# Patient Record
Sex: Male | Born: 1935 | Race: White | Hispanic: No | Smoking: Never smoker
Health system: Southern US, Community
[De-identification: ages and names within clinical notes are randomized; demographics above are authoritative.]

## PROBLEM LIST (undated history)

## (undated) DIAGNOSIS — H9190 Unspecified hearing loss, unspecified ear: Secondary | ICD-10-CM

## (undated) DIAGNOSIS — I10 Essential (primary) hypertension: Secondary | ICD-10-CM

## (undated) HISTORY — PX: CLOSED REDUCTION FEMORAL SHAFT FRACTURE: SUR217

---

## 2014-07-04 DIAGNOSIS — M545 Low back pain: Secondary | ICD-10-CM | POA: Diagnosis not present

## 2014-07-04 DIAGNOSIS — M47896 Other spondylosis, lumbar region: Secondary | ICD-10-CM | POA: Diagnosis not present

## 2014-07-04 DIAGNOSIS — M5416 Radiculopathy, lumbar region: Secondary | ICD-10-CM | POA: Diagnosis not present

## 2014-07-11 DIAGNOSIS — M5116 Intervertebral disc disorders with radiculopathy, lumbar region: Secondary | ICD-10-CM | POA: Diagnosis not present

## 2014-07-11 DIAGNOSIS — M4806 Spinal stenosis, lumbar region: Secondary | ICD-10-CM | POA: Diagnosis not present

## 2014-07-11 DIAGNOSIS — M5125 Other intervertebral disc displacement, thoracolumbar region: Secondary | ICD-10-CM | POA: Diagnosis not present

## 2014-07-27 DIAGNOSIS — M4806 Spinal stenosis, lumbar region: Secondary | ICD-10-CM | POA: Diagnosis not present

## 2014-07-27 DIAGNOSIS — M545 Low back pain: Secondary | ICD-10-CM | POA: Diagnosis not present

## 2014-07-27 DIAGNOSIS — M47896 Other spondylosis, lumbar region: Secondary | ICD-10-CM | POA: Diagnosis not present

## 2014-07-27 DIAGNOSIS — M5416 Radiculopathy, lumbar region: Secondary | ICD-10-CM | POA: Diagnosis not present

## 2014-07-31 DIAGNOSIS — M546 Pain in thoracic spine: Secondary | ICD-10-CM | POA: Diagnosis not present

## 2014-07-31 DIAGNOSIS — M5116 Intervertebral disc disorders with radiculopathy, lumbar region: Secondary | ICD-10-CM | POA: Diagnosis not present

## 2014-07-31 DIAGNOSIS — M47816 Spondylosis without myelopathy or radiculopathy, lumbar region: Secondary | ICD-10-CM | POA: Diagnosis not present

## 2014-07-31 DIAGNOSIS — S338XXA Sprain of other parts of lumbar spine and pelvis, initial encounter: Secondary | ICD-10-CM | POA: Diagnosis not present

## 2014-07-31 DIAGNOSIS — M5441 Lumbago with sciatica, right side: Secondary | ICD-10-CM | POA: Diagnosis not present

## 2014-07-31 DIAGNOSIS — M5136 Other intervertebral disc degeneration, lumbar region: Secondary | ICD-10-CM | POA: Diagnosis not present

## 2014-07-31 DIAGNOSIS — M9903 Segmental and somatic dysfunction of lumbar region: Secondary | ICD-10-CM | POA: Diagnosis not present

## 2014-08-01 DIAGNOSIS — M47816 Spondylosis without myelopathy or radiculopathy, lumbar region: Secondary | ICD-10-CM | POA: Diagnosis not present

## 2014-08-01 DIAGNOSIS — S338XXA Sprain of other parts of lumbar spine and pelvis, initial encounter: Secondary | ICD-10-CM | POA: Diagnosis not present

## 2014-08-01 DIAGNOSIS — M5116 Intervertebral disc disorders with radiculopathy, lumbar region: Secondary | ICD-10-CM | POA: Diagnosis not present

## 2014-08-01 DIAGNOSIS — M546 Pain in thoracic spine: Secondary | ICD-10-CM | POA: Diagnosis not present

## 2014-08-01 DIAGNOSIS — M9903 Segmental and somatic dysfunction of lumbar region: Secondary | ICD-10-CM | POA: Diagnosis not present

## 2014-08-01 DIAGNOSIS — M5136 Other intervertebral disc degeneration, lumbar region: Secondary | ICD-10-CM | POA: Diagnosis not present

## 2014-08-01 DIAGNOSIS — M5441 Lumbago with sciatica, right side: Secondary | ICD-10-CM | POA: Diagnosis not present

## 2014-08-03 DIAGNOSIS — S338XXA Sprain of other parts of lumbar spine and pelvis, initial encounter: Secondary | ICD-10-CM | POA: Diagnosis not present

## 2014-08-03 DIAGNOSIS — M9903 Segmental and somatic dysfunction of lumbar region: Secondary | ICD-10-CM | POA: Diagnosis not present

## 2014-08-03 DIAGNOSIS — M5116 Intervertebral disc disorders with radiculopathy, lumbar region: Secondary | ICD-10-CM | POA: Diagnosis not present

## 2014-08-03 DIAGNOSIS — M5136 Other intervertebral disc degeneration, lumbar region: Secondary | ICD-10-CM | POA: Diagnosis not present

## 2014-08-03 DIAGNOSIS — M47816 Spondylosis without myelopathy or radiculopathy, lumbar region: Secondary | ICD-10-CM | POA: Diagnosis not present

## 2014-08-03 DIAGNOSIS — M5441 Lumbago with sciatica, right side: Secondary | ICD-10-CM | POA: Diagnosis not present

## 2014-08-03 DIAGNOSIS — M546 Pain in thoracic spine: Secondary | ICD-10-CM | POA: Diagnosis not present

## 2014-08-07 DIAGNOSIS — M47816 Spondylosis without myelopathy or radiculopathy, lumbar region: Secondary | ICD-10-CM | POA: Diagnosis not present

## 2014-08-07 DIAGNOSIS — M9903 Segmental and somatic dysfunction of lumbar region: Secondary | ICD-10-CM | POA: Diagnosis not present

## 2014-08-07 DIAGNOSIS — M5116 Intervertebral disc disorders with radiculopathy, lumbar region: Secondary | ICD-10-CM | POA: Diagnosis not present

## 2014-08-07 DIAGNOSIS — M5441 Lumbago with sciatica, right side: Secondary | ICD-10-CM | POA: Diagnosis not present

## 2014-10-26 DIAGNOSIS — M9903 Segmental and somatic dysfunction of lumbar region: Secondary | ICD-10-CM | POA: Diagnosis not present

## 2014-10-26 DIAGNOSIS — M47816 Spondylosis without myelopathy or radiculopathy, lumbar region: Secondary | ICD-10-CM | POA: Diagnosis not present

## 2014-10-26 DIAGNOSIS — M5116 Intervertebral disc disorders with radiculopathy, lumbar region: Secondary | ICD-10-CM | POA: Diagnosis not present

## 2014-10-26 DIAGNOSIS — M5136 Other intervertebral disc degeneration, lumbar region: Secondary | ICD-10-CM | POA: Diagnosis not present

## 2014-10-26 DIAGNOSIS — M5441 Lumbago with sciatica, right side: Secondary | ICD-10-CM | POA: Diagnosis not present

## 2014-10-30 DIAGNOSIS — M5416 Radiculopathy, lumbar region: Secondary | ICD-10-CM | POA: Diagnosis not present

## 2014-10-30 DIAGNOSIS — M5431 Sciatica, right side: Secondary | ICD-10-CM | POA: Diagnosis not present

## 2014-10-30 DIAGNOSIS — M4800 Spinal stenosis, site unspecified: Secondary | ICD-10-CM | POA: Diagnosis not present

## 2014-10-31 DIAGNOSIS — M5116 Intervertebral disc disorders with radiculopathy, lumbar region: Secondary | ICD-10-CM | POA: Diagnosis not present

## 2014-10-31 DIAGNOSIS — M47816 Spondylosis without myelopathy or radiculopathy, lumbar region: Secondary | ICD-10-CM | POA: Diagnosis not present

## 2014-10-31 DIAGNOSIS — M9903 Segmental and somatic dysfunction of lumbar region: Secondary | ICD-10-CM | POA: Diagnosis not present

## 2014-10-31 DIAGNOSIS — S338XXA Sprain of other parts of lumbar spine and pelvis, initial encounter: Secondary | ICD-10-CM | POA: Diagnosis not present

## 2014-10-31 DIAGNOSIS — M5441 Lumbago with sciatica, right side: Secondary | ICD-10-CM | POA: Diagnosis not present

## 2014-10-31 DIAGNOSIS — M5136 Other intervertebral disc degeneration, lumbar region: Secondary | ICD-10-CM | POA: Diagnosis not present

## 2014-11-07 DIAGNOSIS — M5441 Lumbago with sciatica, right side: Secondary | ICD-10-CM | POA: Diagnosis not present

## 2014-11-07 DIAGNOSIS — M9903 Segmental and somatic dysfunction of lumbar region: Secondary | ICD-10-CM | POA: Diagnosis not present

## 2014-11-07 DIAGNOSIS — S338XXA Sprain of other parts of lumbar spine and pelvis, initial encounter: Secondary | ICD-10-CM | POA: Diagnosis not present

## 2014-11-07 DIAGNOSIS — M5136 Other intervertebral disc degeneration, lumbar region: Secondary | ICD-10-CM | POA: Diagnosis not present

## 2014-11-07 DIAGNOSIS — M47816 Spondylosis without myelopathy or radiculopathy, lumbar region: Secondary | ICD-10-CM | POA: Diagnosis not present

## 2014-11-07 DIAGNOSIS — M5116 Intervertebral disc disorders with radiculopathy, lumbar region: Secondary | ICD-10-CM | POA: Diagnosis not present

## 2014-11-09 DIAGNOSIS — M5441 Lumbago with sciatica, right side: Secondary | ICD-10-CM | POA: Diagnosis not present

## 2014-11-09 DIAGNOSIS — M5136 Other intervertebral disc degeneration, lumbar region: Secondary | ICD-10-CM | POA: Diagnosis not present

## 2014-11-09 DIAGNOSIS — M47816 Spondylosis without myelopathy or radiculopathy, lumbar region: Secondary | ICD-10-CM | POA: Diagnosis not present

## 2014-11-09 DIAGNOSIS — M9903 Segmental and somatic dysfunction of lumbar region: Secondary | ICD-10-CM | POA: Diagnosis not present

## 2014-11-09 DIAGNOSIS — S338XXA Sprain of other parts of lumbar spine and pelvis, initial encounter: Secondary | ICD-10-CM | POA: Diagnosis not present

## 2014-11-09 DIAGNOSIS — M5116 Intervertebral disc disorders with radiculopathy, lumbar region: Secondary | ICD-10-CM | POA: Diagnosis not present

## 2014-11-14 DIAGNOSIS — M5116 Intervertebral disc disorders with radiculopathy, lumbar region: Secondary | ICD-10-CM | POA: Diagnosis not present

## 2014-11-14 DIAGNOSIS — M47816 Spondylosis without myelopathy or radiculopathy, lumbar region: Secondary | ICD-10-CM | POA: Diagnosis not present

## 2014-11-14 DIAGNOSIS — M5136 Other intervertebral disc degeneration, lumbar region: Secondary | ICD-10-CM | POA: Diagnosis not present

## 2014-11-14 DIAGNOSIS — M5441 Lumbago with sciatica, right side: Secondary | ICD-10-CM | POA: Diagnosis not present

## 2014-11-14 DIAGNOSIS — M9903 Segmental and somatic dysfunction of lumbar region: Secondary | ICD-10-CM | POA: Diagnosis not present

## 2014-11-14 DIAGNOSIS — S338XXA Sprain of other parts of lumbar spine and pelvis, initial encounter: Secondary | ICD-10-CM | POA: Diagnosis not present

## 2014-11-16 DIAGNOSIS — M9903 Segmental and somatic dysfunction of lumbar region: Secondary | ICD-10-CM | POA: Diagnosis not present

## 2014-11-16 DIAGNOSIS — S338XXA Sprain of other parts of lumbar spine and pelvis, initial encounter: Secondary | ICD-10-CM | POA: Diagnosis not present

## 2014-11-16 DIAGNOSIS — M5116 Intervertebral disc disorders with radiculopathy, lumbar region: Secondary | ICD-10-CM | POA: Diagnosis not present

## 2014-11-16 DIAGNOSIS — M546 Pain in thoracic spine: Secondary | ICD-10-CM | POA: Diagnosis not present

## 2014-11-16 DIAGNOSIS — M5441 Lumbago with sciatica, right side: Secondary | ICD-10-CM | POA: Diagnosis not present

## 2014-11-16 DIAGNOSIS — M47816 Spondylosis without myelopathy or radiculopathy, lumbar region: Secondary | ICD-10-CM | POA: Diagnosis not present

## 2014-11-16 DIAGNOSIS — M5136 Other intervertebral disc degeneration, lumbar region: Secondary | ICD-10-CM | POA: Diagnosis not present

## 2014-11-21 DIAGNOSIS — M9903 Segmental and somatic dysfunction of lumbar region: Secondary | ICD-10-CM | POA: Diagnosis not present

## 2014-11-21 DIAGNOSIS — M5116 Intervertebral disc disorders with radiculopathy, lumbar region: Secondary | ICD-10-CM | POA: Diagnosis not present

## 2014-11-21 DIAGNOSIS — M5136 Other intervertebral disc degeneration, lumbar region: Secondary | ICD-10-CM | POA: Diagnosis not present

## 2014-11-21 DIAGNOSIS — M5441 Lumbago with sciatica, right side: Secondary | ICD-10-CM | POA: Diagnosis not present

## 2014-11-21 DIAGNOSIS — S338XXA Sprain of other parts of lumbar spine and pelvis, initial encounter: Secondary | ICD-10-CM | POA: Diagnosis not present

## 2014-11-21 DIAGNOSIS — M47816 Spondylosis without myelopathy or radiculopathy, lumbar region: Secondary | ICD-10-CM | POA: Diagnosis not present

## 2014-11-21 DIAGNOSIS — M546 Pain in thoracic spine: Secondary | ICD-10-CM | POA: Diagnosis not present

## 2014-11-23 DIAGNOSIS — S338XXA Sprain of other parts of lumbar spine and pelvis, initial encounter: Secondary | ICD-10-CM | POA: Diagnosis not present

## 2014-11-23 DIAGNOSIS — M5441 Lumbago with sciatica, right side: Secondary | ICD-10-CM | POA: Diagnosis not present

## 2014-11-23 DIAGNOSIS — M47816 Spondylosis without myelopathy or radiculopathy, lumbar region: Secondary | ICD-10-CM | POA: Diagnosis not present

## 2014-11-23 DIAGNOSIS — M546 Pain in thoracic spine: Secondary | ICD-10-CM | POA: Diagnosis not present

## 2014-11-23 DIAGNOSIS — M5136 Other intervertebral disc degeneration, lumbar region: Secondary | ICD-10-CM | POA: Diagnosis not present

## 2014-11-23 DIAGNOSIS — M9903 Segmental and somatic dysfunction of lumbar region: Secondary | ICD-10-CM | POA: Diagnosis not present

## 2014-11-23 DIAGNOSIS — M5116 Intervertebral disc disorders with radiculopathy, lumbar region: Secondary | ICD-10-CM | POA: Diagnosis not present

## 2014-12-05 DIAGNOSIS — M5136 Other intervertebral disc degeneration, lumbar region: Secondary | ICD-10-CM | POA: Diagnosis not present

## 2014-12-05 DIAGNOSIS — M9903 Segmental and somatic dysfunction of lumbar region: Secondary | ICD-10-CM | POA: Diagnosis not present

## 2014-12-05 DIAGNOSIS — M5441 Lumbago with sciatica, right side: Secondary | ICD-10-CM | POA: Diagnosis not present

## 2014-12-05 DIAGNOSIS — M47816 Spondylosis without myelopathy or radiculopathy, lumbar region: Secondary | ICD-10-CM | POA: Diagnosis not present

## 2014-12-05 DIAGNOSIS — M546 Pain in thoracic spine: Secondary | ICD-10-CM | POA: Diagnosis not present

## 2014-12-05 DIAGNOSIS — S338XXA Sprain of other parts of lumbar spine and pelvis, initial encounter: Secondary | ICD-10-CM | POA: Diagnosis not present

## 2014-12-05 DIAGNOSIS — M5116 Intervertebral disc disorders with radiculopathy, lumbar region: Secondary | ICD-10-CM | POA: Diagnosis not present

## 2014-12-07 DIAGNOSIS — S338XXA Sprain of other parts of lumbar spine and pelvis, initial encounter: Secondary | ICD-10-CM | POA: Diagnosis not present

## 2014-12-07 DIAGNOSIS — M5136 Other intervertebral disc degeneration, lumbar region: Secondary | ICD-10-CM | POA: Diagnosis not present

## 2014-12-07 DIAGNOSIS — M5441 Lumbago with sciatica, right side: Secondary | ICD-10-CM | POA: Diagnosis not present

## 2014-12-07 DIAGNOSIS — M9903 Segmental and somatic dysfunction of lumbar region: Secondary | ICD-10-CM | POA: Diagnosis not present

## 2014-12-07 DIAGNOSIS — M47816 Spondylosis without myelopathy or radiculopathy, lumbar region: Secondary | ICD-10-CM | POA: Diagnosis not present

## 2014-12-07 DIAGNOSIS — M5116 Intervertebral disc disorders with radiculopathy, lumbar region: Secondary | ICD-10-CM | POA: Diagnosis not present

## 2014-12-07 DIAGNOSIS — M546 Pain in thoracic spine: Secondary | ICD-10-CM | POA: Diagnosis not present

## 2014-12-12 DIAGNOSIS — M5136 Other intervertebral disc degeneration, lumbar region: Secondary | ICD-10-CM | POA: Diagnosis not present

## 2014-12-12 DIAGNOSIS — S338XXA Sprain of other parts of lumbar spine and pelvis, initial encounter: Secondary | ICD-10-CM | POA: Diagnosis not present

## 2014-12-12 DIAGNOSIS — M5441 Lumbago with sciatica, right side: Secondary | ICD-10-CM | POA: Diagnosis not present

## 2014-12-12 DIAGNOSIS — M5116 Intervertebral disc disorders with radiculopathy, lumbar region: Secondary | ICD-10-CM | POA: Diagnosis not present

## 2014-12-12 DIAGNOSIS — M9903 Segmental and somatic dysfunction of lumbar region: Secondary | ICD-10-CM | POA: Diagnosis not present

## 2014-12-12 DIAGNOSIS — M546 Pain in thoracic spine: Secondary | ICD-10-CM | POA: Diagnosis not present

## 2014-12-12 DIAGNOSIS — M47816 Spondylosis without myelopathy or radiculopathy, lumbar region: Secondary | ICD-10-CM | POA: Diagnosis not present

## 2015-06-21 DIAGNOSIS — R0602 Shortness of breath: Secondary | ICD-10-CM | POA: Diagnosis not present

## 2015-06-21 DIAGNOSIS — I509 Heart failure, unspecified: Secondary | ICD-10-CM | POA: Diagnosis not present

## 2015-11-17 DIAGNOSIS — M545 Low back pain: Secondary | ICD-10-CM | POA: Diagnosis not present

## 2015-11-17 DIAGNOSIS — M4806 Spinal stenosis, lumbar region: Secondary | ICD-10-CM | POA: Diagnosis not present

## 2015-11-17 DIAGNOSIS — M5441 Lumbago with sciatica, right side: Secondary | ICD-10-CM | POA: Diagnosis not present

## 2015-11-17 DIAGNOSIS — R030 Elevated blood-pressure reading, without diagnosis of hypertension: Secondary | ICD-10-CM | POA: Diagnosis not present

## 2016-01-10 DIAGNOSIS — M545 Low back pain: Secondary | ICD-10-CM | POA: Diagnosis not present

## 2016-01-10 DIAGNOSIS — Z6830 Body mass index (BMI) 30.0-30.9, adult: Secondary | ICD-10-CM | POA: Diagnosis not present

## 2016-01-10 DIAGNOSIS — M5431 Sciatica, right side: Secondary | ICD-10-CM | POA: Diagnosis not present

## 2016-01-10 DIAGNOSIS — I10 Essential (primary) hypertension: Secondary | ICD-10-CM | POA: Diagnosis not present

## 2016-02-29 ENCOUNTER — Other Ambulatory Visit: Payer: Self-pay | Admitting: Internal Medicine

## 2016-02-29 DIAGNOSIS — G8929 Other chronic pain: Secondary | ICD-10-CM

## 2016-02-29 DIAGNOSIS — M545 Low back pain: Principal | ICD-10-CM

## 2016-03-12 ENCOUNTER — Ambulatory Visit
Admission: RE | Admit: 2016-03-12 | Discharge: 2016-03-12 | Disposition: A | Discharge status: Home / Self Care | Payer: Medicare Other | Source: Ambulatory Visit | Attending: Internal Medicine | Admitting: Internal Medicine

## 2016-03-12 DIAGNOSIS — G8929 Other chronic pain: Secondary | ICD-10-CM

## 2016-03-12 DIAGNOSIS — M545 Low back pain, unspecified: Secondary | ICD-10-CM

## 2016-03-12 DIAGNOSIS — M47817 Spondylosis without myelopathy or radiculopathy, lumbosacral region: Secondary | ICD-10-CM | POA: Diagnosis not present

## 2016-03-12 MED ORDER — IOPAMIDOL (ISOVUE-M 200) INJECTION 41%
1.0000 mL | Freq: Once | INTRAMUSCULAR | Status: AC
  Administered 2016-03-12: 1 mL via EPIDURAL

## 2016-03-12 MED ORDER — METHYLPREDNISOLONE ACETATE 40 MG/ML INJ SUSP (RADIOLOG
120.0000 mg | Freq: Once | INTRAMUSCULAR | Status: AC
  Administered 2016-03-12: 120 mg via EPIDURAL

## 2016-03-12 NOTE — Discharge Instructions (Signed)

## 2016-06-10 ENCOUNTER — Other Ambulatory Visit: Payer: Self-pay | Admitting: Internal Medicine

## 2016-06-10 DIAGNOSIS — G8929 Other chronic pain: Secondary | ICD-10-CM

## 2016-06-10 DIAGNOSIS — M545 Low back pain: Principal | ICD-10-CM

## 2016-06-20 ENCOUNTER — Ambulatory Visit
Admission: RE | Admit: 2016-06-20 | Discharge: 2016-06-20 | Disposition: A | Discharge status: Home / Self Care | Payer: Medicare Other | Source: Ambulatory Visit | Attending: Internal Medicine | Admitting: Internal Medicine

## 2016-06-20 DIAGNOSIS — M545 Low back pain: Principal | ICD-10-CM

## 2016-06-20 DIAGNOSIS — G8929 Other chronic pain: Secondary | ICD-10-CM

## 2016-06-20 MED ORDER — IOPAMIDOL (ISOVUE-M 200) INJECTION 41%
1.0000 mL | Freq: Once | INTRAMUSCULAR | Status: AC
  Administered 2016-06-20: 1 mL via EPIDURAL

## 2016-06-20 MED ORDER — METHYLPREDNISOLONE ACETATE 40 MG/ML INJ SUSP (RADIOLOG
120.0000 mg | Freq: Once | INTRAMUSCULAR | Status: AC
  Administered 2016-06-20: 120 mg via EPIDURAL

## 2016-06-20 NOTE — Discharge Instructions (Signed)

## 2016-07-31 DIAGNOSIS — M5431 Sciatica, right side: Secondary | ICD-10-CM | POA: Diagnosis not present

## 2016-07-31 DIAGNOSIS — M545 Low back pain: Secondary | ICD-10-CM | POA: Diagnosis not present

## 2016-07-31 DIAGNOSIS — I10 Essential (primary) hypertension: Secondary | ICD-10-CM | POA: Diagnosis not present

## 2016-07-31 DIAGNOSIS — Z6830 Body mass index (BMI) 30.0-30.9, adult: Secondary | ICD-10-CM | POA: Diagnosis not present

## 2016-09-10 ENCOUNTER — Other Ambulatory Visit: Payer: Self-pay | Admitting: Internal Medicine

## 2016-09-10 DIAGNOSIS — M545 Low back pain: Secondary | ICD-10-CM

## 2016-09-15 ENCOUNTER — Ambulatory Visit
Admission: RE | Admit: 2016-09-15 | Discharge: 2016-09-15 | Disposition: A | Discharge status: Home / Self Care | Payer: Medicare Other | Source: Ambulatory Visit | Attending: Internal Medicine | Admitting: Internal Medicine

## 2016-09-15 DIAGNOSIS — M545 Low back pain: Secondary | ICD-10-CM

## 2016-09-15 DIAGNOSIS — M47817 Spondylosis without myelopathy or radiculopathy, lumbosacral region: Secondary | ICD-10-CM | POA: Diagnosis not present

## 2016-09-15 MED ORDER — IOPAMIDOL (ISOVUE-M 200) INJECTION 41%
1.0000 mL | Freq: Once | INTRAMUSCULAR | Status: AC
  Administered 2016-09-15: 1 mL via EPIDURAL

## 2016-09-15 MED ORDER — METHYLPREDNISOLONE ACETATE 40 MG/ML INJ SUSP (RADIOLOG
120.0000 mg | Freq: Once | INTRAMUSCULAR | Status: AC
  Administered 2016-09-15: 120 mg via EPIDURAL

## 2016-09-15 NOTE — Discharge Instructions (Signed)

## 2016-11-14 DIAGNOSIS — M25562 Pain in left knee: Secondary | ICD-10-CM | POA: Diagnosis not present

## 2016-11-14 DIAGNOSIS — B358 Other dermatophytoses: Secondary | ICD-10-CM | POA: Diagnosis not present

## 2016-11-14 DIAGNOSIS — Z6829 Body mass index (BMI) 29.0-29.9, adult: Secondary | ICD-10-CM | POA: Diagnosis not present

## 2016-11-14 DIAGNOSIS — I10 Essential (primary) hypertension: Secondary | ICD-10-CM | POA: Diagnosis not present

## 2016-12-05 DIAGNOSIS — M545 Low back pain: Secondary | ICD-10-CM | POA: Diagnosis not present

## 2016-12-05 DIAGNOSIS — L03113 Cellulitis of right upper limb: Secondary | ICD-10-CM | POA: Diagnosis not present

## 2016-12-05 DIAGNOSIS — F411 Generalized anxiety disorder: Secondary | ICD-10-CM | POA: Diagnosis present

## 2016-12-05 DIAGNOSIS — Z79899 Other long term (current) drug therapy: Secondary | ICD-10-CM | POA: Diagnosis not present

## 2016-12-05 DIAGNOSIS — L02511 Cutaneous abscess of right hand: Secondary | ICD-10-CM | POA: Diagnosis not present

## 2016-12-05 DIAGNOSIS — Z823 Family history of stroke: Secondary | ICD-10-CM | POA: Diagnosis not present

## 2016-12-05 DIAGNOSIS — G8929 Other chronic pain: Secondary | ICD-10-CM | POA: Diagnosis not present

## 2016-12-05 DIAGNOSIS — S61201A Unspecified open wound of left index finger without damage to nail, initial encounter: Secondary | ICD-10-CM | POA: Diagnosis not present

## 2016-12-05 DIAGNOSIS — I10 Essential (primary) hypertension: Secondary | ICD-10-CM | POA: Diagnosis not present

## 2016-12-05 DIAGNOSIS — Z8042 Family history of malignant neoplasm of prostate: Secondary | ICD-10-CM | POA: Diagnosis not present

## 2016-12-16 DIAGNOSIS — G4739 Other sleep apnea: Secondary | ICD-10-CM | POA: Diagnosis not present

## 2016-12-16 DIAGNOSIS — L02511 Cutaneous abscess of right hand: Secondary | ICD-10-CM | POA: Diagnosis not present

## 2017-01-14 DIAGNOSIS — G4730 Sleep apnea, unspecified: Secondary | ICD-10-CM | POA: Diagnosis not present

## 2017-01-16 DIAGNOSIS — G4730 Sleep apnea, unspecified: Secondary | ICD-10-CM | POA: Diagnosis not present

## 2017-02-12 DIAGNOSIS — G4733 Obstructive sleep apnea (adult) (pediatric): Secondary | ICD-10-CM | POA: Diagnosis not present

## 2017-05-07 DIAGNOSIS — I10 Essential (primary) hypertension: Secondary | ICD-10-CM | POA: Diagnosis not present

## 2017-05-07 DIAGNOSIS — G4739 Other sleep apnea: Secondary | ICD-10-CM | POA: Diagnosis not present

## 2017-09-16 DIAGNOSIS — I10 Essential (primary) hypertension: Secondary | ICD-10-CM | POA: Diagnosis not present

## 2017-09-16 DIAGNOSIS — G4739 Other sleep apnea: Secondary | ICD-10-CM | POA: Diagnosis not present

## 2017-09-16 DIAGNOSIS — M25552 Pain in left hip: Secondary | ICD-10-CM | POA: Diagnosis not present

## 2017-12-24 DIAGNOSIS — Z0000 Encounter for general adult medical examination without abnormal findings: Secondary | ICD-10-CM | POA: Diagnosis not present

## 2017-12-24 DIAGNOSIS — Z125 Encounter for screening for malignant neoplasm of prostate: Secondary | ICD-10-CM | POA: Diagnosis not present

## 2017-12-24 DIAGNOSIS — M25552 Pain in left hip: Secondary | ICD-10-CM | POA: Diagnosis not present

## 2017-12-24 DIAGNOSIS — I10 Essential (primary) hypertension: Secondary | ICD-10-CM | POA: Diagnosis not present

## 2017-12-24 DIAGNOSIS — G4739 Other sleep apnea: Secondary | ICD-10-CM | POA: Diagnosis not present

## 2017-12-24 DIAGNOSIS — L043 Acute lymphadenitis of lower limb: Secondary | ICD-10-CM | POA: Diagnosis not present

## 2018-06-12 DIAGNOSIS — J019 Acute sinusitis, unspecified: Secondary | ICD-10-CM | POA: Diagnosis not present

## 2018-06-12 DIAGNOSIS — Z0131 Encounter for examination of blood pressure with abnormal findings: Secondary | ICD-10-CM | POA: Diagnosis not present

## 2018-06-12 DIAGNOSIS — J209 Acute bronchitis, unspecified: Secondary | ICD-10-CM | POA: Diagnosis not present

## 2018-06-27 DIAGNOSIS — K5792 Diverticulitis of intestine, part unspecified, without perforation or abscess without bleeding: Secondary | ICD-10-CM | POA: Diagnosis not present

## 2018-06-27 DIAGNOSIS — M160 Bilateral primary osteoarthritis of hip: Secondary | ICD-10-CM | POA: Diagnosis present

## 2018-06-27 DIAGNOSIS — Z79899 Other long term (current) drug therapy: Secondary | ICD-10-CM | POA: Diagnosis not present

## 2018-06-27 DIAGNOSIS — G8929 Other chronic pain: Secondary | ICD-10-CM | POA: Diagnosis not present

## 2018-06-27 DIAGNOSIS — K449 Diaphragmatic hernia without obstruction or gangrene: Secondary | ICD-10-CM | POA: Diagnosis not present

## 2018-06-27 DIAGNOSIS — R911 Solitary pulmonary nodule: Secondary | ICD-10-CM | POA: Diagnosis present

## 2018-06-27 DIAGNOSIS — M25551 Pain in right hip: Secondary | ICD-10-CM | POA: Diagnosis not present

## 2018-06-27 DIAGNOSIS — I10 Essential (primary) hypertension: Secondary | ICD-10-CM | POA: Diagnosis not present

## 2018-06-27 DIAGNOSIS — K5732 Diverticulitis of large intestine without perforation or abscess without bleeding: Secondary | ICD-10-CM | POA: Diagnosis not present

## 2018-06-27 DIAGNOSIS — K5720 Diverticulitis of large intestine with perforation and abscess without bleeding: Secondary | ICD-10-CM | POA: Diagnosis not present

## 2018-06-27 DIAGNOSIS — K8020 Calculus of gallbladder without cholecystitis without obstruction: Secondary | ICD-10-CM | POA: Diagnosis not present

## 2018-07-15 DIAGNOSIS — K5741 Diverticulitis of both small and large intestine with perforation and abscess with bleeding: Secondary | ICD-10-CM | POA: Diagnosis not present

## 2018-11-11 DIAGNOSIS — M545 Low back pain: Secondary | ICD-10-CM | POA: Diagnosis not present

## 2019-01-31 ENCOUNTER — Other Ambulatory Visit: Payer: Self-pay

## 2019-03-09 DIAGNOSIS — E782 Mixed hyperlipidemia: Secondary | ICD-10-CM | POA: Diagnosis not present

## 2019-03-09 DIAGNOSIS — I10 Essential (primary) hypertension: Secondary | ICD-10-CM | POA: Diagnosis not present

## 2019-03-09 DIAGNOSIS — M545 Low back pain: Secondary | ICD-10-CM | POA: Diagnosis not present

## 2019-03-22 DIAGNOSIS — M545 Low back pain: Secondary | ICD-10-CM | POA: Diagnosis not present

## 2019-03-22 DIAGNOSIS — E782 Mixed hyperlipidemia: Secondary | ICD-10-CM | POA: Diagnosis not present

## 2019-03-22 DIAGNOSIS — M1711 Unilateral primary osteoarthritis, right knee: Secondary | ICD-10-CM | POA: Diagnosis not present

## 2019-03-22 DIAGNOSIS — I10 Essential (primary) hypertension: Secondary | ICD-10-CM | POA: Diagnosis not present

## 2019-03-25 ENCOUNTER — Other Ambulatory Visit: Payer: Self-pay | Admitting: Internal Medicine

## 2019-03-25 DIAGNOSIS — M545 Low back pain, unspecified: Secondary | ICD-10-CM

## 2019-03-25 DIAGNOSIS — I10 Essential (primary) hypertension: Secondary | ICD-10-CM | POA: Diagnosis not present

## 2019-03-25 DIAGNOSIS — M1711 Unilateral primary osteoarthritis, right knee: Secondary | ICD-10-CM | POA: Diagnosis not present

## 2019-03-25 DIAGNOSIS — E782 Mixed hyperlipidemia: Secondary | ICD-10-CM | POA: Diagnosis not present

## 2019-03-25 DIAGNOSIS — Z131 Encounter for screening for diabetes mellitus: Secondary | ICD-10-CM | POA: Diagnosis not present

## 2019-03-25 DIAGNOSIS — G8929 Other chronic pain: Secondary | ICD-10-CM

## 2019-03-25 DIAGNOSIS — Z125 Encounter for screening for malignant neoplasm of prostate: Secondary | ICD-10-CM | POA: Diagnosis not present

## 2019-03-31 ENCOUNTER — Ambulatory Visit
Admission: RE | Admit: 2019-03-31 | Discharge: 2019-03-31 | Disposition: A | Discharge status: Home / Self Care | Payer: Medicare Other | Source: Ambulatory Visit | Attending: Internal Medicine | Admitting: Internal Medicine

## 2019-03-31 DIAGNOSIS — G8929 Other chronic pain: Secondary | ICD-10-CM

## 2019-03-31 DIAGNOSIS — M545 Low back pain, unspecified: Secondary | ICD-10-CM

## 2019-03-31 MED ORDER — IOPAMIDOL (ISOVUE-M 200) INJECTION 41%
1.0000 mL | Freq: Once | INTRAMUSCULAR | Status: AC
  Administered 2019-03-31: 15:00:00 1 mL via EPIDURAL

## 2019-03-31 MED ORDER — METHYLPREDNISOLONE ACETATE 40 MG/ML INJ SUSP (RADIOLOG
120.0000 mg | Freq: Once | INTRAMUSCULAR | Status: AC
  Administered 2019-03-31: 15:00:00 120 mg via EPIDURAL

## 2019-03-31 NOTE — Discharge Instructions (Signed)

## 2019-04-11 DIAGNOSIS — M25561 Pain in right knee: Secondary | ICD-10-CM | POA: Diagnosis not present

## 2019-04-11 DIAGNOSIS — M25562 Pain in left knee: Secondary | ICD-10-CM | POA: Diagnosis not present

## 2019-05-12 DIAGNOSIS — M545 Low back pain: Secondary | ICD-10-CM | POA: Diagnosis not present

## 2019-05-12 DIAGNOSIS — M1711 Unilateral primary osteoarthritis, right knee: Secondary | ICD-10-CM | POA: Diagnosis not present

## 2019-05-12 DIAGNOSIS — N5201 Erectile dysfunction due to arterial insufficiency: Secondary | ICD-10-CM | POA: Diagnosis not present

## 2019-05-12 DIAGNOSIS — I10 Essential (primary) hypertension: Secondary | ICD-10-CM | POA: Diagnosis not present

## 2019-05-12 DIAGNOSIS — M25561 Pain in right knee: Secondary | ICD-10-CM | POA: Diagnosis not present

## 2019-05-12 DIAGNOSIS — E782 Mixed hyperlipidemia: Secondary | ICD-10-CM | POA: Diagnosis not present

## 2019-05-19 ENCOUNTER — Ambulatory Visit: Payer: Self-pay | Admitting: Physician Assistant

## 2019-05-19 DIAGNOSIS — I10 Essential (primary) hypertension: Secondary | ICD-10-CM | POA: Diagnosis not present

## 2019-05-19 DIAGNOSIS — Z1389 Encounter for screening for other disorder: Secondary | ICD-10-CM | POA: Diagnosis not present

## 2019-05-19 DIAGNOSIS — Z0000 Encounter for general adult medical examination without abnormal findings: Secondary | ICD-10-CM | POA: Diagnosis not present

## 2019-05-19 NOTE — H&P (Signed)
TOTAL KNEE ADMISSION H&P  Patient is being admitted for right total knee arthroplasty.  Subjective:  Chief Complaint:right knee pain.  HPI: Devin [name], 83 y.o. male, has a history of pain and functional disability in the right knee due to arthritis and has failed non-surgical conservative treatments for greater than 12 weeks to includeNSAID's and/or analgesics, corticosteriod injections, use of assistive devices and activity modification.  Onset of symptoms was gradual, starting >10 years ago with gradually worsening course since that time. The patient noted no past surgery on the right knee(s).  Patient currently rates pain in the right knee(s) at 10 out of 10 with activity. Patient has night pain, worsening of pain with activity and weight bearing, pain that interferes with activities of daily living, pain with passive range of motion, crepitus and joint swelling.  Patient has evidence of subchondral cysts, subchondral sclerosis, periarticular osteophytes, joint subluxation and joint space narrowing by imaging studies.  There is no active infection.  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 amLODipine (NORVASC) 5 MG tablet Take 5 mg by mouth daily.     . chlorthalidone (HYGROTON) 25 MG tablet Take 25 mg by mouth daily.     Marland Kitchen HYDROcodone-acetaminophen (NORCO/VICODIN) 5-325 MG tablet Take 1 tablet by mouth every 6 (six) hours as needed.     . metoprolol succinate (TOPROL-XL) 100 MG 24 hr tablet Take 100 mg by mouth daily.      No current facility-administered medications for this visit.    No Known Allergies  Social History   Tobacco Use  . Smoking status: Not on file  Substance Use Topics  . Alcohol use: Not on file    No family history on file.   Review of Systems   Musculoskeletal: Positive for joint pain.  All other systems reviewed and are negative.   Objective:  Physical Exam  Constitutional: He is oriented to person, place, and time. He appears well-developed and well-nourished. No distress.  HENT:  Head: Normocephalic and atraumatic.  Eyes: Pupils are equal, round, and reactive to light. Conjunctivae and EOM are normal.  Neck: Normal range of motion. Neck supple.  Cardiovascular: Normal rate, regular rhythm, normal heart sounds and intact distal pulses.  Respiratory: Effort normal and breath sounds normal. No respiratory distress. He has no wheezes.  GI: Soft. Bowel sounds are normal. He exhibits no distension. There is no abdominal tenderness.  Musculoskeletal:     Right knee: He exhibits decreased range of motion, swelling, deformity and bony tenderness. He exhibits no erythema. Tenderness found.  Lymphadenopathy:    He has no cervical adenopathy.  Neurological: He is alert and oriented to person, place, and time.  Skin: Skin is warm and dry. No rash noted. No erythema.  Psychiatric: He has a normal mood and affect. His behavior is normal.    Vital signs in last 24 hours: @VSRANGES @  Labs:   There is no height or weight on file to calculate BMI.   Imaging Review Plain radiographs demonstrate severe degenerative joint disease of the right knee(s). The overall alignment issignificant valgus. The bone quality appears to be good for age and reported activity level.      Assessment/Plan:  End stage arthritis, right knee   The patient history, physical examination, clinical judgment of the provider and  imaging studies are consistent with end stage degenerative joint disease of the right knee(s) and total knee arthroplasty is deemed medically necessary. The treatment options including medical management, injection therapy arthroscopy and arthroplasty were discussed at length. The risks and benefits of total knee arthroplasty were  presented and reviewed. The risks due to aseptic loosening, infection, stiffness, patella tracking problems, thromboembolic complications and other imponderables were discussed. The patient acknowledged the explanation, agreed to proceed with the plan and consent was signed. Patient is being admitted for inpatient treatment for surgery, pain control, PT, OT, prophylactic antibiotics, VTE prophylaxis, progressive ambulation and ADL's and discharge planning. The patient is planning to be discharged home with home health services    Anticipated LOS equal to or greater than 2 midnights due to - Age 19 and older with one or more of the following:  - Obesity  - Expected need for hospital services (PT, OT, Nursing) required for safe  discharge  - Anticipated need for postoperative skilled nursing care or inpatient rehab  - Active co-morbidities: None OR   - Unanticipated findings during/Post Surgery: None  - Patient is a high risk of re-admission due to: None

## 2019-05-19 NOTE — H&P (View-Only) (Signed)
TOTAL KNEE ADMISSION H&P  Patient is being admitted for right total knee arthroplasty.  Subjective:  Chief Complaint:right knee pain.  HPI: Devin [name], 83 y.o. male, has a history of pain and functional disability in the right knee due to arthritis and has failed non-surgical conservative treatments for greater than 12 weeks to includeNSAID's and/or analgesics, corticosteriod injections, use of assistive devices and activity modification.  Onset of symptoms was gradual, starting >10 years ago with gradually worsening course since that time. The patient noted no past surgery on the right knee(s).  Patient currently rates pain in the right knee(s) at 10 out of 10 with activity. Patient has night pain, worsening of pain with activity and weight bearing, pain that interferes with activities of daily living, pain with passive range of motion, crepitus and joint swelling.  Patient has evidence of subchondral cysts, subchondral sclerosis, periarticular osteophytes, joint subluxation and joint space narrowing by imaging studies.  There is no active infection.  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 amLODipine (NORVASC) 5 MG tablet Take 5 mg by mouth daily.     . chlorthalidone (HYGROTON) 25 MG tablet Take 25 mg by mouth daily.     Marland Kitchen HYDROcodone-acetaminophen (NORCO/VICODIN) 5-325 MG tablet Take 1 tablet by mouth every 6 (six) hours as needed.     . metoprolol succinate (TOPROL-XL) 100 MG 24 hr tablet Take 100 mg by mouth daily.      No current facility-administered medications for this visit.    No Known Allergies  Social History   Tobacco Use  . Smoking status: Not on file  Substance Use Topics  . Alcohol use: Not on file    No family history on file.   Review of Systems   Musculoskeletal: Positive for joint pain.  All other systems reviewed and are negative.   Objective:  Physical Exam  Constitutional: He is oriented to person, place, and time. He appears well-developed and well-nourished. No distress.  HENT:  Head: Normocephalic and atraumatic.  Eyes: Pupils are equal, round, and reactive to light. Conjunctivae and EOM are normal.  Neck: Normal range of motion. Neck supple.  Cardiovascular: Normal rate, regular rhythm, normal heart sounds and intact distal pulses.  Respiratory: Effort normal and breath sounds normal. No respiratory distress. He has no wheezes.  GI: Soft. Bowel sounds are normal. He exhibits no distension. There is no abdominal tenderness.  Musculoskeletal:     Right knee: He exhibits decreased range of motion, swelling, deformity and bony tenderness. He exhibits no erythema. Tenderness found.  Lymphadenopathy:    He has no cervical adenopathy.  Neurological: He is alert and oriented to person, place, and time.  Skin: Skin is warm and dry. No rash noted. No erythema.  Psychiatric: He has a normal mood and affect. His behavior is normal.    Vital signs in last 24 hours: @VSRANGES @  Labs:   There is no height or weight on file to calculate BMI.   Imaging Review Plain radiographs demonstrate severe degenerative joint disease of the right knee(s). The overall alignment issignificant valgus. The bone quality appears to be good for age and reported activity level.      Assessment/Plan:  End stage arthritis, right knee   The patient history, physical examination, clinical judgment of the provider and  imaging studies are consistent with end stage degenerative joint disease of the right knee(s) and total knee arthroplasty is deemed medically necessary. The treatment options including medical management, injection therapy arthroscopy and arthroplasty were discussed at length. The risks and benefits of total knee arthroplasty were  presented and reviewed. The risks due to aseptic loosening, infection, stiffness, patella tracking problems, thromboembolic complications and other imponderables were discussed. The patient acknowledged the explanation, agreed to proceed with the plan and consent was signed. Patient is being admitted for inpatient treatment for surgery, pain control, PT, OT, prophylactic antibiotics, VTE prophylaxis, progressive ambulation and ADL's and discharge planning. The patient is planning to be discharged home with home health services    Anticipated LOS equal to or greater than 2 midnights due to - Age 19 and older with one or more of the following:  - Obesity  - Expected need for hospital services (PT, OT, Nursing) required for safe  discharge  - Anticipated need for postoperative skilled nursing care or inpatient rehab  - Active co-morbidities: None OR   - Unanticipated findings during/Post Surgery: None  - Patient is a high risk of re-admission due to: None

## 2019-05-24 NOTE — Patient Instructions (Addendum)
DUE TO COVID-19 ONLY ONE VISITOR IS ALLOWED TO COME WITH YOU AND STAY IN THE WAITING ROOM ONLY DURING PRE OP AND PROCEDURE. THE ONE VISITOR MAY VISIT WITH YOU IN YOUR PRIVATE ROOM DURING VISITING HOURS ONLY!!   COVID SWAB TESTING MUST BE COMPLETED ON:  12/01/202 @ 2:35 PM at [facility] in [place], [place]   (Must self quarantine after testing. Follow instructions on handout.)              Your procedure is scheduled on: 06/03/2019   Report to [facility] Main  Entrance   Report to Short Stay at: 5:30 AM    Call this number if you have problems the morning of surgery [phone]  NO SOLID FOOD AFTER MIDNIGHT THE NIGHT PRIOR TO SURGERY. NOTHING BY MOUTH EXCEPT CLEAR LIQUIDS UNTIL 4:30 AM . PLEASE FINISH ENSURE DRINK PER SURGEON ORDER  WHICH NEEDS TO BE COMPLETED AT 4:30 AM .   CLEAR LIQUID DIET   Foods Allowed                                                                     Foods Excluded  Coffee and tea, regular and decaf                             liquids that you cannot  Plain Jell-O any favor except red or purple                                           see through such as: Fruit ices (not with fruit pulp)                                     milk, soups, orange juice  Iced Popsicles                                    All solid food Carbonated beverages, regular and diet                                    Cranberry, grape and apple juices Sports drinks like Gatorade Lightly seasoned clear broth or consume(fat free) Sugar, honey syrup  Sample Menu Breakfast                                Lunch                                     Supper Cranberry juice                    Beef broth  Chicken broth Jell-O                                     Grape juice                           Apple juice Coffee or tea                        Jell-O                                      Popsicle                                                Coffee or  tea                        Coffee or tea  _____________________________________________________________________    Brush your teeth the morning of surgery.   Do NOT smoke after Midnight   Take these medicines the morning of surgery with A SIP OF WATER: Amlodipine and Metropolol              You may not have any metal on your body including jewelry, and body piercings              Do not wear lotions, powders, perfumes/cologne, or deodorant              Men may shave face and neck.   Do not bring valuables to the hospital. [facility] IS NOT             RESPONSIBLE   FOR VALUABLES.   Contacts, dentures or bridgework may not be worn into surgery.   Bring small overnight bag day of surgery.    Special Instructions: Bring a copy of your healthcare power of attorney and living will documents         the day of surgery if you haven't scanned them in before.                  - Preparing for Surgery Before surgery, you can play an important role.  Because skin is not sterile, your skin needs to be as free of germs as possible.  You can reduce the number of germs on your skin by washing with CHG (chlorahexidine gluconate) soap before surgery.  CHG is an antiseptic cleaner which kills germs and bonds with the skin to continue killing germs even after washing. Please DO NOT use if you have an allergy to CHG or antibacterial soaps.  If your skin becomes reddened/irritated stop using the CHG and inform your nurse when you arrive at Short Stay. Do not shave (including legs and underarms) for at least 48 hours prior to the first CHG shower.  You may shave your face/neck. Please follow these instructions carefully:  1.  Shower with CHG Soap the night before surgery and the  morning of Surgery.  2.  If you choose to wash your hair, wash your hair first as usual with your  normal  shampoo.  3.  After you shampoo, rinse your  hair and body thoroughly to remove the  shampoo.                            4.  Use CHG as you would any other liquid soap.  You can apply chg directly  to the skin and wash                       Gently with a scrungie or clean washcloth.  5.  Apply the CHG Soap to your body ONLY FROM THE NECK DOWN.   Do not use on face/ open                           Wound or open sores. Avoid contact with eyes, ears mouth and genitals (private parts).                       Wash face,  Genitals (private parts) with your normal soap.             6.  Wash thoroughly, paying special attention to the area where your surgery  will be performed.  7.  Thoroughly rinse your body with warm water from the neck down.  8.  DO NOT shower/wash with your normal soap after using and rinsing off  the CHG Soap.                9.  Pat yourself dry with a clean towel.            10.  Wear clean pajamas.            11.  Place clean sheets on your bed the night of your first shower and do not  sleep with pets. Day of Surgery : Do not apply any lotions/deodorants the morning of surgery.  Please wear clean clothes to the hospital/surgery center.  FAILURE TO FOLLOW THESE INSTRUCTIONS MAY RESULT IN THE CANCELLATION OF YOUR SURGERY PATIENT SIGNATURE_________________________________  NURSE SIGNATURE__________________________________  ________________________________________________________________________   Adam Phenix  An incentive spirometer is a tool that can help keep your lungs clear and active. This tool measures how well you are filling your lungs with each breath. Taking long deep breaths may help reverse or decrease the chance of developing breathing (pulmonary) problems (especially infection) following:  A long period of time when you are unable to move or be active. BEFORE THE PROCEDURE   If the spirometer includes an indicator to show your best effort, your nurse or respiratory therapist will set it to a desired goal.  If possible, sit up straight or lean slightly  forward. Try not to slouch.  Hold the incentive spirometer in an upright position. INSTRUCTIONS FOR USE  1. Sit on the edge of your bed if possible, or sit up as far as you can in bed or on a chair. 2. Hold the incentive spirometer in an upright position. 3. Breathe out normally. 4. Place the mouthpiece in your mouth and seal your lips tightly around it. 5. Breathe in slowly and as deeply as possible, raising the piston or the ball toward the top of the column. 6. Hold your breath for 3-5 seconds or for as long as possible. Allow the piston or ball to fall to the bottom of the column. 7. Remove the mouthpiece from your mouth and breathe out normally. 8. Rest for a few seconds and repeat  Steps 1 through 7 at least 10 times every 1-2 hours when you are awake. Take your time and take a few normal breaths between deep breaths. 9. The spirometer may include an indicator to show your best effort. Use the indicator as a goal to work toward during each repetition. 10. After each set of 10 deep breaths, practice coughing to be sure your lungs are clear. If you have an incision (the cut made at the time of surgery), support your incision when coughing by placing a pillow or rolled up towels firmly against it. Once you are able to get out of bed, walk around indoors and cough well. You may stop using the incentive spirometer when instructed by your caregiver.  RISKS AND COMPLICATIONS  Take your time so you do not get dizzy or light-headed.  If you are in pain, you may need to take or ask for pain medication before doing incentive spirometry. It is harder to take a deep breath if you are having pain. AFTER USE  Rest and breathe slowly and easily.  It can be helpful to keep track of a log of your progress. Your caregiver can provide you with a simple table to help with this. If you are using the spirometer at home, follow these instructions: SEEK MEDICAL CARE IF:   You are having difficultly using the  spirometer.  You have trouble using the spirometer as often as instructed.  Your pain medication is not giving enough relief while using the spirometer.  You develop fever of 100.5 F (38.1 C) or higher. SEEK IMMEDIATE MEDICAL CARE IF:   You cough up bloody sputum that had not been present before.  You develop fever of 102 F (38.9 C) or greater.  You develop worsening pain at or near the incision site. MAKE SURE YOU:   Understand these instructions.  Will watch your condition.  Will get help right away if you are not doing well or get worse. Document Released: 10/27/2006 Document Revised: 09/08/2011 Document Reviewed: 12/28/2006 [facility] Patient Information 2014 [place], [place].  PLEASE BRING CPAP MASK AND  TUBING ONLY. DEVICE WILL BE PROVIDED! _ WHAT IS A BLOOD TRANSFUSION? Blood Transfusion Information  A transfusion is the replacement of blood or some of its parts. Blood is made up of multiple cells which provide different functions.  Red blood cells carry oxygen and are used for blood loss replacement.  White blood cells fight against infection.  Platelets control bleeding.  Plasma helps clot blood.  Other blood products are available for specialized needs, such as hemophilia or other clotting disorders. BEFORE THE TRANSFUSION  Who gives blood for transfusions?   Healthy volunteers who are fully evaluated to make sure their blood is safe. This is blood bank blood. Transfusion therapy is the safest it has ever been in the practice of medicine. Before blood is taken from a donor, a complete history is taken to make sure that person has no history of diseases nor engages in risky social behavior (examples are intravenous drug use or sexual activity with multiple partners). The donor's travel history is screened to minimize risk of transmitting infections, such as malaria. The donated blood is tested for signs of infectious diseases, such as HIV and hepatitis. The blood  is then tested to be sure it is compatible with you in order to minimize the chance of a transfusion reaction. If you or a relative donates blood, this is often done in anticipation of surgery and is not appropriate for emergency  situations. It takes many days to process the donated blood. RISKS AND COMPLICATIONS Although transfusion therapy is very safe and saves many lives, the main dangers of transfusion include:   Getting an infectious disease.  Developing a transfusion reaction. This is an allergic reaction to something in the blood you were given. Every precaution is taken to prevent this. The decision to have a blood transfusion has been considered carefully by your caregiver before blood is given. Blood is not given unless the benefits outweigh the risks. AFTER THE TRANSFUSION  Right after receiving a blood transfusion, you will usually feel much better and more energetic. This is especially true if your red blood cells have gotten low (anemic). The transfusion raises the level of the red blood cells which carry oxygen, and this usually causes an energy increase.  The nurse administering the transfusion will monitor you carefully for complications. HOME CARE INSTRUCTIONS  No special instructions are needed after a transfusion. You may find your energy is better. Speak with your caregiver about any limitations on activity for underlying diseases you may have. SEEK MEDICAL CARE IF:   Your condition is not improving after your transfusion.  You develop redness or irritation at the intravenous (IV) site. SEEK IMMEDIATE MEDICAL CARE IF:  Any of the following symptoms occur over the next 12 hours:  Shaking chills.  You have a temperature by mouth above 102 F (38.9 C), not controlled by medicine.  Chest, back, or muscle pain.  People around you feel you are not acting correctly or are confused.  Shortness of breath or difficulty breathing.  Dizziness and fainting.  You get a  rash or develop hives.  You have a decrease in urine output.  Your urine turns a dark color or changes to pink, red, or brown. Any of the following symptoms occur over the next 10 days:  You have a temperature by mouth above 102 F (38.9 C), not controlled by medicine.  Shortness of breath.  Weakness after normal activity.  The white part of the eye turns yellow (jaundice).  You have a decrease in the amount of urine or are urinating less often.  Your urine turns a dark color or changes to pink, red, or brown. Document Released: 06/13/2000 Document Revised: 09/08/2011 Document Reviewed: 01/31/2008 [facility] Patient Information 2014 [place], [place].  ___________________________________________________________________________________________________________________

## 2019-05-25 ENCOUNTER — Other Ambulatory Visit: Payer: Self-pay

## 2019-05-25 ENCOUNTER — Encounter (HOSPITAL_COMMUNITY): Payer: Self-pay

## 2019-05-25 ENCOUNTER — Encounter (HOSPITAL_COMMUNITY)
Admission: RE | Admit: 2019-05-25 | Discharge: 2019-05-25 | Disposition: A | Discharge status: Home / Self Care | Payer: Medicare Other | Source: Ambulatory Visit | Attending: Orthopedic Surgery | Admitting: Orthopedic Surgery

## 2019-05-25 DIAGNOSIS — I10 Essential (primary) hypertension: Secondary | ICD-10-CM | POA: Diagnosis not present

## 2019-05-25 DIAGNOSIS — M1711 Unilateral primary osteoarthritis, right knee: Secondary | ICD-10-CM | POA: Diagnosis not present

## 2019-05-25 DIAGNOSIS — Z01818 Encounter for other preprocedural examination: Secondary | ICD-10-CM | POA: Diagnosis not present

## 2019-05-25 HISTORY — DX: Essential (primary) hypertension: I10

## 2019-05-25 LAB — CBC WITH DIFFERENTIAL/PLATELET
Abs Immature Granulocytes: 0.03 10*3/uL (ref 0.00–0.07)
Basophils Absolute: 0 10*3/uL (ref 0.0–0.1)
Basophils Relative: 0 %
Eosinophils Absolute: 0.1 10*3/uL (ref 0.0–0.5)
Eosinophils Relative: 2 %
HCT: 44.3 % (ref 39.0–52.0)
Hemoglobin: 14.8 g/dL (ref 13.0–17.0)
Immature Granulocytes: 1 %
Lymphocytes Relative: 26 %
Lymphs Abs: 1.5 10*3/uL (ref 0.7–4.0)
MCH: 33.1 pg (ref 26.0–34.0)
MCHC: 33.4 g/dL (ref 30.0–36.0)
MCV: 99.1 fL (ref 80.0–100.0)
Monocytes Absolute: 0.4 10*3/uL (ref 0.1–1.0)
Monocytes Relative: 8 %
Neutro Abs: 3.6 10*3/uL (ref 1.7–7.7)
Neutrophils Relative %: 63 %
Platelets: 168 10*3/uL (ref 150–400)
RBC: 4.47 MIL/uL (ref 4.22–5.81)
RDW: 13.7 % (ref 11.5–15.5)
WBC: 5.7 10*3/uL (ref 4.0–10.5)
nRBC: 0 % (ref 0.0–0.2)

## 2019-05-25 LAB — COMPREHENSIVE METABOLIC PANEL
ALT: 28 U/L (ref 0–44)
AST: 26 U/L (ref 15–41)
Albumin: 4.1 g/dL (ref 3.5–5.0)
Alkaline Phosphatase: 89 U/L (ref 38–126)
Anion gap: 10 (ref 5–15)
BUN: 16 mg/dL (ref 8–23)
CO2: 27 mmol/L (ref 22–32)
Calcium: 9.5 mg/dL (ref 8.9–10.3)
Chloride: 101 mmol/L (ref 98–111)
Creatinine, Ser: 1.31 mg/dL — ABNORMAL HIGH (ref 0.61–1.24)
GFR calc Af Amer: 59 mL/min — ABNORMAL LOW (ref >60)
GFR calc non Af Amer: 51 mL/min — ABNORMAL LOW (ref >60)
Glucose, Bld: 110 mg/dL — ABNORMAL HIGH (ref 70–99)
Potassium: 3.9 mmol/L (ref 3.5–5.1)
Sodium: 138 mmol/L (ref 135–145)
Total Bilirubin: 1 mg/dL (ref 0.3–1.2)
Total Protein: 6.8 g/dL (ref 6.5–8.1)

## 2019-05-25 LAB — APTT: aPTT: 31 seconds (ref 24–36)

## 2019-05-25 LAB — PROTIME-INR
INR: 1 (ref 0.8–1.2)
Prothrombin Time: 12.9 seconds (ref 11.4–15.2)

## 2019-05-25 LAB — SURGICAL PCR SCREEN
MRSA, PCR: NEGATIVE
Staphylococcus aureus: NEGATIVE

## 2019-05-25 LAB — ABO/RH: ABO/RH(D): O NEG

## 2019-05-30 ENCOUNTER — Telehealth: Payer: Self-pay

## 2019-05-30 ENCOUNTER — Other Ambulatory Visit: Payer: Self-pay

## 2019-05-30 ENCOUNTER — Encounter: Payer: Self-pay | Admitting: Cardiology

## 2019-05-30 ENCOUNTER — Ambulatory Visit (INDEPENDENT_AMBULATORY_CARE_PROVIDER_SITE_OTHER): Payer: Medicare Other | Admitting: Cardiology

## 2019-05-30 VITALS — BP 148/90 | HR 66 | Temp 97.6°F | Ht 66.0 in | Wt 177.0 lb

## 2019-05-30 DIAGNOSIS — Z01810 Encounter for preprocedural cardiovascular examination: Secondary | ICD-10-CM | POA: Diagnosis not present

## 2019-05-30 DIAGNOSIS — R9431 Abnormal electrocardiogram [ECG] [EKG]: Secondary | ICD-10-CM

## 2019-05-30 DIAGNOSIS — I10 Essential (primary) hypertension: Secondary | ICD-10-CM

## 2019-05-30 NOTE — Addendum Note (Signed)
Addended by: Cristopher Estimable on: 05/30/2019 02:33 PM   Modules accepted: Orders

## 2019-05-30 NOTE — Progress Notes (Signed)
Devin Mulberry, MD Reason for referral-preoperative evaluation and abnormal electrocardiogram.  HPI: 83 year old male for evaluation of abnormal electrocardiogram and preoperative evaluation at request of Earlie Server, MD. Patient has right knee arthralgias and is scheduled for knee replacement on Friday.  He had a preoperative ECG that is abnormal and cardiology asked to evaluate.  He has limited mobility because of his knee.  He cannot ambulate significant distances because of that.  However what he is able to do he denies dyspnea, chest pain, pedal edema or syncope.  Current Outpatient Medications  Medication Sig Dispense Refill  . amLODipine (NORVASC) 5 MG tablet Take 5 mg by mouth daily.    . chlorthalidone (HYGROTON) 25 MG tablet Take 25 mg by mouth daily.    Marland Kitchen HYDROcodone-acetaminophen (NORCO/VICODIN) 5-325 MG tablet Take 1 tablet by mouth every 6 (six) hours as needed.    . metoprolol succinate (TOPROL-XL) 100 MG 24 hr tablet Take 100 mg by mouth daily.     No current facility-administered medications for this visit.     No Known Allergies   Past Medical History:  Diagnosis Date  . Hypertension   . Hypertension     Past Surgical History:  Procedure Laterality Date  . CLOSED REDUCTION FEMORAL SHAFT FRACTURE Left     Social History   Socioeconomic History  . Marital status: Widowed    Spouse name: Not on file  . Number of children: 2  . Years of education: Not on file  . Highest education level: Not on file  Occupational History  . Not on file  Social Needs  . Financial resource strain: Not on file  . Food insecurity    Worry: Not on file    Inability: Not on file  . Transportation needs    Medical: Not on file    Non-medical: Not on file  Tobacco Use  . Smoking status: Never Smoker  . Smokeless tobacco: Never Used  Substance and Sexual Activity  . Alcohol use: Never    Frequency: Never  . Drug use: Never  . Sexual activity: Not on file   Lifestyle  . Physical activity    Days per week: Not on file    Minutes per session: Not on file  . Stress: Not on file  Relationships  . Social Herbalist on phone: Not on file    Gets together: Not on file    Attends religious service: Not on file    Active member of club or organization: Not on file    Attends meetings of clubs or organizations: Not on file    Relationship status: Not on file  . Intimate partner violence    Fear of current or ex partner: Not on file    Emotionally abused: Not on file    Physically abused: Not on file    Forced sexual activity: Not on file  Other Topics Concern  . Not on file  Social History Narrative  . Not on file    Family History  Problem Relation Age of Onset  . Stroke Mother     ROS: Right knee arthralgias but no fevers or chills, productive cough, hemoptysis, dysphasia, odynophagia, melena, hematochezia, dysuria, hematuria, rash, seizure activity, orthopnea, PND, pedal edema, claudication. Remaining systems are negative.  Physical Exam:   Blood pressure (!) 148/90, pulse 66, temperature 97.6 F (36.4 C), height 5\' 6"  (1.676 m), weight 177 lb (80.3 kg).  General:  Well developed/well nourished in NAD Skin  warm/dry Patient not depressed No peripheral clubbing Back-normal HEENT-normal/normal eyelids Neck supple/normal carotid upstroke bilaterally; no bruits; no JVD; no thyromegaly chest - CTA/ normal expansion CV - RRR/normal S1 and S2; no murmurs, rubs or gallops;  PMI nondisplaced Abdomen -NT/ND, no HSM, no mass, + bowel sounds, no bruit 2+ femoral pulses, no bruits Ext-no edema, chords, 2+ DP Neuro-grossly nonfocal  ECG -May 25, 2019 sinus bradycardia, anterior lateral T wave inversion.  Personally reviewed  A/P  1 abnormal electrocardiogram-patient's electrocardiogram shows sinus rhythm with anterior lateral  T wave inversions.  We will arrange Lexiscan nuclear study to further assess.  2 preoperative  evaluation prior to right knee replacement-patient has limited mobility because of knee issues and therefore symptoms with activities cannot be assessed.  His electrocardiogram is abnormal.  He also has hypertension.  We will arrange a Lexiscan nuclear study for risk stratification preoperatively.  If no ischemia he may proceed.  3 hypertension-patient's blood pressure is borderline.  He will continue on his present medications and we will follow.  Olga Millers, MD

## 2019-05-30 NOTE — Patient Instructions (Signed)
Medication Instructions:  NO CHANGE *If you need a refill on your cardiac medications before your next appointment, please call your pharmacy*  Lab Work: If you have labs (blood work) drawn today and your tests are completely normal, you will receive your results only by: Marland Kitchen MyChart Message (if you have MyChart) OR . A paper copy in the mail If you have any lab test that is abnormal or we need to change your treatment, we will call you to review the results.  Testing/Procedures: Your physician has requested that you have a lexiscan myoview. For further information please visit [email or web address]. Please follow instruction sheet, as given.AT Devin [name]: At [facility], you and your health needs are our priority.  As part of our continuing mission to provide you with exceptional heart care, we have created designated Provider Care Teams.  These Care Teams include your primary Cardiologist (physician) and Advanced Practice Providers (APPs -  Physician Assistants and Nurse Practitioners) who all work together to provide you with the care you need, when you need it.  Your physician recommends that you schedule a follow-up appointment in: AS NEEDED PENDING TEST RESULTS

## 2019-05-30 NOTE — Telephone Encounter (Signed)
   [facility] Pre-operative Risk Assessment    Request for surgical clearance:  1. What type of surgery is being performed? Right Total Knee Replacement  2. When is this surgery scheduled? 06/01/19  3. What type of clearance is required (medical clearance vs. Pharmacy clearance to hold med vs. Both)? Medical  4. Are there any medications that need to be held prior to surgery and how long? None  5. Practice name and name of physician performing surgery? [facility]  6. What is your office phone number [phone]   7.   What is your office fax number [phone]  8.   Anesthesia type    Not listed   Kathyrn Lass 05/30/2019, 4:13 PM  _________________________________________________________________   (provider comments below)

## 2019-05-30 NOTE — Telephone Encounter (Signed)
I s/w Sherri, surgery scheduler with [facility]. I went over notes from our pre op team pt was seen today by Dr. Stanford Breed today. Pt is scheduled for a Myoview 05/31/19. Notes from the pre op team , pt's surgery 12/2 may need to be rescheduled. I will fax these notes to [place] at [facility]. Sherri thanked me for the call.

## 2019-05-30 NOTE — Telephone Encounter (Signed)
   Primary Cardiologist: No primary care provider on file.  Chart reviewed as part of pre-operative protocol coverage. Patient was contacted 05/30/2019 in reference to pre-operative risk assessment for pending surgery as outlined below.  Devin [name] was seen on 05/30/2019 by Dr. Stanford Breed. He was noted to have an abnormal EKG and has limited mobility because of knee issues and therefore exertional symptoms cannot be assessed. He also has hypertension. We will arrange for [name] for risk stratification preoperatively. If no ischemia, he may proceed.   Due to the need for stress test, his surgery will likely need to be rescheduled.    Pre-op covering staff: - Please contact requesting surgeon's office via phone to inform them of need for stress test prior to surgery. Surgery will likely need to be rescheduled.  I will efax this to the requesting office.   Daune Perch, NP 05/30/2019, 4:28 PM

## 2019-05-31 ENCOUNTER — Other Ambulatory Visit (HOSPITAL_COMMUNITY): Payer: Medicare Other

## 2019-05-31 ENCOUNTER — Encounter (HOSPITAL_BASED_OUTPATIENT_CLINIC_OR_DEPARTMENT_OTHER)
Admission: RE | Admit: 2019-05-31 | Discharge: 2019-05-31 | Disposition: A | Discharge status: Home / Self Care | Payer: Medicare Other | Source: Ambulatory Visit | Attending: Cardiology | Admitting: Cardiology

## 2019-05-31 ENCOUNTER — Encounter (HOSPITAL_COMMUNITY)
Admission: RE | Admit: 2019-05-31 | Discharge: 2019-05-31 | Disposition: A | Discharge status: Home / Self Care | Payer: Medicare Other | Source: Ambulatory Visit | Attending: Internal Medicine | Admitting: Internal Medicine

## 2019-05-31 ENCOUNTER — Encounter (HOSPITAL_COMMUNITY): Payer: Self-pay

## 2019-05-31 ENCOUNTER — Other Ambulatory Visit (HOSPITAL_COMMUNITY)
Admission: RE | Admit: 2019-05-31 | Discharge: 2019-05-31 | Disposition: A | Discharge status: Home / Self Care | Payer: Medicare Other | Source: Ambulatory Visit | Attending: Orthopedic Surgery | Admitting: Orthopedic Surgery

## 2019-05-31 DIAGNOSIS — Z01812 Encounter for preprocedural laboratory examination: Secondary | ICD-10-CM | POA: Diagnosis not present

## 2019-05-31 DIAGNOSIS — R9431 Abnormal electrocardiogram [ECG] [EKG]: Secondary | ICD-10-CM | POA: Insufficient documentation

## 2019-05-31 DIAGNOSIS — Z20828 Contact with and (suspected) exposure to other viral communicable diseases: Secondary | ICD-10-CM | POA: Insufficient documentation

## 2019-05-31 LAB — NM MYOCAR MULTI W/SPECT W/WALL MOTION / EF
LV dias vol: 83 mL (ref 62–150)
LV sys vol: 37 mL
Peak HR: 93 {beats}/min
RATE: 0.25
Rest HR: 58 {beats}/min
SDS: 0
SRS: 0
SSS: 0
TID: 1.21

## 2019-05-31 LAB — SARS CORONAVIRUS 2 (TAT 6-24 HRS): SARS Coronavirus 2: NEGATIVE

## 2019-05-31 MED ORDER — SODIUM CHLORIDE FLUSH 0.9 % IV SOLN
INTRAVENOUS | Status: AC
  Administered 2019-05-31: 10 mL via INTRAVENOUS
  Filled 2019-05-31: qty 10

## 2019-05-31 MED ORDER — TECHNETIUM TC 99M TETROFOSMIN IV KIT
30.0000 | PACK | Freq: Once | INTRAVENOUS | Status: AC | PRN
  Administered 2019-05-31: 33 via INTRAVENOUS

## 2019-05-31 MED ORDER — REGADENOSON 0.4 MG/5ML IV SOLN
INTRAVENOUS | Status: AC
  Administered 2019-05-31: 0.4 mg via INTRAVENOUS
  Filled 2019-05-31: qty 5

## 2019-05-31 MED ORDER — TECHNETIUM TC 99M TETROFOSMIN IV KIT
10.0000 | PACK | Freq: Once | INTRAVENOUS | Status: AC | PRN
  Administered 2019-05-31: 10.5 via INTRAVENOUS

## 2019-05-31 NOTE — Telephone Encounter (Signed)
   Primary Cardiologist: Kirk Ruths, MD  Chart reviewed as part of pre-operative protocol coverage. Given past medical history and time since last visit, based on ACC/AHA guidelines, Devin [name] would be at acceptable risk for the planned procedure without further cardiovascular testing.   The patient underwent nuclear stress test today that showed no perfusion defects and is deemed low risk.   I will route this recommendation to the requesting party via Epic fax function and remove from pre-op pool.  Please call with questions.  Daune Perch, NP 05/31/2019, 2:58 PM

## 2019-06-02 ENCOUNTER — Ambulatory Visit: Payer: Medicare Other | Admitting: Cardiology

## 2019-06-02 MED ORDER — TRANEXAMIC ACID 1000 MG/10ML IV SOLN
2000.0000 mg | INTRAVENOUS | Status: DC
  Filled 2019-06-02: qty 20

## 2019-06-02 MED ORDER — BUPIVACAINE LIPOSOME 1.3 % IJ SUSP
20.0000 mL | Freq: Once | INTRAMUSCULAR | Status: DC
  Filled 2019-06-02: qty 20

## 2019-06-02 NOTE — Anesthesia Preprocedure Evaluation (Addendum)
Anesthesia Evaluation  Patient identified by MRN, date of birth, ID band Patient awake    Reviewed: Allergy & Precautions, NPO status , Patient's Chart, lab work & pertinent test results, reviewed documented beta blocker date and time   History of Anesthesia Complications Negative for: history of anesthetic complications  Airway Mallampati: II  TM Distance: >3 FB Neck ROM: Full    Dental  (+) Edentulous Upper, Edentulous Lower   Pulmonary neg pulmonary ROS,    Pulmonary exam normal        Cardiovascular hypertension, Pt. on home beta blockers and Pt. on medications Normal cardiovascular exam     Neuro/Psych negative neurological ROS  negative psych ROS   GI/Hepatic negative GI ROS, Neg liver ROS,   Endo/Other  negative endocrine ROS  Renal/GU negative Renal ROS  negative genitourinary   Musculoskeletal  (+) Arthritis , Osteoarthritis,    Abdominal   Peds  Hematology negative hematology ROS (+)   Anesthesia Other Findings Day of surgery medications reviewed with patient.  Reproductive/Obstetrics negative OB ROS                            Anesthesia Physical Anesthesia Plan  ASA: II  Anesthesia Plan: Spinal   Post-op Pain Management:  Regional for Post-op pain   Induction:   PONV Risk Score and Plan: 2 and Treatment may vary due to age or medical condition, Ondansetron, Propofol infusion and Dexamethasone  Airway Management Planned: Natural Airway and Simple Face Mask  Additional Equipment: None  Intra-op Plan:   Post-operative Plan:   Informed Consent: I have reviewed the patients History and Physical, chart, labs and discussed the procedure including the risks, benefits and alternatives for the proposed anesthesia with the patient or authorized representative who has indicated his/her understanding and acceptance.     Dental advisory given  Plan Discussed with:  CRNA  Anesthesia Plan Comments:        Anesthesia Quick Evaluation

## 2019-06-03 ENCOUNTER — Other Ambulatory Visit: Payer: Self-pay

## 2019-06-03 ENCOUNTER — Ambulatory Visit (HOSPITAL_COMMUNITY): Payer: Medicare Other | Admitting: Registered Nurse

## 2019-06-03 ENCOUNTER — Observation Stay (HOSPITAL_COMMUNITY)
Admission: RE | Admit: 2019-06-03 | Discharge: 2019-06-04 | Disposition: A | Discharge status: Home Health | Payer: Medicare Other | Attending: Orthopedic Surgery | Admitting: Orthopedic Surgery

## 2019-06-03 ENCOUNTER — Encounter (HOSPITAL_COMMUNITY): Payer: Self-pay

## 2019-06-03 ENCOUNTER — Ambulatory Visit (HOSPITAL_COMMUNITY): Payer: Medicare Other

## 2019-06-03 ENCOUNTER — Ambulatory Visit (HOSPITAL_COMMUNITY): Payer: Medicare Other | Admitting: Physician Assistant

## 2019-06-03 ENCOUNTER — Encounter (HOSPITAL_COMMUNITY)
Admission: RE | Disposition: A | Discharge status: Home Health | Payer: Self-pay | Source: Home / Self Care | Attending: Orthopedic Surgery

## 2019-06-03 DIAGNOSIS — Z6828 Body mass index (BMI) 28.0-28.9, adult: Secondary | ICD-10-CM | POA: Diagnosis not present

## 2019-06-03 DIAGNOSIS — E669 Obesity, unspecified: Secondary | ICD-10-CM | POA: Diagnosis not present

## 2019-06-03 DIAGNOSIS — M80051A Age-related osteoporosis with current pathological fracture, right femur, initial encounter for fracture: Secondary | ICD-10-CM | POA: Insufficient documentation

## 2019-06-03 DIAGNOSIS — M1711 Unilateral primary osteoarthritis, right knee: Secondary | ICD-10-CM | POA: Diagnosis not present

## 2019-06-03 DIAGNOSIS — G4733 Obstructive sleep apnea (adult) (pediatric): Secondary | ICD-10-CM | POA: Diagnosis not present

## 2019-06-03 DIAGNOSIS — Z79899 Other long term (current) drug therapy: Secondary | ICD-10-CM | POA: Diagnosis not present

## 2019-06-03 DIAGNOSIS — Z8249 Family history of ischemic heart disease and other diseases of the circulatory system: Secondary | ICD-10-CM | POA: Insufficient documentation

## 2019-06-03 DIAGNOSIS — Z471 Aftercare following joint replacement surgery: Secondary | ICD-10-CM | POA: Diagnosis not present

## 2019-06-03 DIAGNOSIS — M21061 Valgus deformity, not elsewhere classified, right knee: Secondary | ICD-10-CM | POA: Diagnosis not present

## 2019-06-03 DIAGNOSIS — Z96651 Presence of right artificial knee joint: Secondary | ICD-10-CM | POA: Diagnosis not present

## 2019-06-03 DIAGNOSIS — I10 Essential (primary) hypertension: Secondary | ICD-10-CM | POA: Diagnosis not present

## 2019-06-03 DIAGNOSIS — Z419 Encounter for procedure for purposes other than remedying health state, unspecified: Secondary | ICD-10-CM

## 2019-06-03 DIAGNOSIS — M24561 Contracture, right knee: Secondary | ICD-10-CM | POA: Insufficient documentation

## 2019-06-03 DIAGNOSIS — S72431A Displaced fracture of medial condyle of right femur, initial encounter for closed fracture: Secondary | ICD-10-CM | POA: Diagnosis not present

## 2019-06-03 DIAGNOSIS — G8918 Other acute postprocedural pain: Secondary | ICD-10-CM | POA: Diagnosis not present

## 2019-06-03 DIAGNOSIS — M2241 Chondromalacia patellae, right knee: Secondary | ICD-10-CM | POA: Diagnosis not present

## 2019-06-03 HISTORY — PX: TOTAL KNEE ARTHROPLASTY: SHX125

## 2019-06-03 LAB — TYPE AND SCREEN
ABO/RH(D): O NEG
Antibody Screen: NEGATIVE

## 2019-06-03 SURGERY — ARTHROPLASTY, KNEE, TOTAL
Anesthesia: Spinal | Site: Knee | Laterality: Right

## 2019-06-03 MED ORDER — OXYCODONE HCL 5 MG PO TABS: ORAL_TABLET | ORAL | 0 refills | Status: DC

## 2019-06-03 MED ORDER — LIDOCAINE 2% (20 MG/ML) 5 ML SYRINGE
INTRAMUSCULAR | Status: AC
  Filled 2019-06-03: qty 5

## 2019-06-03 MED ORDER — WATER FOR IRRIGATION, STERILE IR SOLN
Status: DC | PRN
  Administered 2019-06-03 (×2): 1000 mL

## 2019-06-03 MED ORDER — DEXAMETHASONE SODIUM PHOSPHATE 10 MG/ML IJ SOLN
INTRAMUSCULAR | Status: AC
  Filled 2019-06-03: qty 1

## 2019-06-03 MED ORDER — EPHEDRINE 5 MG/ML INJ
INTRAVENOUS | Status: AC
  Filled 2019-06-03: qty 10

## 2019-06-03 MED ORDER — DIPHENHYDRAMINE HCL 12.5 MG/5ML PO ELIX: 12.5000 mg | ORAL_SOLUTION | ORAL | Status: DC | PRN

## 2019-06-03 MED ORDER — HYDROMORPHONE HCL 1 MG/ML IJ SOLN: 0.5000 mg | INTRAMUSCULAR | Status: DC | PRN

## 2019-06-03 MED ORDER — PHENOL 1.4 % MT LIQD: 1.0000 | OROMUCOSAL | Status: DC | PRN

## 2019-06-03 MED ORDER — PROPOFOL 500 MG/50ML IV EMUL
INTRAVENOUS | Status: DC | PRN
  Administered 2019-06-03: 50 ug/kg/min via INTRAVENOUS

## 2019-06-03 MED ORDER — ACETAMINOPHEN 500 MG PO TABS
1000.0000 mg | ORAL_TABLET | Freq: Four times a day (QID) | ORAL | Status: AC
  Administered 2019-06-03 – 2019-06-04 (×3): 1000 mg via ORAL
  Filled 2019-06-03 (×3): qty 2

## 2019-06-03 MED ORDER — ACETAMINOPHEN 500 MG PO TABS
1000.0000 mg | ORAL_TABLET | Freq: Once | ORAL | Status: AC
  Administered 2019-06-03: 1000 mg via ORAL
  Filled 2019-06-03: qty 2

## 2019-06-03 MED ORDER — METOPROLOL SUCCINATE ER 50 MG PO TB24
100.0000 mg | ORAL_TABLET | Freq: Every day | ORAL | Status: DC
  Administered 2019-06-04: 100 mg via ORAL
  Filled 2019-06-03: qty 2

## 2019-06-03 MED ORDER — BUPIVACAINE LIPOSOME 1.3 % IJ SUSP
10.0000 mL | Freq: Once | INTRAMUSCULAR | Status: AC
  Administered 2019-06-03: 10 mL
  Filled 2019-06-03: qty 10

## 2019-06-03 MED ORDER — ONDANSETRON HCL 4 MG/2ML IJ SOLN
INTRAMUSCULAR | Status: DC | PRN
  Administered 2019-06-03: 4 mg via INTRAVENOUS

## 2019-06-03 MED ORDER — DEXAMETHASONE SODIUM PHOSPHATE 10 MG/ML IJ SOLN
INTRAMUSCULAR | Status: DC | PRN
  Administered 2019-06-03: 10 mg via INTRAVENOUS

## 2019-06-03 MED ORDER — GABAPENTIN 300 MG PO CAPS
300.0000 mg | ORAL_CAPSULE | Freq: Three times a day (TID) | ORAL | Status: DC
  Administered 2019-06-03 – 2019-06-04 (×3): 300 mg via ORAL
  Filled 2019-06-03 (×3): qty 1

## 2019-06-03 MED ORDER — ASPIRIN 81 MG PO CHEW
81.0000 mg | CHEWABLE_TABLET | Freq: Two times a day (BID) | ORAL | Status: DC
  Administered 2019-06-03 – 2019-06-04 (×2): 81 mg via ORAL
  Filled 2019-06-03 (×2): qty 1

## 2019-06-03 MED ORDER — ACETAMINOPHEN 325 MG PO TABS: 325.0000 mg | ORAL_TABLET | Freq: Four times a day (QID) | ORAL | Status: DC | PRN

## 2019-06-03 MED ORDER — OXYCODONE HCL 5 MG/5ML PO SOLN: 5.0000 mg | Freq: Once | ORAL | Status: DC | PRN

## 2019-06-03 MED ORDER — ACETAMINOPHEN 325 MG PO TABS: 650.0000 mg | ORAL_TABLET | ORAL | 2 refills | Status: AC | PRN

## 2019-06-03 MED ORDER — PROPOFOL 500 MG/50ML IV EMUL
INTRAVENOUS | Status: AC
  Filled 2019-06-03: qty 50

## 2019-06-03 MED ORDER — ONDANSETRON HCL 4 MG/2ML IJ SOLN
INTRAMUSCULAR | Status: AC
  Filled 2019-06-03: qty 2

## 2019-06-03 MED ORDER — SODIUM CHLORIDE 0.9% FLUSH
INTRAVENOUS | Status: DC | PRN
  Administered 2019-06-03: 30 mL

## 2019-06-03 MED ORDER — BUPIVACAINE-EPINEPHRINE (PF) 0.25% -1:200000 IJ SOLN
INTRAMUSCULAR | Status: DC | PRN
  Administered 2019-06-03: 30 mL

## 2019-06-03 MED ORDER — SODIUM CHLORIDE 0.9 % IR SOLN
Status: DC | PRN
  Administered 2019-06-03: 1000 mL

## 2019-06-03 MED ORDER — TRANEXAMIC ACID-NACL 1000-0.7 MG/100ML-% IV SOLN
1000.0000 mg | INTRAVENOUS | Status: AC
  Administered 2019-06-03: 1000 mg via INTRAVENOUS
  Filled 2019-06-03: qty 100

## 2019-06-03 MED ORDER — SODIUM CHLORIDE (PF) 0.9 % IJ SOLN
INTRAMUSCULAR | Status: AC
  Filled 2019-06-03: qty 50

## 2019-06-03 MED ORDER — PROMETHAZINE HCL 25 MG/ML IJ SOLN: 6.2500 mg | INTRAMUSCULAR | Status: DC | PRN

## 2019-06-03 MED ORDER — FENTANYL CITRATE (PF) 100 MCG/2ML IJ SOLN
INTRAMUSCULAR | Status: AC
  Filled 2019-06-03: qty 2

## 2019-06-03 MED ORDER — PROPOFOL 10 MG/ML IV BOLUS
INTRAVENOUS | Status: DC | PRN
  Administered 2019-06-03: 20 mg via INTRAVENOUS

## 2019-06-03 MED ORDER — FENTANYL CITRATE (PF) 100 MCG/2ML IJ SOLN: 25.0000 ug | INTRAMUSCULAR | Status: DC | PRN

## 2019-06-03 MED ORDER — MENTHOL 3 MG MT LOZG: 1.0000 | LOZENGE | OROMUCOSAL | Status: DC | PRN

## 2019-06-03 MED ORDER — LACTATED RINGERS IV SOLN
INTRAVENOUS | Status: DC | PRN
  Administered 2019-06-03: 09:00:00 via INTRAVENOUS

## 2019-06-03 MED ORDER — AMLODIPINE BESYLATE 5 MG PO TABS
5.0000 mg | ORAL_TABLET | Freq: Every day | ORAL | Status: DC
  Administered 2019-06-03 – 2019-06-04 (×2): 5 mg via ORAL
  Filled 2019-06-03 (×2): qty 1

## 2019-06-03 MED ORDER — GABAPENTIN 300 MG PO CAPS: ORAL_CAPSULE | ORAL | 0 refills | Status: DC

## 2019-06-03 MED ORDER — METOCLOPRAMIDE HCL 5 MG/ML IJ SOLN: 5.0000 mg | Freq: Three times a day (TID) | INTRAMUSCULAR | Status: DC | PRN

## 2019-06-03 MED ORDER — CEFAZOLIN SODIUM-DEXTROSE 1-4 GM/50ML-% IV SOLN
1.0000 g | Freq: Four times a day (QID) | INTRAVENOUS | Status: AC
  Administered 2019-06-03 (×2): 1 g via INTRAVENOUS
  Filled 2019-06-03 (×2): qty 50

## 2019-06-03 MED ORDER — BUPIVACAINE-EPINEPHRINE (PF) 0.5% -1:200000 IJ SOLN
INTRAMUSCULAR | Status: DC | PRN
  Administered 2019-06-03: 15 mL via PERINEURAL

## 2019-06-03 MED ORDER — SODIUM CHLORIDE 0.9 % IV SOLN: INTRAVENOUS | Status: DC

## 2019-06-03 MED ORDER — LIDOCAINE 2% (20 MG/ML) 5 ML SYRINGE
INTRAMUSCULAR | Status: DC | PRN
  Administered 2019-06-03: 40 mg via INTRAVENOUS

## 2019-06-03 MED ORDER — ASPIRIN EC 81 MG PO TBEC: 81.0000 mg | DELAYED_RELEASE_TABLET | Freq: Two times a day (BID) | ORAL | 0 refills | Status: AC

## 2019-06-03 MED ORDER — SODIUM CHLORIDE 0.9 % IV SOLN
INTRAVENOUS | Status: DC
  Administered 2019-06-03: 06:00:00 via INTRAVENOUS

## 2019-06-03 MED ORDER — OXYCODONE HCL 5 MG PO TABS
5.0000 mg | ORAL_TABLET | ORAL | Status: DC | PRN
  Administered 2019-06-03 (×2): 5 mg via ORAL
  Administered 2019-06-03: 10 mg via ORAL
  Administered 2019-06-04: 5 mg via ORAL
  Filled 2019-06-03: qty 2
  Filled 2019-06-03 (×3): qty 1

## 2019-06-03 MED ORDER — CEFAZOLIN SODIUM-DEXTROSE 2-4 GM/100ML-% IV SOLN
2.0000 g | INTRAVENOUS | Status: AC
  Administered 2019-06-03: 2 g via INTRAVENOUS
  Filled 2019-06-03: qty 100

## 2019-06-03 MED ORDER — OXYCODONE HCL 5 MG PO TABS: 5.0000 mg | ORAL_TABLET | Freq: Once | ORAL | Status: DC | PRN

## 2019-06-03 MED ORDER — ONDANSETRON HCL 4 MG PO TABS: 4.0000 mg | ORAL_TABLET | Freq: Four times a day (QID) | ORAL | Status: DC | PRN

## 2019-06-03 MED ORDER — POLYETHYLENE GLYCOL 3350 17 G PO PACK: 17.0000 g | PACK | Freq: Every day | ORAL | Status: DC | PRN

## 2019-06-03 MED ORDER — BUPIVACAINE IN DEXTROSE 0.75-8.25 % IT SOLN
INTRATHECAL | Status: DC | PRN
  Administered 2019-06-03: 2 mL via INTRATHECAL

## 2019-06-03 MED ORDER — FENTANYL CITRATE (PF) 100 MCG/2ML IJ SOLN
INTRAMUSCULAR | Status: DC | PRN
  Administered 2019-06-03 (×2): 50 ug via INTRAVENOUS

## 2019-06-03 MED ORDER — METOCLOPRAMIDE HCL 5 MG PO TABS: 5.0000 mg | ORAL_TABLET | Freq: Three times a day (TID) | ORAL | Status: DC | PRN

## 2019-06-03 MED ORDER — PROPOFOL 10 MG/ML IV BOLUS
INTRAVENOUS | Status: AC
  Filled 2019-06-03: qty 20

## 2019-06-03 MED ORDER — POVIDONE-IODINE 10 % EX SWAB
2.0000 "application " | Freq: Once | CUTANEOUS | Status: AC
  Administered 2019-06-03: 2 via TOPICAL

## 2019-06-03 MED ORDER — BISACODYL 10 MG RE SUPP: 10.0000 mg | Freq: Every day | RECTAL | Status: DC | PRN

## 2019-06-03 MED ORDER — ONDANSETRON HCL 4 MG/2ML IJ SOLN: 4.0000 mg | Freq: Four times a day (QID) | INTRAMUSCULAR | Status: DC | PRN

## 2019-06-03 MED ORDER — EPHEDRINE SULFATE-NACL 50-0.9 MG/10ML-% IV SOSY
PREFILLED_SYRINGE | INTRAVENOUS | Status: DC | PRN
  Administered 2019-06-03 (×3): 10 mg via INTRAVENOUS

## 2019-06-03 MED ORDER — DOCUSATE SODIUM 100 MG PO CAPS
100.0000 mg | ORAL_CAPSULE | Freq: Two times a day (BID) | ORAL | Status: DC
  Administered 2019-06-03 – 2019-06-04 (×2): 100 mg via ORAL
  Filled 2019-06-03 (×2): qty 1

## 2019-06-03 MED ORDER — BUPIVACAINE-EPINEPHRINE 0.25% -1:200000 IJ SOLN
INTRAMUSCULAR | Status: AC
  Filled 2019-06-03: qty 1

## 2019-06-03 MED ORDER — FLEET ENEMA 7-19 GM/118ML RE ENEM: 1.0000 | ENEMA | Freq: Once | RECTAL | Status: DC | PRN

## 2019-06-03 MED ORDER — TRANEXAMIC ACID 1000 MG/10ML IV SOLN
INTRAVENOUS | Status: DC | PRN
  Administered 2019-06-03: 2000 mg via TOPICAL

## 2019-06-03 MED ORDER — CHLORHEXIDINE GLUCONATE 4 % EX LIQD: 60.0000 mL | Freq: Once | CUTANEOUS | Status: DC

## 2019-06-03 MED ORDER — LABETALOL HCL 100 MG PO TABS
300.0000 mg | ORAL_TABLET | Freq: Two times a day (BID) | ORAL | Status: DC
  Administered 2019-06-03 – 2019-06-04 (×3): 300 mg via ORAL
  Filled 2019-06-03 (×3): qty 3

## 2019-06-03 MED ORDER — TRANEXAMIC ACID-NACL 1000-0.7 MG/100ML-% IV SOLN
1000.0000 mg | Freq: Once | INTRAVENOUS | Status: AC
  Administered 2019-06-03: 1000 mg via INTRAVENOUS
  Filled 2019-06-03: qty 100

## 2019-06-03 SURGICAL SUPPLY — 63 items
ATTUNE MED DOME PAT 38 KNEE (Knees) ×2 IMPLANT
ATTUNE PS FEM RT SZ 6 CEM KNEE (Femur) ×2 IMPLANT
ATTUNE PSRP INSR SZ6 5 KNEE (Insert) ×2 IMPLANT
BAG DECANTER FOR FLEXI CONT (MISCELLANEOUS) ×2 IMPLANT
BAG ZIPLOCK 12X15 (MISCELLANEOUS) ×2 IMPLANT
BASE TIBIAL ROT PLAT SZ 5 KNEE (Knees) ×1 IMPLANT
BENZOIN TINCTURE PRP APPL 2/3 (GAUZE/BANDAGES/DRESSINGS) ×2 IMPLANT
BIT DRILL 4.8X300 (BIT) ×2 IMPLANT
BLADE SAGITTAL 25.0X1.19X90 (BLADE) ×2 IMPLANT
BLADE SAW SGTL 13X75X1.27 (BLADE) ×2 IMPLANT
BLADE SURG 15 STRL LF DISP TIS (BLADE) ×1 IMPLANT
BLADE SURG 15 STRL SS (BLADE) ×1
BLADE SURG SZ10 CARB STEEL (BLADE) ×2 IMPLANT
BNDG ELASTIC 6X15 VLCR STRL LF (GAUZE/BANDAGES/DRESSINGS) ×2 IMPLANT
BOWL SMART MIX CTS (DISPOSABLE) ×2 IMPLANT
CEMENT HV SMART SET (Cement) ×4 IMPLANT
CLSR STERI-STRIP ANTIMIC 1/2X4 (GAUZE/BANDAGES/DRESSINGS) ×2 IMPLANT
COVER SURGICAL LIGHT HANDLE (MISCELLANEOUS) ×2 IMPLANT
COVER WAND RF STERILE (DRAPES) ×2 IMPLANT
CUFF TOURN SGL QUICK 34 (TOURNIQUET CUFF) ×1
CUFF TRNQT CYL 34X4.125X (TOURNIQUET CUFF) ×1 IMPLANT
DECANTER SPIKE VIAL GLASS SM (MISCELLANEOUS) ×4 IMPLANT
DRAPE INCISE IOBAN 66X45 STRL (DRAPES) IMPLANT
DRAPE U-SHAPE 47X51 STRL (DRAPES) ×2 IMPLANT
DRESSING AQUACEL AG SP 3.5X10 (GAUZE/BANDAGES/DRESSINGS) ×1 IMPLANT
DRSG AQUACEL AG SP 3.5X10 (GAUZE/BANDAGES/DRESSINGS) ×2
DURAPREP 26ML APPLICATOR (WOUND CARE) ×4 IMPLANT
ELECT REM PT RETURN 15FT ADLT (MISCELLANEOUS) ×2 IMPLANT
GLOVE BIOGEL PI IND STRL 8 (GLOVE) ×2 IMPLANT
GLOVE BIOGEL PI INDICATOR 8 (GLOVE) ×2
GLOVE SURG ORTHO 8.0 STRL STRW (GLOVE) ×2 IMPLANT
GLOVE SURG SS PI 7.5 STRL IVOR (GLOVE) ×2 IMPLANT
GOWN STRL REUS W/TWL XL LVL3 (GOWN DISPOSABLE) ×4 IMPLANT
HANDPIECE INTERPULSE COAX TIP (DISPOSABLE) ×1
HOLDER FOLEY CATH W/STRAP (MISCELLANEOUS) IMPLANT
HOOD PEEL AWAY FLYTE STAYCOOL (MISCELLANEOUS) ×2 IMPLANT
IMMOBILIZER KNEE 20 (SOFTGOODS) ×2
IMMOBILIZER KNEE 20 THIGH 36 (SOFTGOODS) ×1 IMPLANT
KIT TURNOVER KIT A (KITS) IMPLANT
MANIFOLD NEPTUNE II (INSTRUMENTS) ×2 IMPLANT
NEEDLE HYPO 22GX1.5 SAFETY (NEEDLE) ×4 IMPLANT
NS IRRIG 1000ML POUR BTL (IV SOLUTION) ×2 IMPLANT
PACK ICE MAXI GEL EZY WRAP (MISCELLANEOUS) ×2 IMPLANT
PACK TOTAL KNEE CUSTOM (KITS) ×2 IMPLANT
PENCIL SMOKE EVACUATOR (MISCELLANEOUS) IMPLANT
PIN DRILL FIX HALF THREAD (BIT) ×2 IMPLANT
PIN GUIDE DRILL TIP 2.8X300 (DRILL) ×4 IMPLANT
PIN STEINMAN FIXATION KNEE (PIN) ×2 IMPLANT
PROTECTOR NERVE ULNAR (MISCELLANEOUS) ×2 IMPLANT
SCREW CANN THRD 6.5X70 (Screw) ×2 IMPLANT
SCREW CANNULATED 6.5X65 KNEE (Screw) ×2 IMPLANT
SET HNDPC FAN SPRY TIP SCT (DISPOSABLE) ×1 IMPLANT
SUT ETHIBOND NAB CT1 #1 30IN (SUTURE) ×4 IMPLANT
SUT MNCRL AB 3-0 PS2 18 (SUTURE) ×2 IMPLANT
SUT VIC AB 0 CT1 36 (SUTURE) ×2 IMPLANT
SUT VIC AB 2-0 CT1 27 (SUTURE) ×2
SUT VIC AB 2-0 CT1 TAPERPNT 27 (SUTURE) ×2 IMPLANT
SYR CONTROL 10ML LL (SYRINGE) ×6 IMPLANT
TIBIAL BASE ROT PLAT SZ 5 KNEE (Knees) ×2 IMPLANT
TRAY FOLEY MTR SLVR 16FR STAT (SET/KITS/TRAYS/PACK) ×2 IMPLANT
WATER STERILE IRR 1000ML POUR (IV SOLUTION) ×4 IMPLANT
WRAP KNEE MAXI GEL POST OP (GAUZE/BANDAGES/DRESSINGS) ×2 IMPLANT
YANKAUER SUCT BULB TIP 10FT TU (MISCELLANEOUS) ×2 IMPLANT

## 2019-06-03 NOTE — Care Plan (Signed)
Ortho Bundle Case Management Note  Patient Details  Name: Devin [name] MRN: [id number] Date of Birth: 08/19/1935   spoke with patient and daughter in law prior to surgery. He plans to discharge to home with their assistance. HHPT has been set up with [facility] in [place] [phone] fax.  Please send [place] orders and discharge information to them. OPPT has been arranged as well. He has all needed equipment at home. Patient and MD in agreement with plan. Choice offered.                     DME Arranged:  [facility] DME Agency:  [employer]  HH Arranged:  PT [place] Agency:  [place]  Additional Comments: Please contact me with any questions of if this plan should need to change.  Ladell Heads,  [facility]  [phone] 06/03/2019, 3:11 PM

## 2019-06-03 NOTE — Anesthesia Postprocedure Evaluation (Signed)
Anesthesia Post Note  Patient: Devin [name]  Procedure(s) Performed: TOTAL KNEE ARTHROPLASTY, ORIF MEDIAL CONDYLE, LATERAL RELEASE (Right Knee)     Patient location during evaluation: PACU Anesthesia Type: Spinal Level of consciousness: awake and alert and oriented Pain management: pain level controlled Vital Signs Assessment: post-procedure vital signs reviewed and stable Respiratory status: spontaneous breathing, nonlabored ventilation and respiratory function stable Cardiovascular status: blood pressure returned to baseline Postop Assessment: no apparent nausea or vomiting Anesthetic complications: no    Last Vitals:  Vitals:   06/03/19 1200 06/03/19 1232  BP: (!) 152/77 (!) 164/81  Pulse: 61 64  Resp: 12   Temp: 36.4 C 36.6 C  SpO2: 96% 97%    Last Pain:  Vitals:   06/03/19 1232  TempSrc: Oral  PainSc:                  Brennan Bailey

## 2019-06-03 NOTE — Interval H&P Note (Signed)
History and Physical Interval Note:  06/03/2019 7:30 AM  Devin [name]  has presented today for surgery, with the diagnosis of OSTEOARTHRITIS RIGHT KNEE.  The various methods of treatment have been discussed with the patient and family. After consideration of risks, benefits and other options for treatment, the patient has consented to  Procedure(s): TOTAL KNEE ARTHROPLASTY (Right) as a surgical intervention.  The patient's history has been reviewed, patient examined, no change in status, stable for surgery.  I have reviewed the patient's chart and labs.  Questions were answered to the patient's satisfaction.     Yvette Rack

## 2019-06-03 NOTE — Evaluation (Signed)
Physical Therapy Evaluation Patient Details Name: Devin [name] MRN: [id number] DOB: 1936-04-16 Today's Date: 06/03/2019   History of Present Illness  Patient is 83 y.o. male s/p Rt TKA on 06/03/19 with PMH significant for HTN.  Clinical Impression  Devin [name] is a 83 y.o. male POD 0 s/p Rt TKA. Patient reports independence with mobility at baseline. Patient is now limited by functional impairments (see PT problem list below) and requires min assist  for transfers and gait with RW. Patient was able to ambulate ~50 feet with RW and min assist with cues for step pattern and safe walker management. Patient instructed in exercise to facilitate ROM and circulation. Patient will benefit from continued skilled PT interventions to address impairments and progress towards PLOF. Acute PT will follow to progress mobility and stair training in preparation for safe discharge home.    Follow Up Recommendations Follow surgeon's recommendation for DC plan and follow-up therapies    Equipment Recommendations  None recommended by PT    Recommendations for Other Services       Precautions / Restrictions Precautions Precautions: Fall Restrictions Weight Bearing Restrictions: No      Mobility  Bed Mobility Overal bed mobility: Needs Assistance Bed Mobility: Supine to Sit     Supine to sit: HOB elevated;Min assist     General bed mobility comments: cues for use of bed rails, assist to raise trunk and bring LE's to EOB  Transfers Overall transfer level: Needs assistance Equipment used: Rolling walker (2 wheeled) Transfers: Sit to/from Stand Sit to Stand: Min assist         General transfer comment: cues for hand placement and technique with RW, assist to initiate rise from EOB and steady with stand  Ambulation/Gait Ambulation/Gait assistance: Min assist Gait Distance (Feet): 60 Feet Assistive device: Rolling walker (2 wheeled) Gait Pattern/deviations: Step-through  pattern;Decreased stride length;Decreased stance time - right;Decreased step length - left;Narrow base of support;Drifts right/left Gait velocity: decreased   General Gait Details: pt reqrueid ceus for hand placement and to sequencing step pattern with RW. assist required to steady walker throughout. Verbal cues requried for patient to maintain safe proximity to walker as he had tendency to stand to left side of walker rather than in the center.  Stairs            Wheelchair Mobility    Modified Rankin (Stroke Patients Only)       Balance Overall balance assessment: Needs assistance Sitting-balance support: Feet supported Sitting balance-Leahy Scale: Good     Standing balance support: During functional activity;Bilateral upper extremity supported Standing balance-Leahy Scale: Poor              Pertinent Vitals/Pain Pain Assessment: Faces Faces Pain Scale: Hurts even more Pain Location: Rt knee Pain Descriptors / Indicators: Grimacing;Guarding Pain Intervention(s): Limited activity within patient's tolerance;Monitored during session;Repositioned;Ice applied    Home Living Family/patient expects to be discharged to:: Private residence Living Arrangements: Alone Available Help at Discharge: Family;Available 24 hours/day Type of Home: House Home Access: Stairs to enter Entrance Stairs-Rails: None Entrance Stairs-Number of Steps: 1 Home Layout: Able to live on main level with bedroom/bathroom;Laundry or work area in basement;One level Home Equipment: [place] - 2 wheels;Cane - single point;Bedside commode      Prior Function Level of Independence: Independent         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     Hand Dominance   Dominant Hand: Left    Extremity/Trunk Assessment  Upper Extremity Assessment Upper Extremity Assessment: Overall WFL for tasks assessed    Lower Extremity Assessment Lower Extremity Assessment: Generalized weakness;RLE  deficits/detail RLE Deficits / Details: pt with no extensor lag with SLR, mild knee flexion in stance RLE Sensation: WNL RLE Coordination: WNL    Cervical / Trunk Assessment Cervical / Trunk Assessment: Kyphotic  Communication   Communication: HOH  Cognition Arousal/Alertness: Awake/alert Behavior During Therapy: WFL for tasks assessed/performed Overall Cognitive Status: Within Functional Limits for tasks assessed                 General Comments      Exercises Total Joint Exercises Ankle Circles/Pumps: AROM;15 reps;Seated;Both Quad Sets: AROM;10 reps;Seated;Right Heel Slides: AROM;10 reps;Seated;Right   Assessment/Plan    PT Assessment Patient needs continued PT services  PT Problem List Decreased strength;Decreased balance;Decreased mobility;Decreased range of motion;Decreased activity tolerance;Decreased knowledge of use of DME       PT Treatment Interventions DME instruction;Functional mobility training;Balance training;Patient/family education;Therapeutic activities;Gait training;Stair training;Therapeutic exercise    PT Goals (Current goals can be found in the Care Plan section)  Acute Rehab PT Goals Patient Stated Goal: get home and back to work PT Goal Formulation: With patient Time For Goal Achievement: 06/10/19 Potential to Achieve Goals: Good    Frequency 7X/week    AM-PAC PT "6 Clicks" Mobility  Outcome Measure Help needed turning from your back to your side while in a flat bed without using bedrails?: A Little Help needed moving from lying on your back to sitting on the side of a flat bed without using bedrails?: A Little Help needed moving to and from a bed to a chair (including a wheelchair)?: A Little Help needed standing up from a chair using your arms (e.g., wheelchair or bedside chair)?: A Little Help needed to walk in hospital room?: A Little Help needed climbing 3-5 steps with a railing? : A Lot 6 Click Score: 17    End of Session  Equipment Utilized During Treatment: Gait belt Activity Tolerance: Patient tolerated treatment well Patient left: with call bell/phone within reach;in chair;with family/visitor present;with chair alarm set Nurse Communication: Mobility status PT Visit Diagnosis: Muscle weakness (generalized) (M62.81);Difficulty in walking, not elsewhere classified (R26.2)    Time: 0762-2633 PT Time Calculation (min) (ACUTE ONLY): 30 min   Charges:   PT Evaluation $PT Eval Low Complexity: 1 Low PT Treatments $Therapeutic Exercise: 8-22 mins        Valentino Saxon, PT, DPT Physical Therapist with [facility]  06/03/2019 7:03 PM

## 2019-06-03 NOTE — Discharge Instructions (Signed)

## 2019-06-03 NOTE — Anesthesia Procedure Notes (Signed)
Date/Time: 06/03/2019 7:50 AM Performed by: Talbot Grumbling, CRNA Oxygen Delivery Method: Simple face mask

## 2019-06-03 NOTE — Interval H&P Note (Signed)
History and Physical Interval Note:  06/03/2019 7:30 AM  Devin [name]  has presented today for surgery, with the diagnosis of OSTEOARTHRITIS RIGHT KNEE.  The various methods of treatment have been discussed with the patient and family. After consideration of risks, benefits and other options for treatment, the patient has consented to  Procedure(s): TOTAL KNEE ARTHROPLASTY (Right) as a surgical intervention.  The patient's history has been reviewed, patient examined, no change in status, stable for surgery.  I have reviewed the patient's chart and labs.  Questions were answered to the patient's satisfaction.     W D Brayant Dorr Jr   

## 2019-06-03 NOTE — Anesthesia Procedure Notes (Signed)
Spinal  Patient location during procedure: OR Start time: 06/03/2019 7:43 AM End time: 06/03/2019 7:46 AM Staffing Anesthesiologist: Lillia Abed, MD Performed: anesthesiologist  Preanesthetic Checklist Completed: patient identified, surgical consent, pre-op evaluation, timeout performed, IV checked, risks and benefits discussed and monitors and equipment checked Spinal Block Patient position: sitting Prep: DuraPrep Patient monitoring: heart rate, cardiac monitor, continuous pulse ox and blood pressure Approach: left paramedian Location: L2-3 Injection technique: single-shot Needle Needle type: Pencan  Needle gauge: 24 G Needle length: 9 cm Needle insertion depth: 7 cm

## 2019-06-03 NOTE — Brief Op Note (Signed)
06/03/2019  11:04 AM  PATIENT:  Devin [name]  83 y.o. male  PRE-OPERATIVE DIAGNOSIS:  OSTEOARTHRITIS RIGHT KNEE  POST-OPERATIVE DIAGNOSIS:  OSTEOARTHRITIS RIGHT KNEE  PROCEDURE:  Procedure(s): TOTAL KNEE ARTHROPLASTY, ORIF MEDIAL CONDYLE, LATERAL RELEASE (Right)  SURGEON:  Surgeon(s) and Role:    Earlie Server, MD - Primary  PHYSICIAN ASSISTANT: Chriss Czar, PA-C  ASSISTANTS: OR staff x1   ANESTHESIA:   local, regional, spinal and IV sedation  EBL:  150 mL   BLOOD ADMINISTERED:none  DRAINS: none   LOCAL MEDICATIONS USED:  MARCAINE     SPECIMEN:  No Specimen  DISPOSITION OF SPECIMEN:  N/A  COUNTS:  YES  TOURNIQUET:   Total Tourniquet Time Documented: Thigh (Right) - 95 minutes Total: Thigh (Right) - 95 minutes   DICTATION: .Other Dictation: Dictation Number unknown  PLAN OF CARE: Admit for overnight observation  PATIENT DISPOSITION:  PACU - hemodynamically stable.   Delay start of Pharmacological VTE agent (>24hrs) due to surgical blood loss or risk of bleeding: yes

## 2019-06-03 NOTE — Op Note (Signed)
NAME: Devin [name], Devin [name] MEDICAL RECORD NL:[id number] ACCOUNT [id number] DATE OF BIRTH:1935-12-05 FACILITY: [facility] LOCATION: [facility] PHYSICIAN:W. Salima Rumer JR., MD  OPERATIVE REPORT  DATE OF PROCEDURE:  06/03/2019  PREOPERATIVE DIAGNOSIS:  Severe osteoarthritis, right knee with severe valgus deformity and flexion contracture.  POSTOPERATIVE DIAGNOSIS:  Severe osteoarthritis, right knee with severe valgus deformity and flexion contracture.  OPERATION: 1.  Right total knee (Attune size 6 femur cemented, size 5 mm size 6 bearing, size 5 cemented tibial insert, 38 mm all poly patella. 2.  Open reduction and internal fixation of the medial condyle fracture. 3.  Open lateral release.  These were all done for the right knee.  TOURNIQUET TIME:  90 minutes.  ANESTHESIA:  Spinal anesthetic with adductor block and local.  DESCRIPTION OF PROCEDURE:  Straight skin incision was made, medial parapatellar approach to the knee, made with minimal stripping the medial side of the knee.  The patient had a severe valgus deformity approximately 30 degrees with flexion contracture of  25-30 degrees as well.  We did a 5-degree 9 mm cut on the distal femur followed by cutting about 2 mm below the most diseased severely worn lateral compartment with the extension gap being measured at 5 mm.  Femur was sized to be a size 6 followed by  placement of all-in-1 cutting block with cutting the anterior, posterior chamfer cuts in the appropriate degree of external rotation.  The flexion gap equaled the extension gap at 5 mm.  Severe tightness over the posterolateral compartment was noted.   Release of the PCL and posterior capsule was carried out as well.  In stripping the medial side of the knee we used a laminar spreader at one point for additional stripping.  With the laminar spreader intact due to the osteoporosis of the bone we did  note iatrogenic crack developed in the medial condyle with a laminar spreader and  we noted this.  We fixed this fracture with two 6.5 mm cannulated titanium screws.  We confirmed the screw position with the C-arm fluoroscopy with the guide pin in as well  as final position of the screws.  Once the screws were placed from the medial and lateral side and confirmed to be in good position in both planes, we proceeded to cement the knee and place the tibia followed by femur and patella with the trial bearing.   Trial bearing was removed.  Excess cement was removed from the posterior aspect of the knee.  Tourniquet was released after fixation of the fracture and was not put up for the remainder of the case.  We confirmed positioning of the components to be  adequate with the final C-arm fluoroscopy AP lateral.  We did have to perform a lateral release due to the severe valgus deformity lateral release of the patella to allow better patellar tracking.  Closure was affected with #1 Ethibond, 2-0 Vicryl and Monocryl in the skin.  A lightly compressive sterile  dressing applied.  Taken to recovery room in stable condition.  TN/NUANCE  D:06/03/2019 T:06/03/2019 JOB:[id number]

## 2019-06-03 NOTE — Anesthesia Procedure Notes (Signed)
Anesthesia Regional Block: Adductor canal block   Pre-Anesthetic Checklist: ,, timeout performed, Correct Patient, Correct Site, Correct Laterality, Correct Procedure, Correct Position, site marked, Risks and benefits discussed, pre-op evaluation,  At surgeon's request and post-op pain management  Laterality: Right  Prep: Maximum Sterile Barrier Precautions used, chloraprep       Needles:  Injection technique: Single-shot  Needle Type: Echogenic Stimulator Needle     Needle Length: 9cm  Needle Gauge: 22     Additional Needles:   Procedures:,,,, ultrasound used (permanent image in chart),,,,  Narrative:  Start time: 06/03/2019 7:07 AM End time: 06/03/2019 7:09 AM Injection made incrementally with aspirations every 5 mL.  Performed by: Personally  Anesthesiologist: Brennan Bailey, MD  Additional Notes: Risks, benefits, and alternative discussed. Patient gave consent for procedure. Patient prepped and draped in sterile fashion. Sedation administered, patient remains easily responsive to voice. Relevant anatomy identified with ultrasound guidance. Local anesthetic given in 5cc increments with no signs or symptoms of intravascular injection. No pain or paraesthesias with injection. Patient monitored throughout procedure with signs of LAST or immediate complications. Tolerated well. Ultrasound image placed in chart.  Tawny Asal, MD

## 2019-06-03 NOTE — Transfer of Care (Signed)
Immediate Anesthesia Transfer of Care Note  Patient: Devin [name]  Procedure(s) Performed: TOTAL KNEE ARTHROPLASTY, ORIF MEDIAL CONDYLE, LATERAL RELEASE (Right Knee)  Patient Location: PACU  Anesthesia Type:MAC and Spinal  Level of Consciousness: awake, alert , oriented and patient cooperative  Airway & Oxygen Therapy: Patient Spontanous Breathing and Patient connected to face mask oxygen  Post-op Assessment: Report given to RN, Post -op Vital signs reviewed and stable and Patient moving all extremities  Post vital signs: Reviewed and stable  Last Vitals:  Vitals Value Taken Time  BP 128/68 06/03/19 1100  Temp    Pulse 64 06/03/19 1102  Resp 16 06/03/19 1102  SpO2 97 % 06/03/19 1102  Vitals shown include unvalidated device data.  Last Pain:  Vitals:   06/03/19 0619  TempSrc:   PainSc: 0-No pain         Complications: No apparent anesthesia complications

## 2019-06-03 NOTE — Progress Notes (Signed)
Patient denies the use of home nocturnal CPAP and declines the use of it at this time. Order changed to prn per protocol.

## 2019-06-04 DIAGNOSIS — M1711 Unilateral primary osteoarthritis, right knee: Secondary | ICD-10-CM | POA: Diagnosis not present

## 2019-06-04 LAB — CBC
HCT: 38.8 % — ABNORMAL LOW (ref 39.0–52.0)
Hemoglobin: 13.1 g/dL (ref 13.0–17.0)
MCH: 32.7 pg (ref 26.0–34.0)
MCHC: 33.8 g/dL (ref 30.0–36.0)
MCV: 96.8 fL (ref 80.0–100.0)
Platelets: 175 10*3/uL (ref 150–400)
RBC: 4.01 MIL/uL — ABNORMAL LOW (ref 4.22–5.81)
RDW: 12.8 % (ref 11.5–15.5)
WBC: 10.8 10*3/uL — ABNORMAL HIGH (ref 4.0–10.5)
nRBC: 0 % (ref 0.0–0.2)

## 2019-06-04 LAB — BASIC METABOLIC PANEL
Anion gap: 10 (ref 5–15)
BUN: 16 mg/dL (ref 8–23)
CO2: 24 mmol/L (ref 22–32)
Calcium: 8.6 mg/dL — ABNORMAL LOW (ref 8.9–10.3)
Chloride: 99 mmol/L (ref 98–111)
Creatinine, Ser: 0.97 mg/dL (ref 0.61–1.24)
GFR calc Af Amer: >60 mL/min (ref >60)
GFR calc non Af Amer: >60 mL/min (ref >60)
Glucose, Bld: 155 mg/dL — ABNORMAL HIGH (ref 70–99)
Potassium: 3.9 mmol/L (ref 3.5–5.1)
Sodium: 133 mmol/L — ABNORMAL LOW (ref 135–145)

## 2019-06-04 NOTE — Progress Notes (Signed)
Physical Therapy Treatment Patient Details Name: Devin [name] MRN: [id number] DOB: 01-10-1936 Today's Date: 06/04/2019    History of Present Illness Patient is 83 y.o. male s/p Rt TKA on 06/03/19 with PMH significant for HTN.    PT Comments    POD # 1 am session Pt was OOB in recliner with family member at bed side.  Assisted with amb and practiced one step pt has to enter his home.  Pt lives home alone but plans to have help after D/C.  General transfer comment: 25% VC's on proper hand placement and 50% VC's on safety with turns.General Gait Details: had Family member "hands on" assist pt with gait while giving pt 50% VC's on proper upright posture and proper walker to self distance. Then returned to room to perform some TE's following HEP handout.  Instructed on proper tech, freq as well as use of ICE.   Addressed all mobility questions, discussed appropriate activity, educated on use of ICE.  Pt ready for D/C to home.     Follow Up Recommendations  Follow surgeon's recommendation for DC plan and follow-up therapies     Equipment Recommendations  None recommended by PT    Recommendations for Other Services       Precautions / Restrictions Precautions Precautions: Fall;Knee Precaution Comments: instructed no pillow under knee and educated on blue foam block Restrictions Weight Bearing Restrictions: No Other Position/Activity Restrictions: WBAT    Mobility  Bed Mobility               General bed mobility comments: OOB in recliner  Transfers Overall transfer level: Needs assistance Equipment used: Rolling walker (2 wheeled) Transfers: Sit to/from Stand Sit to Stand: Supervision;Min guard         General transfer comment: 25% VC's on proper hand placement and 50% VC's on safety with turns  Ambulation/Gait Ambulation/Gait assistance: Supervision;Min guard Gait Distance (Feet): 75 Feet Assistive device: Rolling walker (2 wheeled) Gait Pattern/deviations:  Step-through pattern;Decreased stride length;Decreased stance time - right;Decreased step length - left;Narrow base of support;Drifts right/left Gait velocity: decreased   General Gait Details: had Family member "hands on" assist pt with gait while giving pt 50% VC's on proper upright posture and proper walker to self distance.   Stairs Stairs: Yes Stairs assistance: Min guard;Min assist Stair Management: No rails;Step to pattern;Forwards;With walker Number of Stairs: 1 General stair comments: with family member "hands on" assisted and 50% VC's on proper walker placement and proper sequencing.   Wheelchair Mobility    Modified Rankin (Stroke Patients Only)       Balance                                            Cognition Arousal/Alertness: Awake/alert Behavior During Therapy: WFL for tasks assessed/performed Overall Cognitive Status: Within Functional Limits for tasks assessed                                 General Comments: HOH impulsive      Exercises   Total Knee Replacement TE's 10 reps B LE ankle pumps 10 reps towel squeezes 10 reps knee presses 10 reps heel slides  10 reps SAQ's 10 reps SLR's 10 reps ABD Followed by ICE     General Comments        Pertinent  Vitals/Pain Pain Assessment: 0-10 Pain Score: 3  Pain Location: Rt knee Pain Descriptors / Indicators: Grimacing;Guarding Pain Intervention(s): Monitored during session;Premedicated before session;Repositioned;Ice applied    Home Living                      Prior Function            PT Goals (current goals can now be found in the care plan section) Progress towards PT goals: Progressing toward goals    Frequency    7X/week      PT Plan Current plan remains appropriate    Co-evaluation              AM-PAC PT "6 Clicks" Mobility   Outcome Measure  Help needed turning from your back to your side while in a flat bed without using  bedrails?: A Little Help needed moving from lying on your back to sitting on the side of a flat bed without using bedrails?: A Little Help needed moving to and from a bed to a chair (including a wheelchair)?: A Little Help needed standing up from a chair using your arms (e.g., wheelchair or bedside chair)?: A Little Help needed to walk in hospital room?: A Little Help needed climbing 3-5 steps with a railing? : A Little 6 Click Score: 18    End of Session Equipment Utilized During Treatment: Gait belt Activity Tolerance: Patient tolerated treatment well Patient left: with call bell/phone within reach;in chair;with family/visitor present;with chair alarm set Nurse Communication: Mobility status(pt ready for D/C to home after one PT session as Family was present for education) PT Visit Diagnosis: Muscle weakness (generalized) (M62.81);Difficulty in walking, not elsewhere classified (R26.2)     Time: 0865-7846 PT Time Calculation (min) (ACUTE ONLY): 24 min  Charges:  $Gait Training: 8-22 mins $Therapeutic Exercise: 8-22 mins                     Rica Koyanagi  PTA Acute  Rehabilitation Services Pager      [phone] Office      [phone]

## 2019-06-04 NOTE — Progress Notes (Signed)
Pt d/c home with family in stable condition. No needs at time of d/c. rn did encourage family to remind pt of how to properly use walker. Pt denied pain at time of d/c. Pt did not need dme.

## 2019-06-04 NOTE — Discharge Summary (Signed)
SPORTS MEDICINE & JOINT REPLACEMENT   Devin Spurling, MD   Devin Nancy, PA-C [address] [place], [place], [place]  [address]                             [phone]  PATIENT ID: Devin [name]        MRN:  [id number]          DOB/AGE: 1935/10/27 / 83 y.o.    DISCHARGE SUMMARY  ADMISSION DATE:    06/03/2019 DISCHARGE DATE:   06/04/2019   ADMISSION DIAGNOSIS: OSTEOARTHRITIS RIGHT KNEE    DISCHARGE DIAGNOSIS:  OSTEOARTHRITIS RIGHT KNEE    ADDITIONAL DIAGNOSIS: Active Problems:   Primary localized osteoarthritis of right knee  Past Medical History:  Diagnosis Date  . Hypertension   . Hypertension     PROCEDURE: Procedure(s): TOTAL KNEE ARTHROPLASTY, ORIF MEDIAL CONDYLE, LATERAL RELEASE on 06/03/2019  CONSULTS:    HISTORY:  See H&P in chart  HOSPITAL COURSE:  Devin [name] is a 83 y.o. admitted on 06/03/2019 and found to have a diagnosis of OSTEOARTHRITIS RIGHT KNEE.  After appropriate laboratory studies were obtained  they were taken to the operating room on 06/03/2019 and underwent Procedure(s): TOTAL KNEE ARTHROPLASTY, ORIF MEDIAL CONDYLE, LATERAL RELEASE.   They were given perioperative antibiotics:  Anti-infectives (From admission, onward)   Start     Dose/Rate Route Frequency Ordered Stop   06/03/19 1400  ceFAZolin (ANCEF) IVPB 1 g/50 mL premix     1 g 100 mL/hr over 30 Minutes Intravenous Every 6 hours 06/03/19 1242 06/03/19 2041   06/03/19 0600  ceFAZolin (ANCEF) IVPB 2g/100 mL premix     2 g 200 mL/hr over 30 Minutes Intravenous On call to O.R. 06/03/19 6734 06/03/19 0748    .  Patient given tranexamic acid IV or topical and exparel intra-operatively.  Tolerated the procedure well.    POD# 1: Vital signs were stable.  Patient denied Chest pain, shortness of breath, or calf pain.  Patient was started on Aspirin twice daily at 8am.  Consults to PT, OT, and care management were made.  The patient was weight bearing as tolerated.  CPM was placed on the  operative leg 0-90 degrees for 6-8 hours a day. When out of the CPM, patient was placed in the foam block to achieve full extension. Incentive spirometry was taught.  Dressing was changed.       POD #2, Continued  PT for ambulation and exercise program.  IV saline locked.  O2 discontinued.    The remainder of the hospital course was dedicated to ambulation and strengthening.   The patient was discharged on 1 Day Post-Op in  Good condition.  Blood products given:none  DIAGNOSTIC STUDIES: Recent vital signs:  Patient Vitals for the past 24 hrs:  BP Temp Temp src Pulse Resp SpO2  06/04/19 0520 126/73 97.6 F (36.4 C) - 69 17 97 %  06/04/19 0146 128/65 97.7 F (36.5 C) Oral 69 16 98 %  06/03/19 2109 133/78 97.8 F (36.6 C) Oral 72 16 96 %  06/03/19 1740 129/72 97.6 F (36.4 C) - 65 15 97 %  06/03/19 1529 (!) 167/82 (!) 97.3 F (36.3 C) Oral 72 14 98 %  06/03/19 1431 (!) 181/88 98 F (36.7 C) - 68 16 98 %  06/03/19 1331 (!) 151/74 97.6 F (36.4 C) - 66 17 98 %  06/03/19 1232 (!) 164/81 97.8 F (36.6 C)  Oral 64 - 97 %  06/03/19 1200 (!) 152/77 97.6 F (36.4 C) - 61 12 96 %  06/03/19 1145 (!) 162/74 - - 63 13 95 %  06/03/19 1130 (!) 150/73 - - 61 12 95 %  06/03/19 1115 137/72 - - (!) 58 12 100 %  06/03/19 1100 128/68 (!) 97 F (36.1 C) - 66 16 95 %       Recent laboratory studies: Recent Labs    06/04/19 0212  WBC 10.8*  HGB 13.1  HCT 38.8*  PLT 175   Recent Labs    06/04/19 0212  NA 133*  K 3.9  CL 99  CO2 24  BUN 16  CREATININE 0.97  GLUCOSE 155*  CALCIUM 8.6*   Lab Results  Component Value Date   INR 1.0 05/25/2019     Recent Radiographic Studies :  Dg Knee 1-2 Views Right  Result Date: 06/03/2019 CLINICAL DATA:  Intraoperative imaging for right knee replacement. EXAM: RIGHT KNEE - 1-2 VIEW COMPARISON:  None. FINDINGS: Four fluoroscopic intraoperative spot views of the right knee are provided. 2 screws fixing a remote distal femur fracture are noted.  Final images demonstrate a knee arthroplasty in place. No acute abnormality is identified. IMPRESSION: Intraoperative imaging for right knee replacement. Electronically Signed   By: Inge Rise M.D.   On: 06/03/2019 10:49   Nm Myocar Multi W/spect W/wall Motion / Ef  Result Date: 05/31/2019  There was no ST segment deviation noted during stress.  The study is normal. There are no perfusion defects consistent with prior infarct or current ischemia.  This is a low risk study.  The left ventricular ejection fraction is normal (55-65%).    Dg C-arm 1-60 Min-no Report  Result Date: 06/03/2019 Fluoroscopy was utilized by the requesting physician.  No radiographic interpretation.    DISCHARGE INSTRUCTIONS:   DISCHARGE MEDICATIONS:     FOLLOW UP VISIT:   Follow-up Information    Earlie Server, MD. Go on 06/17/2019.   Specialty: Orthopedic Surgery Why: Your appointment has been scheduled for 11:50 in our [facility] office  Contact information: [address] [place] [address] [place] Follow up.   Why: You will be seen by HHPT for 5 visits prior to starting outpatient physical therapy  Contact information: [address] [place] [address] [phone]        [facility], Therapy. Go on 06/16/2019.   Why: You are scheduled to start outpatient physical therapy at 10:00. Please arrive at 9:45 to complete your paperwork.  Contact information: [address] [place] [address] [phone]           DISPOSITION: HOME VS. SNF  CONDITION:  Good   Donia Ast 06/04/2019, 8:09 AM

## 2019-06-04 NOTE — Evaluation (Signed)
Occupational Therapy Evaluation and DC Patient Details Name: Devin [name] MRN: [id number] DOB: September 18, 1935 Today's Date: 06/04/2019    History of Present Illness Patient is 83 y.o. male s/p Rt TKA on 06/03/19 with PMH significant for HTN.   Clinical Impression   OT eval and education complete.  Pt will have 24.7 A.  Pt will need VC for safety for all ADL activity    Follow Up Recommendations  Supervision/Assistance - 24 hour          Precautions / Restrictions Precautions Precautions: Fall;Knee Precaution Comments: instructed no pillow under knee and educated on blue foam block Restrictions Weight Bearing Restrictions: No Other Position/Activity Restrictions: WBAT      Mobility Bed Mobility               General bed mobility comments: OOB in recliner  Transfers Overall transfer level: Needs assistance Equipment used: Rolling walker (2 wheeled) Transfers: Sit to/from Stand Sit to Stand: Supervision;Min guard              Balance Overall balance assessment: Needs assistance Sitting-balance support: Feet supported Sitting balance-Leahy Scale: Good     Standing balance support: During functional activity;Bilateral upper extremity supported Standing balance-Leahy Scale: Poor                             ADL either performed or assessed with clinical judgement   ADL Overall ADL's : Needs assistance/impaired     Grooming: Wash/dry face;Standing;Minimal assistance                   Toilet Transfer: Minimal assistance;RW;Cueing for safety;Cueing for sequencing;Ambulation   Toileting- Clothing Manipulation and Hygiene: Minimal assistance;Sit to/from stand;Cueing for safety;Cueing for sequencing   Tub/ Shower Transfer: Walk-in shower;Minimal assistance   Functional mobility during ADLs: Minimal assistance General ADL Comments: pt is impulsive and needs VC to slow does and use walker safely                  Pertinent  Vitals/Pain Pain Score: 3  Pain Location: Rt knee Pain Descriptors / Indicators: Grimacing;Guarding     Hand Dominance Left   Extremity/Trunk Assessment     Lower Extremity Assessment RLE Deficits / Details: pt with no extensor lag with SLR, mild knee flexion in stance RLE Sensation: WNL RLE Coordination: WNL   Cervical / Trunk Assessment Cervical / Trunk Assessment: Kyphotic   Communication Communication Communication: HOH   Cognition Arousal/Alertness: Awake/alert Behavior During Therapy: WFL for tasks assessed/performed Overall Cognitive Status: Within Functional Limits for tasks assessed                                 General Comments: HOH impulsive   General Comments               Home Living Family/patient expects to be discharged to:: Private residence Living Arrangements: Alone Available Help at Discharge: Family;Available 24 hours/day Type of Home: House Home Access: Stairs to enter [employer] of Steps: 1 Entrance Stairs-Rails: None Home Layout: Able to live on main level with bedroom/bathroom;Laundry or work area in basement;One level Alternate Level Stairs-Number of Steps: pt has a chair lift for stairs to basement (he got it for his dogs to ride up/down the stairs because they typically stay downstairs and have trouble getting up the stairs)   Bathroom Shower/Tub: Arts development officer  Toilet: Standard Bathroom Accessibility: Yes   Home Equipment: Walker - 2 wheels;Cane - single point;Bedside commode          Prior Functioning/Environment Level of Independence: Independent        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        OT Problem List: Decreased strength;Impaired balance (sitting and/or standing)         OT Goals(Current goals can be found in the care plan section) Acute Rehab OT Goals Patient Stated Goal: get home and back to work OT Goal Formulation: With patient  OT Frequency:      AM-PAC OT "6  Clicks" Daily Activity     Outcome Measure Help from another person eating meals?: None Help from another person taking care of personal grooming?: A Little Help from another person toileting, which includes using toliet, bedpan, or urinal?: A Little Help from another person bathing (including washing, rinsing, drying)?: A Little Help from another person to put on and taking off regular upper body clothing?: None Help from another person to put on and taking off regular lower body clothing?: A Little 6 Click Score: 20   End of Session Equipment Utilized During Treatment: Rolling walker Nurse Communication: Mobility status  Activity Tolerance: Patient tolerated treatment well Patient left: in chair  OT Visit Diagnosis: Unsteadiness on feet (R26.81);Muscle weakness (generalized) (M62.81);Other abnormalities of gait and mobility (R26.89)                Time: 0120-0135 OT Time Calculation (min): 15 min Charges:  OT General Charges $OT Visit: 1 Visit OT Evaluation $OT Eval Low Complexity: 1 Low  Kari Baars, OT Acute Rehabilitation Services [phone] Office- [phone], Edwena Felty D 06/04/2019, 4:28 PM

## 2019-06-04 NOTE — Care Management Obs Status (Signed)
[facility] NOTIFICATION   Patient Details  Name: Devin [name] MRN: [id number] Date of Birth: 09-17-1935   Medicare Observation Status Notification Given:  Yes    Joaquin Courts, RN 06/04/2019, 9:59 AM

## 2019-06-04 NOTE — Plan of Care (Signed)
Pt stable at this time and did well with physical therapy. Family at bedside. Rn instructed pt and family that pt needs to urinate prior to d/c home. Pt reports managed pain and appears to be comfortable in the chair.

## 2019-06-04 NOTE — Progress Notes (Signed)
SPORTS MEDICINE AND JOINT REPLACEMENT  Georgena Spurling, MD    Laurier Nancy, PA-C [address] [place], [place], [place]  [address]                             [phone]   PROGRESS NOTE  Subjective:  negative for Chest Pain  negative for Shortness of Breath  negative for Nausea/Vomiting   negative for Calf Pain  negative for Bowel Movement   Tolerating Diet: yes         Patient reports pain as 2 on 0-10 scale.    Objective: Vital signs in last 24 hours:    Patient Vitals for the past 24 hrs:  BP Temp Temp src Pulse Resp SpO2  06/04/19 0520 126/73 97.6 F (36.4 C) - 69 17 97 %  06/04/19 0146 128/65 97.7 F (36.5 C) Oral 69 16 98 %  06/03/19 2109 133/78 97.8 F (36.6 C) Oral 72 16 96 %  06/03/19 1740 129/72 97.6 F (36.4 C) - 65 15 97 %  06/03/19 1529 (!) 167/82 (!) 97.3 F (36.3 C) Oral 72 14 98 %  06/03/19 1431 (!) 181/88 98 F (36.7 C) - 68 16 98 %  06/03/19 1331 (!) 151/74 97.6 F (36.4 C) - 66 17 98 %  06/03/19 1232 (!) 164/81 97.8 F (36.6 C) Oral 64 - 97 %  06/03/19 1200 (!) 152/77 97.6 F (36.4 C) - 61 12 96 %  06/03/19 1145 (!) 162/74 - - 63 13 95 %  06/03/19 1130 (!) 150/73 - - 61 12 95 %  06/03/19 1115 137/72 - - (!) 58 12 100 %  06/03/19 1100 128/68 (!) 97 F (36.1 C) - 66 16 95 %    @flow {1959:LAST@   Intake/Output from previous day:   12/04 0701 - 12/05 0700 In: 4012.5 [P.O.:1200; I.V.:2412.5] Out: 2800 [Urine:2650]   Intake/Output this shift:   No intake/output data recorded.   Intake/Output      12/04 0701 - 12/05 0700 12/05 0701 - 12/06 0700   P.O. 1200    I.V. (mL/kg) 2412.5 (30)    IV Piggyback 400    Total Intake(mL/kg) 4012.5 (50)    Urine (mL/kg/hr) 2650 (1.4)    Emesis/NG output 0    Stool 0    Blood 150    Total Output 2800    Net +1212.5         Stool Occurrence 0 x    Emesis Occurrence 0 x       LABORATORY DATA: Recent Labs    06/04/19 0212  WBC 10.8*  HGB 13.1  HCT 38.8*  PLT 175   Recent Labs     06/04/19 0212  NA 133*  K 3.9  CL 99  CO2 24  BUN 16  CREATININE 0.97  GLUCOSE 155*  CALCIUM 8.6*   Lab Results  Component Value Date   INR 1.0 05/25/2019    Examination:  General appearance: alert, cooperative and no distress Extremities: extremities normal, atraumatic, no cyanosis or edema  Wound Exam: clean, dry, intact   Drainage:  None: wound tissue dry  Motor Exam: Quadriceps and Hamstrings Intact  Sensory Exam: Superficial Peroneal, Deep Peroneal and Tibial normal   Assessment:    1 Day Post-Op  Procedure(s) (LRB): TOTAL KNEE ARTHROPLASTY, ORIF MEDIAL CONDYLE, LATERAL RELEASE (Right)  ADDITIONAL DIAGNOSIS:  Active Problems:   Primary localized osteoarthritis of right knee     Plan: Physical  Therapy as ordered Weight Bearing as Tolerated (WBAT)  DVT Prophylaxis:  Aspirin  DISCHARGE PLAN: Home  Patient doing great. Plan for D/C home today       Patient's anticipated LOS is less than 2 midnights, meeting these requirements: - Lives within 1 hour of care - Has a competent adult at home to recover with post-op recover - NO history of  - Chronic pain requiring opiods  - Diabetes  - Coronary Artery Disease  - Heart failure  - Heart attack  - Stroke  - DVT/VTE  - Cardiac arrhythmia  - Respiratory Failure/COPD  - Renal failure  - Anemia  - Advanced Liver disease        Donia Ast 06/04/2019, 8:08 AM

## 2019-06-04 NOTE — TOC Progression Note (Signed)
Transition of Care [facility]) - Progression Note    Patient Details  Name: Devin [name] MRN: [id number] Date of Birth: [birth year]  [facility] of [facility]) CM/SW Contact  Joaquin Courts, RN Phone Number: 06/04/2019, 12:03 PM  Clinical Narrative:   CM spoke with patient at bedside. Patient set up with [facility] for [place]. Patient reports has rolling walker and 3-in-1 at home for use.     Expected Discharge Plan: [place] Barriers to Discharge: No Barriers Identified  Expected Discharge Plan and Services Expected Discharge Plan: [place]   Discharge Planning Services: CM Consult Post Acute Care Choice: [place] arrangements for the past 2 months: Single Family Home Expected Discharge Date: 06/04/19               DME Arranged: N/A DME Agency: NA       HH Arranged: PT [place] Agency: [place]         Social Determinants of Health (SDOH) Interventions    Readmission Risk Interventions No flowsheet data found.

## 2019-06-06 ENCOUNTER — Encounter (HOSPITAL_COMMUNITY): Payer: Self-pay | Admitting: Orthopedic Surgery

## 2019-06-06 DIAGNOSIS — G4733 Obstructive sleep apnea (adult) (pediatric): Secondary | ICD-10-CM | POA: Diagnosis not present

## 2019-06-06 DIAGNOSIS — Z471 Aftercare following joint replacement surgery: Secondary | ICD-10-CM | POA: Diagnosis not present

## 2019-06-06 DIAGNOSIS — Z96651 Presence of right artificial knee joint: Secondary | ICD-10-CM | POA: Diagnosis not present

## 2019-06-06 DIAGNOSIS — I10 Essential (primary) hypertension: Secondary | ICD-10-CM | POA: Diagnosis not present

## 2019-06-06 DIAGNOSIS — M1712 Unilateral primary osteoarthritis, left knee: Secondary | ICD-10-CM | POA: Diagnosis not present

## 2019-06-06 DIAGNOSIS — E785 Hyperlipidemia, unspecified: Secondary | ICD-10-CM | POA: Diagnosis not present

## 2019-06-08 DIAGNOSIS — E785 Hyperlipidemia, unspecified: Secondary | ICD-10-CM | POA: Diagnosis not present

## 2019-06-08 DIAGNOSIS — Z96651 Presence of right artificial knee joint: Secondary | ICD-10-CM | POA: Diagnosis not present

## 2019-06-08 DIAGNOSIS — Z471 Aftercare following joint replacement surgery: Secondary | ICD-10-CM | POA: Diagnosis not present

## 2019-06-08 DIAGNOSIS — M1712 Unilateral primary osteoarthritis, left knee: Secondary | ICD-10-CM | POA: Diagnosis not present

## 2019-06-08 DIAGNOSIS — I10 Essential (primary) hypertension: Secondary | ICD-10-CM | POA: Diagnosis not present

## 2019-06-08 DIAGNOSIS — G4733 Obstructive sleep apnea (adult) (pediatric): Secondary | ICD-10-CM | POA: Diagnosis not present

## 2019-06-10 DIAGNOSIS — I10 Essential (primary) hypertension: Secondary | ICD-10-CM | POA: Diagnosis not present

## 2019-06-10 DIAGNOSIS — Z96651 Presence of right artificial knee joint: Secondary | ICD-10-CM | POA: Diagnosis not present

## 2019-06-10 DIAGNOSIS — Z471 Aftercare following joint replacement surgery: Secondary | ICD-10-CM | POA: Diagnosis not present

## 2019-06-10 DIAGNOSIS — G4733 Obstructive sleep apnea (adult) (pediatric): Secondary | ICD-10-CM | POA: Diagnosis not present

## 2019-06-10 DIAGNOSIS — M1711 Unilateral primary osteoarthritis, right knee: Secondary | ICD-10-CM | POA: Diagnosis not present

## 2019-06-10 DIAGNOSIS — M1712 Unilateral primary osteoarthritis, left knee: Secondary | ICD-10-CM | POA: Diagnosis not present

## 2019-06-10 DIAGNOSIS — E785 Hyperlipidemia, unspecified: Secondary | ICD-10-CM | POA: Diagnosis not present

## 2019-06-11 DIAGNOSIS — M1711 Unilateral primary osteoarthritis, right knee: Secondary | ICD-10-CM | POA: Diagnosis not present

## 2019-06-12 DIAGNOSIS — M1711 Unilateral primary osteoarthritis, right knee: Secondary | ICD-10-CM | POA: Diagnosis not present

## 2019-06-16 DIAGNOSIS — M1712 Unilateral primary osteoarthritis, left knee: Secondary | ICD-10-CM | POA: Diagnosis not present

## 2019-06-16 DIAGNOSIS — E785 Hyperlipidemia, unspecified: Secondary | ICD-10-CM | POA: Diagnosis not present

## 2019-06-16 DIAGNOSIS — I10 Essential (primary) hypertension: Secondary | ICD-10-CM | POA: Diagnosis not present

## 2019-06-16 DIAGNOSIS — G4733 Obstructive sleep apnea (adult) (pediatric): Secondary | ICD-10-CM | POA: Diagnosis not present

## 2019-06-16 DIAGNOSIS — Z96651 Presence of right artificial knee joint: Secondary | ICD-10-CM | POA: Diagnosis not present

## 2019-06-16 DIAGNOSIS — Z471 Aftercare following joint replacement surgery: Secondary | ICD-10-CM | POA: Diagnosis not present

## 2019-06-17 ENCOUNTER — Ambulatory Visit (HOSPITAL_COMMUNITY)
Admission: RE | Admit: 2019-06-17 | Discharge: 2019-06-17 | Disposition: A | Discharge status: Home / Self Care | Payer: Medicare Other | Source: Ambulatory Visit | Attending: Orthopedic Surgery | Admitting: Orthopedic Surgery

## 2019-06-17 ENCOUNTER — Other Ambulatory Visit: Payer: Self-pay

## 2019-06-17 ENCOUNTER — Other Ambulatory Visit (HOSPITAL_COMMUNITY): Payer: Self-pay | Admitting: Orthopedic Surgery

## 2019-06-17 DIAGNOSIS — M25461 Effusion, right knee: Secondary | ICD-10-CM | POA: Diagnosis present

## 2019-06-17 DIAGNOSIS — M25561 Pain in right knee: Secondary | ICD-10-CM | POA: Insufficient documentation

## 2019-06-18 DIAGNOSIS — E785 Hyperlipidemia, unspecified: Secondary | ICD-10-CM | POA: Diagnosis not present

## 2019-06-18 DIAGNOSIS — I10 Essential (primary) hypertension: Secondary | ICD-10-CM | POA: Diagnosis not present

## 2019-06-18 DIAGNOSIS — Z471 Aftercare following joint replacement surgery: Secondary | ICD-10-CM | POA: Diagnosis not present

## 2019-06-18 DIAGNOSIS — M1712 Unilateral primary osteoarthritis, left knee: Secondary | ICD-10-CM | POA: Diagnosis not present

## 2019-06-18 DIAGNOSIS — G4733 Obstructive sleep apnea (adult) (pediatric): Secondary | ICD-10-CM | POA: Diagnosis not present

## 2019-06-18 DIAGNOSIS — Z96651 Presence of right artificial knee joint: Secondary | ICD-10-CM | POA: Diagnosis not present

## 2019-06-21 DIAGNOSIS — E785 Hyperlipidemia, unspecified: Secondary | ICD-10-CM | POA: Diagnosis not present

## 2019-06-21 DIAGNOSIS — I10 Essential (primary) hypertension: Secondary | ICD-10-CM | POA: Diagnosis not present

## 2019-06-21 DIAGNOSIS — M1712 Unilateral primary osteoarthritis, left knee: Secondary | ICD-10-CM | POA: Diagnosis not present

## 2019-06-21 DIAGNOSIS — Z96651 Presence of right artificial knee joint: Secondary | ICD-10-CM | POA: Diagnosis not present

## 2019-06-21 DIAGNOSIS — G4733 Obstructive sleep apnea (adult) (pediatric): Secondary | ICD-10-CM | POA: Diagnosis not present

## 2019-06-21 DIAGNOSIS — Z471 Aftercare following joint replacement surgery: Secondary | ICD-10-CM | POA: Diagnosis not present

## 2019-06-22 DIAGNOSIS — Z471 Aftercare following joint replacement surgery: Secondary | ICD-10-CM | POA: Diagnosis not present

## 2019-06-22 DIAGNOSIS — G4733 Obstructive sleep apnea (adult) (pediatric): Secondary | ICD-10-CM | POA: Diagnosis not present

## 2019-06-22 DIAGNOSIS — Z96651 Presence of right artificial knee joint: Secondary | ICD-10-CM | POA: Diagnosis not present

## 2019-06-22 DIAGNOSIS — M1712 Unilateral primary osteoarthritis, left knee: Secondary | ICD-10-CM | POA: Diagnosis not present

## 2019-06-22 DIAGNOSIS — E785 Hyperlipidemia, unspecified: Secondary | ICD-10-CM | POA: Diagnosis not present

## 2019-06-22 DIAGNOSIS — I10 Essential (primary) hypertension: Secondary | ICD-10-CM | POA: Diagnosis not present

## 2019-06-24 DIAGNOSIS — R4182 Altered mental status, unspecified: Secondary | ICD-10-CM | POA: Diagnosis not present

## 2019-07-07 DIAGNOSIS — L03115 Cellulitis of right lower limb: Secondary | ICD-10-CM | POA: Diagnosis not present

## 2019-07-11 DIAGNOSIS — I10 Essential (primary) hypertension: Secondary | ICD-10-CM | POA: Diagnosis not present

## 2019-07-11 DIAGNOSIS — U071 COVID-19: Secondary | ICD-10-CM | POA: Diagnosis not present

## 2019-07-28 ENCOUNTER — Encounter: Payer: Self-pay | Admitting: Plastic Surgery

## 2019-07-28 ENCOUNTER — Other Ambulatory Visit: Payer: Self-pay

## 2019-07-28 ENCOUNTER — Ambulatory Visit (INDEPENDENT_AMBULATORY_CARE_PROVIDER_SITE_OTHER): Payer: Medicare Other | Admitting: Plastic Surgery

## 2019-07-28 VITALS — BP 215/125 | HR 106 | Temp 97.1°F | Ht 66.0 in | Wt 169.8 lb

## 2019-07-28 DIAGNOSIS — S81001A Unspecified open wound, right knee, initial encounter: Secondary | ICD-10-CM | POA: Diagnosis not present

## 2019-07-28 NOTE — Progress Notes (Signed)
Referring Provider Toma Deiters, MD [address] [place],  [place] [address]   CC:  Chief Complaint  Patient presents with  . Advice Only    for (R) knee necrosis      Devin [name] is an 84 y.o. male.  HPI: Patient is here with a complication after total knee arthroplasty.  He was sent by his orthopedic surgeon Dr. Madelon Lips.  He had a total knee replacement about 2 months ago.  Shortly thereafter he developed erythema and swelling in the knee and subsequently had wound breakdown.  He developed a black eschar along the wound edge that has since sloughed off leaving exposed patellar tendon in the wound bed.  He believes he has had some testing done to evaluate for deeper space infection but is unsure exactly what that was.  He is very unhappy overall and has not been able to bear weight on the leg he says.  He does have pretty good range of motion of the knee however.  No Known Allergies  Outpatient Encounter Medications as of 07/28/2019  Medication Sig Note  . chlorthalidone (HYGROTON) 25 MG tablet Take 25 mg by mouth daily. 06/03/2019: Patient states he does not take  . [facility] D3 25 MCG (1000 UT) capsule Take 1,000 Units by mouth daily.   Marland Kitchen labetalol (NORMODYNE) 300 MG tablet Take 300 mg by mouth 2 (two) times daily.   Marland Kitchen losartan (COZAAR) 100 MG tablet Take 100 mg by mouth daily.   Marland Kitchen acetaminophen (TYLENOL) 325 MG tablet Take 2 tablets (650 mg total) by mouth every 4 (four) hours as needed. (Patient not taking: Reported on 07/28/2019)   . [DISCONTINUED] amLODipine (NORVASC) 5 MG tablet Take 5 mg by mouth daily.   . [DISCONTINUED] gabapentin (NEURONTIN) 300 MG capsule Take 1 capsule (300 mg total) by mouth 3 (three) times daily for 14 days, THEN 1 capsule (300 mg total) 2 (two) times daily for 3 days, THEN 1 capsule (300 mg total) daily for 4 days.   . [DISCONTINUED] metoprolol succinate (TOPROL-XL) 100 MG 24 hr tablet Take 100 mg by mouth daily.   . [DISCONTINUED] oxyCODONE (OXY  IR/ROXICODONE) 5 MG immediate release tablet Take one tab po q4-6hrs prn pain, may need 1-2 first couple weeks    No facility-administered encounter medications on file as of 07/28/2019.     Past Medical History:  Diagnosis Date  . Hypertension   . Hypertension     Past Surgical History:  Procedure Laterality Date  . CLOSED REDUCTION FEMORAL SHAFT FRACTURE Left   . TOTAL KNEE ARTHROPLASTY Right 06/03/2019   Procedure: TOTAL KNEE ARTHROPLASTY, ORIF MEDIAL CONDYLE, LATERAL RELEASE;  Surgeon: Frederico Hamman, MD;  Location: [facility];  Service: Orthopedics;  Laterality: Right;    Family History  Problem Relation Age of Onset  . Stroke Mother     Social History   Social History Narrative  . Not on file  Denies tobacco use  Review of Systems General: Denies fevers, chills, weight loss CV: Denies chest pain, shortness of breath, palpitations  Physical Exam Vitals with BMI 07/28/2019 06/04/2019 06/04/2019  Height 5\' 6"  - -  Weight 169 lbs 13 oz - -  BMI 27.42 - -  Systolic 215 112  Diastolic 125 65 82  Pulse 106 64 72    General:  No acute distress,  Alert and oriented, Non-Toxic, Normal speech and affect Right knee: He has a central wound dehiscence with exposed patellar tendon.  The wound  overall is about 7 cm x 5 cm.  There is some fibrinous exudate at the base of the wound.  I do not see a lot of drainage.  There is minimal surrounding erythema at this point.  He is able to straighten it fully and flex the knee but not quite to 90 degrees.  He has quite a bit of pain with loading the knee.  There is no scars on his calf.  Assessment/Plan Patient presents with a wound dehiscence after total knee arthroplasty.  I explained to complex situation.  I want to speak to his orthopedic surgeon to see what work-up has been done to rule out a prosthetic related infection.  If there is no concern for deeper is infection then I think it medial pedicled gastroc with a skin graft would be the  best surgical solution.  I explained to the patient we call him back after I speak with his orthopedic surgeon.  Cindra Presume 07/28/2019, 5:57 PM

## 2019-07-30 ENCOUNTER — Emergency Department (HOSPITAL_COMMUNITY): Payer: Medicare Other

## 2019-07-30 ENCOUNTER — Inpatient Hospital Stay (HOSPITAL_COMMUNITY)
Admission: EM | Admit: 2019-07-30 | Discharge: 2019-08-11 | DRG: 464 | Disposition: A | Discharge status: Home Health | Payer: Medicare Other | Attending: Orthopedic Surgery | Admitting: Orthopedic Surgery

## 2019-07-30 ENCOUNTER — Encounter (HOSPITAL_COMMUNITY): Payer: Self-pay | Admitting: Emergency Medicine

## 2019-07-30 ENCOUNTER — Other Ambulatory Visit: Payer: Self-pay

## 2019-07-30 ENCOUNTER — Inpatient Hospital Stay (HOSPITAL_COMMUNITY): Payer: Medicare Other

## 2019-07-30 ENCOUNTER — Other Ambulatory Visit (HOSPITAL_COMMUNITY): Payer: Self-pay | Admitting: Physician Assistant

## 2019-07-30 DIAGNOSIS — Z79899 Other long term (current) drug therapy: Secondary | ICD-10-CM

## 2019-07-30 DIAGNOSIS — M25561 Pain in right knee: Secondary | ICD-10-CM

## 2019-07-30 DIAGNOSIS — I10 Essential (primary) hypertension: Secondary | ICD-10-CM | POA: Diagnosis present

## 2019-07-30 DIAGNOSIS — T8459XA Infection and inflammatory reaction due to other internal joint prosthesis, initial encounter: Secondary | ICD-10-CM | POA: Diagnosis not present

## 2019-07-30 DIAGNOSIS — T8453XS Infection and inflammatory reaction due to internal right knee prosthesis, sequela: Secondary | ICD-10-CM

## 2019-07-30 DIAGNOSIS — Z9889 Other specified postprocedural states: Secondary | ICD-10-CM | POA: Diagnosis not present

## 2019-07-30 DIAGNOSIS — T8453XD Infection and inflammatory reaction due to internal right knee prosthesis, subsequent encounter: Secondary | ICD-10-CM | POA: Diagnosis not present

## 2019-07-30 DIAGNOSIS — M00861 Arthritis due to other bacteria, right knee: Secondary | ICD-10-CM | POA: Diagnosis not present

## 2019-07-30 DIAGNOSIS — M659 Synovitis and tenosynovitis, unspecified: Secondary | ICD-10-CM | POA: Diagnosis not present

## 2019-07-30 DIAGNOSIS — E785 Hyperlipidemia, unspecified: Secondary | ICD-10-CM | POA: Diagnosis present

## 2019-07-30 DIAGNOSIS — T8130XA Disruption of wound, unspecified, initial encounter: Secondary | ICD-10-CM | POA: Diagnosis not present

## 2019-07-30 DIAGNOSIS — Y831 Surgical operation with implant of artificial internal device as the cause of abnormal reaction of the patient, or of later complication, without mention of misadventure at the time of the procedure: Secondary | ICD-10-CM | POA: Diagnosis present

## 2019-07-30 DIAGNOSIS — M1711 Unilateral primary osteoarthritis, right knee: Secondary | ICD-10-CM | POA: Diagnosis present

## 2019-07-30 DIAGNOSIS — I96 Gangrene, not elsewhere classified: Secondary | ICD-10-CM | POA: Diagnosis not present

## 2019-07-30 DIAGNOSIS — L089 Local infection of the skin and subcutaneous tissue, unspecified: Secondary | ICD-10-CM

## 2019-07-30 DIAGNOSIS — Z823 Family history of stroke: Secondary | ICD-10-CM | POA: Diagnosis not present

## 2019-07-30 DIAGNOSIS — Z8616 Personal history of COVID-19: Secondary | ICD-10-CM

## 2019-07-30 DIAGNOSIS — W19XXXA Unspecified fall, initial encounter: Secondary | ICD-10-CM | POA: Diagnosis present

## 2019-07-30 DIAGNOSIS — H9190 Unspecified hearing loss, unspecified ear: Secondary | ICD-10-CM | POA: Diagnosis present

## 2019-07-30 DIAGNOSIS — R519 Headache, unspecified: Secondary | ICD-10-CM | POA: Diagnosis not present

## 2019-07-30 DIAGNOSIS — B965 Pseudomonas (aeruginosa) (mallei) (pseudomallei) as the cause of diseases classified elsewhere: Secondary | ICD-10-CM | POA: Diagnosis not present

## 2019-07-30 DIAGNOSIS — Z20822 Contact with and (suspected) exposure to covid-19: Secondary | ICD-10-CM | POA: Diagnosis not present

## 2019-07-30 DIAGNOSIS — D649 Anemia, unspecified: Secondary | ICD-10-CM | POA: Diagnosis not present

## 2019-07-30 DIAGNOSIS — S81001A Unspecified open wound, right knee, initial encounter: Secondary | ICD-10-CM | POA: Diagnosis not present

## 2019-07-30 DIAGNOSIS — Z471 Aftercare following joint replacement surgery: Secondary | ICD-10-CM | POA: Diagnosis not present

## 2019-07-30 DIAGNOSIS — T8131XA Disruption of external operation (surgical) wound, not elsewhere classified, initial encounter: Secondary | ICD-10-CM | POA: Diagnosis not present

## 2019-07-30 DIAGNOSIS — R918 Other nonspecific abnormal finding of lung field: Secondary | ICD-10-CM | POA: Diagnosis not present

## 2019-07-30 DIAGNOSIS — G4733 Obstructive sleep apnea (adult) (pediatric): Secondary | ICD-10-CM | POA: Diagnosis not present

## 2019-07-30 DIAGNOSIS — Z96651 Presence of right artificial knee joint: Secondary | ICD-10-CM

## 2019-07-30 DIAGNOSIS — M542 Cervicalgia: Secondary | ICD-10-CM | POA: Diagnosis not present

## 2019-07-30 DIAGNOSIS — A498 Other bacterial infections of unspecified site: Secondary | ICD-10-CM

## 2019-07-30 DIAGNOSIS — K219 Gastro-esophageal reflux disease without esophagitis: Secondary | ICD-10-CM | POA: Diagnosis not present

## 2019-07-30 DIAGNOSIS — T8453XA Infection and inflammatory reaction due to internal right knee prosthesis, initial encounter: Secondary | ICD-10-CM | POA: Diagnosis not present

## 2019-07-30 DIAGNOSIS — B9689 Other specified bacterial agents as the cause of diseases classified elsewhere: Secondary | ICD-10-CM | POA: Diagnosis not present

## 2019-07-30 DIAGNOSIS — Z03818 Encounter for observation for suspected exposure to other biological agents ruled out: Secondary | ICD-10-CM | POA: Diagnosis not present

## 2019-07-30 DIAGNOSIS — S86901A Unspecified injury of unspecified muscle(s) and tendon(s) at lower leg level, right leg, initial encounter: Secondary | ICD-10-CM | POA: Diagnosis not present

## 2019-07-30 HISTORY — DX: Unspecified hearing loss, unspecified ear: H91.90

## 2019-07-30 LAB — CBC WITH DIFFERENTIAL/PLATELET
Abs Immature Granulocytes: 0.03 10*3/uL (ref 0.00–0.07)
Basophils Absolute: 0 10*3/uL (ref 0.0–0.1)
Basophils Relative: 0 %
Eosinophils Absolute: 0.1 10*3/uL (ref 0.0–0.5)
Eosinophils Relative: 1 %
HCT: 36.9 % — ABNORMAL LOW (ref 39.0–52.0)
Hemoglobin: 12 g/dL — ABNORMAL LOW (ref 13.0–17.0)
Immature Granulocytes: 0 %
Lymphocytes Relative: 15 %
Lymphs Abs: 1.1 10*3/uL (ref 0.7–4.0)
MCH: 30.4 pg (ref 26.0–34.0)
MCHC: 32.5 g/dL (ref 30.0–36.0)
MCV: 93.4 fL (ref 80.0–100.0)
Monocytes Absolute: 0.9 10*3/uL (ref 0.1–1.0)
Monocytes Relative: 12 %
Neutro Abs: 5.5 10*3/uL (ref 1.7–7.7)
Neutrophils Relative %: 72 %
Platelets: 242 10*3/uL (ref 150–400)
RBC: 3.95 MIL/uL — ABNORMAL LOW (ref 4.22–5.81)
RDW: 13.6 % (ref 11.5–15.5)
WBC: 7.6 10*3/uL (ref 4.0–10.5)
nRBC: 0 % (ref 0.0–0.2)

## 2019-07-30 LAB — BASIC METABOLIC PANEL
Anion gap: 10 (ref 5–15)
BUN: 17 mg/dL (ref 8–23)
CO2: 24 mmol/L (ref 22–32)
Calcium: 9.1 mg/dL (ref 8.9–10.3)
Chloride: 99 mmol/L (ref 98–111)
Creatinine, Ser: 1.24 mg/dL (ref 0.61–1.24)
GFR calc Af Amer: >60 mL/min (ref >60)
GFR calc non Af Amer: 54 mL/min — ABNORMAL LOW (ref >60)
Glucose, Bld: 129 mg/dL — ABNORMAL HIGH (ref 70–99)
Potassium: 3.4 mmol/L — ABNORMAL LOW (ref 3.5–5.1)
Sodium: 133 mmol/L — ABNORMAL LOW (ref 135–145)

## 2019-07-30 LAB — SYNOVIAL CELL COUNT + DIFF, W/ CRYSTALS
Crystals, Fluid: NONE SEEN
Eosinophils-Synovial: 0 % (ref 0–1)
Lymphocytes-Synovial Fld: 1 % (ref 0–20)
Monocyte-Macrophage-Synovial Fluid: 6 % — ABNORMAL LOW (ref 50–90)
Neutrophil, Synovial: 93 % — ABNORMAL HIGH (ref 0–25)
WBC, Synovial: 29250 /mm3 — ABNORMAL HIGH (ref 0–200)

## 2019-07-30 LAB — SEDIMENTATION RATE: Sed Rate: 45 mm/hr — ABNORMAL HIGH (ref 0–16)

## 2019-07-30 LAB — C-REACTIVE PROTEIN: CRP: 7 mg/dL — ABNORMAL HIGH (ref <1.0)

## 2019-07-30 LAB — ABO/RH: ABO/RH(D): O NEG

## 2019-07-30 LAB — TYPE AND SCREEN
ABO/RH(D): O NEG
Antibody Screen: NEGATIVE

## 2019-07-30 LAB — SARS CORONAVIRUS 2 (TAT 6-24 HRS): SARS Coronavirus 2: NEGATIVE

## 2019-07-30 MED ORDER — SODIUM CHLORIDE 0.9 % IV SOLN: INTRAVENOUS | Status: DC

## 2019-07-30 MED ORDER — LABETALOL HCL 300 MG PO TABS
300.0000 mg | ORAL_TABLET | Freq: Two times a day (BID) | ORAL | Status: DC
  Administered 2019-07-30 – 2019-08-11 (×22): 300 mg via ORAL
  Filled 2019-07-30 (×27): qty 1

## 2019-07-30 MED ORDER — CHLORHEXIDINE GLUCONATE 4 % EX LIQD
60.0000 mL | Freq: Once | CUTANEOUS | Status: AC
  Administered 2019-07-31: 4 via TOPICAL

## 2019-07-30 MED ORDER — LOSARTAN POTASSIUM 50 MG PO TABS
100.0000 mg | ORAL_TABLET | Freq: Every day | ORAL | Status: DC
  Administered 2019-08-01 – 2019-08-11 (×10): 100 mg via ORAL
  Filled 2019-07-30 (×10): qty 2

## 2019-07-30 MED ORDER — OXYCODONE HCL 5 MG PO TABS
5.0000 mg | ORAL_TABLET | ORAL | Status: DC | PRN
  Administered 2019-07-31 – 2019-08-02 (×7): 10 mg via ORAL
  Administered 2019-08-03: 5 mg via ORAL
  Administered 2019-08-03 – 2019-08-06 (×9): 10 mg via ORAL
  Administered 2019-08-06: 5 mg via ORAL
  Administered 2019-08-06 – 2019-08-11 (×15): 10 mg via ORAL
  Filled 2019-07-30 (×4): qty 2
  Filled 2019-07-30: qty 1
  Filled 2019-07-30: qty 2
  Filled 2019-07-30: qty 1
  Filled 2019-07-30 (×26): qty 2

## 2019-07-30 MED ORDER — ACETAMINOPHEN 325 MG PO TABS
650.0000 mg | ORAL_TABLET | ORAL | Status: DC | PRN
  Administered 2019-08-01 – 2019-08-04 (×3): 650 mg via ORAL
  Filled 2019-07-30 (×4): qty 2

## 2019-07-30 MED ORDER — TRANEXAMIC ACID-NACL 1000-0.7 MG/100ML-% IV SOLN
1000.0000 mg | INTRAVENOUS | Status: AC
  Administered 2019-07-31: 1000 mg via INTRAVENOUS
  Filled 2019-07-30: qty 100

## 2019-07-30 MED ORDER — LIDOCAINE HCL (PF) 1 % IJ SOLN
INTRAMUSCULAR | Status: AC
  Administered 2019-07-30: 5 mL via INTRADERMAL
  Filled 2019-07-30: qty 30

## 2019-07-30 MED ORDER — ENSURE PRE-SURGERY PO LIQD
296.0000 mL | Freq: Once | ORAL | Status: DC
  Filled 2019-07-30: qty 296

## 2019-07-30 MED ORDER — SORBITOL 70 % SOLN
30.0000 mL | Freq: Every day | Status: DC | PRN
  Administered 2019-08-06: 19:00:00 30 mL via ORAL
  Filled 2019-07-30 (×2): qty 30

## 2019-07-30 MED ORDER — POVIDONE-IODINE 10 % EX SWAB: 2.0000 "application " | Freq: Once | CUTANEOUS | Status: DC

## 2019-07-30 MED ORDER — DOCUSATE SODIUM 100 MG PO CAPS
100.0000 mg | ORAL_CAPSULE | Freq: Two times a day (BID) | ORAL | Status: DC
  Administered 2019-07-31 – 2019-08-11 (×21): 100 mg via ORAL
  Filled 2019-07-30 (×22): qty 1

## 2019-07-30 MED ORDER — HYDROMORPHONE HCL 1 MG/ML IJ SOLN
0.5000 mg | INTRAMUSCULAR | Status: DC | PRN
  Administered 2019-07-31 – 2019-08-05 (×3): 0.5 mg via INTRAVENOUS
  Filled 2019-07-30 (×3): qty 1

## 2019-07-30 MED ORDER — HYDROMORPHONE HCL 1 MG/ML IJ SOLN
1.0000 mg | Freq: Once | INTRAMUSCULAR | Status: AC
  Administered 2019-07-30: 1 mg via INTRAMUSCULAR
  Filled 2019-07-30: qty 1

## 2019-07-30 MED ORDER — FLEET ENEMA 7-19 GM/118ML RE ENEM: 1.0000 | ENEMA | Freq: Once | RECTAL | Status: DC | PRN

## 2019-07-30 MED ORDER — SENNOSIDES-DOCUSATE SODIUM 8.6-50 MG PO TABS
1.0000 | ORAL_TABLET | Freq: Every evening | ORAL | Status: DC | PRN
  Administered 2019-08-06: 1 via ORAL
  Filled 2019-07-30: qty 1

## 2019-07-30 MED ORDER — CHLORHEXIDINE GLUCONATE 4 % EX LIQD: 60.0000 mL | Freq: Once | CUTANEOUS | Status: DC

## 2019-07-30 MED ORDER — LIDOCAINE HCL 1 % IJ SOLN
30.0000 mL | Freq: Once | INTRAMUSCULAR | Status: AC
  Filled 2019-07-30: qty 30

## 2019-07-30 NOTE — ED Notes (Signed)
[phone] daughter call for updates

## 2019-07-30 NOTE — ED Triage Notes (Signed)
C/o R knee pain s/p total knee arthroplasty 12/4.  Pt seen by plastic surgery specialists on 1/28 for wound dehiscence.  Pt states he is unable to bear weight on R leg.

## 2019-07-30 NOTE — H&P (Signed)
Devin [name] is an 84 y.o. male.   Chief Complaint: Right knee pain, wound dehisence s/p TKA HPI: Patient is s/p RTK 06/03/2019 with Dr. French Ana, he developed some skin issues acutely following surgery within the first week which in tern led to some wound necrosis and dehisence since.  He was initially treated with some po doxycycline but not currently on abx.  Since surgery postop period also complicated by hospital admission closer to his home in [place] for positive covid and pneumonia.  Family member states he has also been seen for dehydration.  They were consulted by plastic surgery 2 days ago who mentioned possiblity of skin graft pending no deep infection.  Up to this point wound has not appeared infected despite the dehisence which has seemed to progress over the last month.  We did do a knee aspiration on 06/10/19 which showed total cell count of 12,600, negative culture, no crystals.  Denies fevers, chills, sweats, they do note some wound drainage which has been persistent but not increasing.  Family member called ortho on call this AM mentioning increased right knee pain, inability to bear weight, not eating, instructed to come to [facility] for admission under Dr. French Ana and likely plan for surgery.    Some concerns with possible fall but having left UE/neck neurologic symptoms evaluated by ER physician   Past Medical History:  Diagnosis Date  . Hypertension   . Hypertension   OSA HLD Positive covid 05/2019 with pneumonia   Past Surgical History:  Procedure Laterality Date  . CLOSED REDUCTION FEMORAL SHAFT FRACTURE Left   . TOTAL KNEE ARTHROPLASTY Right 06/03/2019   Procedure: TOTAL KNEE ARTHROPLASTY, ORIF MEDIAL CONDYLE, LATERAL RELEASE;  Surgeon: Earlie Server, MD;  Location: [facility];  Service: Orthopedics;  Laterality: Right;    Family History  Problem Relation Age of Onset  . Stroke Mother    Social History:  reports that he has never smoked. He has never used smokeless  tobacco. He reports that he does not drink alcohol or use drugs.  Allergies: No Known Allergies  (Not in a hospital admission)   Results for orders placed or performed during the hospital encounter of 07/30/19 (from the past 48 hour(s))  CBC with Differential/Platelet     Status: Abnormal   Collection Time: 07/30/19 11:40 AM  Result Value Ref Range   WBC 7.6 4.0 - 10.5 K/uL   RBC 3.95 (L) 4.22 - 5.81 MIL/uL   Hemoglobin 12.0 (L) 13.0 - 17.0 g/dL   HCT 36.9 (L) 39.0 - 52.0 %   MCV 93.4 80.0 - 100.0 fL   MCH 30.4 26.0 - 34.0 pg   MCHC 32.5 30.0 - 36.0 g/dL   RDW 13.6 11.5 - 15.5 %   Platelets 242 150 - 400 K/uL   nRBC 0.0 0.0 - 0.2 %   Neutrophils Relative % 72 %   Neutro Abs 5.5 1.7 - 7.7 K/uL   Lymphocytes Relative 15 %   Lymphs Abs 1.1 0.7 - 4.0 K/uL   Monocytes Relative 12 %   Monocytes Absolute 0.9 0.1 - 1.0 K/uL   Eosinophils Relative 1 %   Eosinophils Absolute 0.1 0.0 - 0.5 K/uL   Basophils Relative 0 %   Basophils Absolute 0.0 0.0 - 0.1 K/uL   Immature Granulocytes 0 %   Abs Immature Granulocytes 0.03 0.00 - 0.07 K/uL    Comment: Performed at [facility], [address] [place], [place] [address]  Basic metabolic panel  Status: Abnormal   Collection Time: 07/30/19 11:40 AM  Result Value Ref Range   Sodium 133 (L) 135 - 145 mmol/L   Potassium 3.4 (L) 3.5 - 5.1 mmol/L   Chloride 99 98 - 111 mmol/L   CO2 24 22 - 32 mmol/L   Glucose, Bld 129 (H) 70 - 99 mg/dL   BUN 17 8 - 23 mg/dL   Creatinine, Ser 2.09 0.61 - 1.24 mg/dL   Calcium 9.1 8.9 - 47.0 mg/dL   GFR calc non Af Amer 54 (L) >60 mL/min   GFR calc Af Amer >60 >60 mL/min   Anion gap 10 5 - 15    Comment: Performed at [facility], [address] [place], [place] [address]   DG Knee 1-2 Views Right  Result Date: 07/30/2019 CLINICAL DATA:  Knee replacement a few months ago. Incision has never healed properly. Pain. EXAM: RIGHT KNEE - 1-2 VIEW COMPARISON:  None. FINDINGS: The patient is status  post left knee replacement. Femoral and tibial components are in good position with no evidence of loosening or failure. Two screws extend through the distal femur. No joint effusion. A skin defect is seen anteriorly on the lateral view, likely the un healed incision. No definite bony erosion. IMPRESSION: 1. An apparent skin defect is seen anteriorly on the lateral view, consistent with the unhealed incision. No definitive erosion of the underlying patella. Surgical hardware is in good position. Electronically Signed   By: Gerome Sam III M.D   On: 07/30/2019 14:53   CT Head Wo Contrast  Result Date: 07/30/2019 CLINICAL DATA:  Fall today with head and neck pain.  Ataxia. EXAM: CT HEAD WITHOUT CONTRAST CT CERVICAL SPINE WITHOUT CONTRAST TECHNIQUE: Multidetector CT imaging of the head and cervical spine was performed following the standard protocol without intravenous contrast. Multiplanar CT image reconstructions of the cervical spine were also generated. COMPARISON:  Head CT 06/24/2019 FINDINGS: CT HEAD FINDINGS Brain: Ventricles and cisterns are within normal. There is mild stable prominence of the CSF spaces compatible with atrophic change. Moderate chronic ischemic microvascular disease is present. Small old lacunar infarct over the left lentiform nucleus. No evidence of mass, mass effect, shift of midline structures or acute hemorrhage. No evidence of acute infarction. Vascular: No hyperdense vessel or unexpected calcification. Skull: Normal. Negative for fracture or focal lesion. Sinuses/Orbits: Orbits are normal. Hypoplastic frontal sinuses. Opacification over the sphenoid sinus slightly worse. Mastoid air cells are clear. Other: None. CT CERVICAL SPINE FINDINGS Alignment: Very subtle anterior subluxation of C5 and C6 likely degenerative in nature. No traumatic subluxation. Skull base and vertebrae: There is moderate spondylosis throughout the cervical spine. Vertebral body heights are normal. There is  smooth sclerotic bordered lucency through the base of the dens separating the dens from the remainder of the C2 vertebral body compatible with an old fracture. There is uncovertebral joint spurring and moderate facet arthropathy. There is significant neural foraminal narrowing at multiple levels worse along the right side at the C3-4 and C4-5 levels. Soft tissues and spinal canal: Prevertebral soft tissues are normal. Spinal canal is unremarkable. Disc levels:  No significant disc space narrowing. Upper chest: No acute findings. Other: None. IMPRESSION: 1.  No acute brain injury. 2. Chronic ischemic microvascular disease and age related atrophic change. 3. No acute cervical spine injury. 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 4. Moderate spondylosis throughout the cervical spine with moderate bilateral neural foraminal narrowing at multiple levels as described. 5. Chronic inflammatory change of  the sphenoid sinus slightly worse. Electronically Signed   By: Elberta Fortis M.D.   On: 07/30/2019 13:40   CT Cervical Spine Wo Contrast  Result Date: 07/30/2019 CLINICAL DATA:  Fall today with head and neck pain.  Ataxia. EXAM: CT HEAD WITHOUT CONTRAST CT CERVICAL SPINE WITHOUT CONTRAST TECHNIQUE: Multidetector CT imaging of the head and cervical spine was performed following the standard protocol without intravenous contrast. Multiplanar CT image reconstructions of the cervical spine were also generated. COMPARISON:  Head CT 06/24/2019 FINDINGS: CT HEAD FINDINGS Brain: Ventricles and cisterns are within normal. There is mild stable prominence of the CSF spaces compatible with atrophic change. Moderate chronic ischemic microvascular disease is present. Small old lacunar infarct over the left lentiform nucleus. No evidence of mass, mass effect, shift of midline structures or acute hemorrhage. No evidence of acute infarction. Vascular: No hyperdense vessel or unexpected calcification. Skull: Normal. Negative for fracture or  focal lesion. Sinuses/Orbits: Orbits are normal. Hypoplastic frontal sinuses. Opacification over the sphenoid sinus slightly worse. Mastoid air cells are clear. Other: None. CT CERVICAL SPINE FINDINGS Alignment: Very subtle anterior subluxation of C5 and C6 likely degenerative in nature. No traumatic subluxation. Skull base and vertebrae: There is moderate spondylosis throughout the cervical spine. Vertebral body heights are normal. There is smooth sclerotic bordered lucency through the base of the dens separating the dens from the remainder of the C2 vertebral body compatible with an old fracture. There is uncovertebral joint spurring and moderate facet arthropathy. There is significant neural foraminal narrowing at multiple levels worse along the right side at the C3-4 and C4-5 levels. Soft tissues and spinal canal: Prevertebral soft tissues are normal. Spinal canal is unremarkable. Disc levels:  No significant disc space narrowing. Upper chest: No acute findings. Other: None. IMPRESSION: 1.  No acute brain injury. 2. Chronic ischemic microvascular disease and age related atrophic change. 3. No acute cervical spine injury. 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 4. Moderate spondylosis throughout the cervical spine with moderate bilateral neural foraminal narrowing at multiple levels as described. 5. 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 Electronically Signed   By: Elberta Fortis M.D.   On: 07/30/2019 13:40    Review of Systems  Constitutional: Positive for activity change, appetite change and fatigue. Negative for chills and fever.  Musculoskeletal: Positive for gait problem and joint swelling.  All other systems reviewed and are negative.   Blood pressure 137/84, pulse 81, temperature 98 F (36.7 C), temperature source Oral, resp. rate 20, SpO2 96 %. Physical Exam  Constitutional: He is oriented to person, place, and time. He appears well-developed and well-nourished. No  distress.  HENT:  Head: Normocephalic and atraumatic.  Eyes: Pupils are equal, round, and reactive to light. Conjunctivae and EOM are normal.  Cardiovascular: Normal rate and intact distal pulses.  Respiratory: Effort normal. No respiratory distress.  GI: Soft. He exhibits no distension. There is no abdominal tenderness.  Musculoskeletal:     Cervical back: Normal range of motion and neck supple.     Comments: Right knee good ROM, positive effusion which was aspirated of 31ml bloody murky fluid and sent to lab.    Neurological: He is alert and oriented to person, place, and time.  Skin: Skin is warm and dry.  Right knee wound dehisence with black necrotic tissue over patellar tendon which is clearly visible.  Minimal drainage or surrounding erythema.  Psychiatric: He has a normal mood and affect. His behavior is normal.  Assessment/Plan Right knee s/p TKA with wound dehisence concern for joint infection  Pending head and neck clearance recommend admission to ortho service for possible surgery tomorrow.  Plan wound be for I&D Right knee, possible polly exchange, possible removal of total knee components with placement of antibiotic spacer.  We will make him NPO after midnight.  Pain control.  Hold any IV abx unless indicated by gram stain taken in ED.    Margart Sickles, PA-C 07/30/2019, 3:33 PM

## 2019-07-30 NOTE — ED Provider Notes (Signed)
[facility] EMERGENCY DEPARTMENT Provider Note   CSN: [id number] Arrival date & time: 07/30/19  1046     History Chief Complaint  Patient presents with  . Knee Pain    Devin [name] is a 84 y.o. male.  Patient had knee replacement a couple months ago.  His incision never healed properly.  He continues to have pain with that knee and he also complains of some numbness in his left arm that has been going on for weeks since he fell.  The history is provided by the patient. No language interpreter was used.  Knee Pain Location:  Knee Injury: yes   Mechanism of injury comment:  Knee replacement Knee location:  R knee Pain details:    Quality:  Aching   Radiates to:  Does not radiate   Severity:  Moderate   Onset quality:  Gradual   Timing:  Constant Associated symptoms: no back pain and no fatigue        Past Medical History:  Diagnosis Date  . Hypertension   . Hypertension     Patient Active Problem List   Diagnosis Date Noted  . Primary localized osteoarthritis of right knee 06/03/2019    Past Surgical History:  Procedure Laterality Date  . CLOSED REDUCTION FEMORAL SHAFT FRACTURE Left   . TOTAL KNEE ARTHROPLASTY Right 06/03/2019   Procedure: TOTAL KNEE ARTHROPLASTY, ORIF MEDIAL CONDYLE, LATERAL RELEASE;  Surgeon: Frederico Hamman, MD;  Location: [facility];  Service: Orthopedics;  Laterality: Right;       Family History  Problem Relation Age of Onset  . Stroke Mother     Social History   Tobacco Use  . Smoking status: Never Smoker  . Smokeless tobacco: Never Used  Substance Use Topics  . Alcohol use: Never  . Drug use: Never    Home Medications Prior to Admission medications   Medication Sig Start Date End Date Taking? Authorizing Provider  chlorthalidone (HYGROTON) 25 MG tablet Take 25 mg by mouth daily. 03/22/19  Yes [provider]  [employer] D3 25 MCG (1000 UT) capsule Take 1,000 Units by mouth daily. 06/29/19  Yes  [provider]  labetalol (NORMODYNE) 300 MG tablet Take 300 mg by mouth 2 (two) times daily.   Yes [provider]  losartan (COZAAR) 100 MG tablet Take 100 mg by mouth daily. 07/11/19  Yes [provider]  acetaminophen (TYLENOL) 325 MG tablet Take 2 tablets (650 mg total) by mouth every 4 (four) hours as needed. Patient not taking: Reported on 07/28/2019 06/03/19 06/02/20  Margart Sickles, PA-C    Allergies    Patient has no known allergies.  Review of Systems   Review of Systems  Constitutional: Negative for appetite change and fatigue.  HENT: Negative for congestion, ear discharge and sinus pressure.   Eyes: Negative for discharge.  Respiratory: Negative for cough.   Cardiovascular: Negative for chest pain.  Gastrointestinal: Negative for abdominal pain and diarrhea.  Genitourinary: Negative for frequency and hematuria.  Musculoskeletal: Negative for back pain.       Knee pain and numbness left arm  Skin: Negative for rash.  Neurological: Negative for seizures and headaches.  Psychiatric/Behavioral: Negative for hallucinations.    Physical Exam Updated Vital Signs BP 137/84   Pulse 81   Temp 98 F (36.7 C) (Oral)   Resp 20   SpO2 96%   Physical Exam Vitals and nursing note reviewed.  Constitutional:      Appearance: He is well-developed.  HENT:     Head: Normocephalic.     Nose: Nose normal.  Eyes:     General: No scleral icterus.    Conjunctiva/sclera: Conjunctivae normal.  Neck:     Thyroid: No thyromegaly.  Cardiovascular:     Rate and Rhythm: Normal rate and regular rhythm.     Heart sounds: No murmur. No friction rub. No gallop.   Pulmonary:     Breath sounds: No stridor. No wheezing or rales.  Chest:     Chest wall: No tenderness.  Abdominal:     General: There is no distension.     Tenderness: There is no abdominal tenderness. There is no rebound.  Musculoskeletal:        General: Normal range of motion.     Cervical  back: Neck supple.     Comments: Right knee has an open wound from his knee replacement.  Patient also has inflammation around that and tenderness  Lymphadenopathy:     Cervical: No cervical adenopathy.  Skin:    Findings: No erythema or rash.  Neurological:     Mental Status: He is alert and oriented to person, place, and time.     Motor: No abnormal muscle tone.     Coordination: Coordination normal.  Psychiatric:        Behavior: Behavior normal.     ED Results / Procedures / Treatments   Labs (all labs ordered are listed, but only abnormal results are displayed) Labs Reviewed  CBC WITH DIFFERENTIAL/PLATELET - Abnormal; Notable for the following components:      Result Value   RBC 3.95 (*)    Hemoglobin 12.0 (*)    HCT 36.9 (*)    All other components within normal limits  BASIC METABOLIC PANEL - Abnormal; Notable for the following components:   Sodium 133 (*)    Potassium 3.4 (*)    Glucose, Bld 129 (*)    GFR calc non Af Amer 54 (*)    All other components within normal limits  AEROBIC CULTURE (SUPERFICIAL SPECIMEN)  GRAM STAIN  BODY FLUID CULTURE  BODY FLUID CELL COUNT WITH DIFFERENTIAL    EKG None  Radiology CT Head Wo Contrast  Result Date: 07/30/2019 CLINICAL DATA:  Fall today with head and neck pain.  Ataxia. EXAM: CT HEAD WITHOUT CONTRAST CT CERVICAL SPINE WITHOUT CONTRAST TECHNIQUE: Multidetector CT imaging of the head and cervical spine was performed following the standard protocol without intravenous contrast. Multiplanar CT image reconstructions of the cervical spine were also generated. COMPARISON:  Head CT 06/24/2019 FINDINGS: CT HEAD FINDINGS Brain: Ventricles and cisterns are within normal. There is mild stable prominence of the CSF spaces compatible with atrophic change. Moderate chronic ischemic microvascular disease is present. Small old lacunar infarct over the left lentiform nucleus. No evidence of mass, mass effect, shift of midline structures or  acute hemorrhage. No evidence of acute infarction. Vascular: No hyperdense vessel or unexpected calcification. Skull: Normal. Negative for fracture or focal lesion. Sinuses/Orbits: Orbits are normal. Hypoplastic frontal sinuses. Opacification over the sphenoid sinus slightly worse. Mastoid air cells are clear. Other: None. CT CERVICAL SPINE FINDINGS Alignment: Very subtle anterior subluxation of C5 and C6 likely degenerative in nature. No traumatic subluxation. Skull base and vertebrae: There is moderate spondylosis throughout the cervical spine. Vertebral body heights are normal. There is smooth sclerotic bordered lucency through the base of the dens separating the dens from the remainder of the C2 vertebral body compatible with an old fracture. There is  uncovertebral joint spurring and moderate facet arthropathy. There is significant neural foraminal narrowing at multiple levels worse along the right side at the C3-4 and C4-5 levels. Soft tissues and spinal canal: Prevertebral soft tissues are normal. Spinal canal is unremarkable. Disc levels:  No significant disc space narrowing. Upper chest: No acute findings. Other: None. IMPRESSION: 1.  No acute brain injury. 2. Chronic ischemic microvascular disease and age related atrophic change. 3. No acute cervical spine injury. 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 4. Moderate spondylosis throughout the cervical spine with moderate bilateral neural foraminal narrowing at multiple levels as described. 5. 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 Electronically Signed   By: Elberta Fortis M.D.   On: 07/30/2019 13:40   CT Cervical Spine Wo Contrast  Result Date: 07/30/2019 CLINICAL DATA:  Fall today with head and neck pain.  Ataxia. EXAM: CT HEAD WITHOUT CONTRAST CT CERVICAL SPINE WITHOUT CONTRAST TECHNIQUE: Multidetector CT imaging of the head and cervical spine was performed following the standard protocol without intravenous contrast. Multiplanar  CT image reconstructions of the cervical spine were also generated. COMPARISON:  Head CT 06/24/2019 FINDINGS: CT HEAD FINDINGS Brain: Ventricles and cisterns are within normal. There is mild stable prominence of the CSF spaces compatible with atrophic change. Moderate chronic ischemic microvascular disease is present. Small old lacunar infarct over the left lentiform nucleus. No evidence of mass, mass effect, shift of midline structures or acute hemorrhage. No evidence of acute infarction. Vascular: No hyperdense vessel or unexpected calcification. Skull: Normal. Negative for fracture or focal lesion. Sinuses/Orbits: Orbits are normal. Hypoplastic frontal sinuses. Opacification over the sphenoid sinus slightly worse. Mastoid air cells are clear. Other: None. CT CERVICAL SPINE FINDINGS Alignment: Very subtle anterior subluxation of C5 and C6 likely degenerative in nature. No traumatic subluxation. Skull base and vertebrae: There is moderate spondylosis throughout the cervical spine. Vertebral body heights are normal. There is smooth sclerotic bordered lucency through the base of the dens separating the dens from the remainder of the C2 vertebral body compatible with an old fracture. There is uncovertebral joint spurring and moderate facet arthropathy. There is significant neural foraminal narrowing at multiple levels worse along the right side at the C3-4 and C4-5 levels. Soft tissues and spinal canal: Prevertebral soft tissues are normal. Spinal canal is unremarkable. Disc levels:  No significant disc space narrowing. Upper chest: No acute findings. Other: None. IMPRESSION: 1.  No acute brain injury. 2. Chronic ischemic microvascular disease and age related atrophic change. 3. No acute cervical spine injury. 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 4. Moderate spondylosis throughout the cervical spine with moderate bilateral neural foraminal narrowing at multiple levels as described. 5. Chronic inflammatory change of the  sphenoid sinus slightly worse. Electronically Signed   By: Elberta Fortis M.D.   On: 07/30/2019 13:40    Procedures Procedures (including critical care time)  Medications Ordered in ED Medications  lidocaine (PF) (XYLOCAINE) 1 % injection (has no administration in time range)  lidocaine (XYLOCAINE) 1 % (with pres) injection 30 mL (has no administration in time range)  HYDROmorphone (DILAUDID) injection 1 mg (1 mg Intramuscular Given 07/30/19 1130)    ED Course  I have reviewed the triage vital signs and the nursing notes.  Pertinent labs & imaging results that were available during my care of the patient were reviewed by me and considered in my medical decision making (see chart for details).    MDM Rules/Calculators/A&P  Patient with a dehisced wound on the right knee.  Patient was seen by orthopedics and they feel he needs surgery to repair this wound.  He will be admitted.  Patient also had CT head cervical spine does show some degenerative changes in his cervical spine Final Clinical Impression(s) / ED Diagnoses Final diagnoses:  Acute pain of right knee    Rx / DC Orders ED Discharge Orders    None       Bethann Berkshire, MD 07/30/19 1449

## 2019-07-30 NOTE — ED Notes (Signed)
Called report, nurse unavailable to take report at this time. Will call back shortly

## 2019-07-30 NOTE — ED Notes (Signed)
Report given to 5n 

## 2019-07-31 ENCOUNTER — Encounter (HOSPITAL_COMMUNITY)
Admission: EM | Disposition: A | Discharge status: Home Health | Payer: Self-pay | Source: Home / Self Care | Attending: Orthopedic Surgery

## 2019-07-31 ENCOUNTER — Inpatient Hospital Stay (HOSPITAL_COMMUNITY): Payer: Medicare Other | Admitting: Certified Registered"

## 2019-07-31 ENCOUNTER — Encounter (HOSPITAL_COMMUNITY): Payer: Self-pay | Admitting: Orthopedic Surgery

## 2019-07-31 DIAGNOSIS — M25561 Pain in right knee: Secondary | ICD-10-CM

## 2019-07-31 DIAGNOSIS — A498 Other bacterial infections of unspecified site: Secondary | ICD-10-CM

## 2019-07-31 DIAGNOSIS — Z96651 Presence of right artificial knee joint: Secondary | ICD-10-CM

## 2019-07-31 DIAGNOSIS — S86901A Unspecified injury of unspecified muscle(s) and tendon(s) at lower leg level, right leg, initial encounter: Secondary | ICD-10-CM

## 2019-07-31 DIAGNOSIS — S81001A Unspecified open wound, right knee, initial encounter: Secondary | ICD-10-CM

## 2019-07-31 DIAGNOSIS — T8130XA Disruption of wound, unspecified, initial encounter: Secondary | ICD-10-CM

## 2019-07-31 DIAGNOSIS — T8453XA Infection and inflammatory reaction due to internal right knee prosthesis, initial encounter: Principal | ICD-10-CM

## 2019-07-31 HISTORY — PX: APPLICATION OF WOUND VAC: SHX5189

## 2019-07-31 HISTORY — PX: TOTAL KNEE REVISION: SHX996

## 2019-07-31 LAB — MRSA PCR SCREENING: MRSA by PCR: NEGATIVE

## 2019-07-31 SURGERY — TOTAL KNEE REVISION
Anesthesia: General | Site: Knee | Laterality: Right

## 2019-07-31 MED ORDER — VANCOMYCIN HCL 1500 MG/300ML IV SOLN
1500.0000 mg | INTRAVENOUS | Status: AC
  Administered 2019-07-31: 1500 mg via INTRAVENOUS
  Filled 2019-07-31: qty 300

## 2019-07-31 MED ORDER — SODIUM CHLORIDE 0.9 % IR SOLN
Status: DC | PRN
  Administered 2019-07-31: 1000 mL

## 2019-07-31 MED ORDER — METOCLOPRAMIDE HCL 5 MG PO TABS: 5.0000 mg | ORAL_TABLET | Freq: Three times a day (TID) | ORAL | Status: DC | PRN

## 2019-07-31 MED ORDER — SODIUM CHLORIDE 0.9 % IV SOLN
2.0000 g | INTRAVENOUS | Status: DC
  Filled 2019-07-31: qty 20

## 2019-07-31 MED ORDER — TRANEXAMIC ACID-NACL 1000-0.7 MG/100ML-% IV SOLN
1000.0000 mg | Freq: Once | INTRAVENOUS | Status: AC
  Administered 2019-07-31: 1000 mg via INTRAVENOUS
  Filled 2019-07-31: qty 100

## 2019-07-31 MED ORDER — METOCLOPRAMIDE HCL 5 MG/ML IJ SOLN: 5.0000 mg | Freq: Three times a day (TID) | INTRAMUSCULAR | Status: DC | PRN

## 2019-07-31 MED ORDER — PHENOL 1.4 % MT LIQD: 1.0000 | OROMUCOSAL | Status: DC | PRN

## 2019-07-31 MED ORDER — SODIUM CHLORIDE 0.9 % IV SOLN: INTRAVENOUS | Status: DC

## 2019-07-31 MED ORDER — FENTANYL CITRATE (PF) 100 MCG/2ML IJ SOLN: 25.0000 ug | INTRAMUSCULAR | Status: DC | PRN

## 2019-07-31 MED ORDER — ROCURONIUM BROMIDE 50 MG/5ML IV SOSY
PREFILLED_SYRINGE | INTRAVENOUS | Status: DC | PRN
  Administered 2019-07-31: 60 mg via INTRAVENOUS

## 2019-07-31 MED ORDER — ONDANSETRON HCL 4 MG/2ML IJ SOLN: 4.0000 mg | Freq: Four times a day (QID) | INTRAMUSCULAR | Status: DC | PRN

## 2019-07-31 MED ORDER — PHENYLEPHRINE 40 MCG/ML (10ML) SYRINGE FOR IV PUSH (FOR BLOOD PRESSURE SUPPORT)
PREFILLED_SYRINGE | INTRAVENOUS | Status: DC | PRN
  Administered 2019-07-31 (×5): 80 ug via INTRAVENOUS

## 2019-07-31 MED ORDER — ASPIRIN 81 MG PO CHEW
81.0000 mg | CHEWABLE_TABLET | Freq: Two times a day (BID) | ORAL | Status: DC
  Administered 2019-07-31 – 2019-08-11 (×21): 81 mg via ORAL
  Filled 2019-07-31 (×21): qty 1

## 2019-07-31 MED ORDER — DEXAMETHASONE SODIUM PHOSPHATE 10 MG/ML IJ SOLN
INTRAMUSCULAR | Status: AC
  Filled 2019-07-31: qty 1

## 2019-07-31 MED ORDER — ROCURONIUM BROMIDE 10 MG/ML (PF) SYRINGE
PREFILLED_SYRINGE | INTRAVENOUS | Status: AC
  Filled 2019-07-31: qty 10

## 2019-07-31 MED ORDER — LIDOCAINE 2% (20 MG/ML) 5 ML SYRINGE
INTRAMUSCULAR | Status: AC
  Filled 2019-07-31: qty 5

## 2019-07-31 MED ORDER — PHENYLEPHRINE HCL-NACL 10-0.9 MG/250ML-% IV SOLN
INTRAVENOUS | Status: DC | PRN
  Administered 2019-07-31: 60 ug/min via INTRAVENOUS

## 2019-07-31 MED ORDER — GENTAMICIN SULFATE 40 MG/ML IJ SOLN
INTRAMUSCULAR | Status: AC
  Filled 2019-07-31: qty 6

## 2019-07-31 MED ORDER — SODIUM CHLORIDE 0.9 % IV SOLN
2.0000 g | Freq: Two times a day (BID) | INTRAVENOUS | Status: DC
  Administered 2019-07-31 – 2019-08-05 (×10): 2 g via INTRAVENOUS
  Filled 2019-07-31 (×10): qty 2

## 2019-07-31 MED ORDER — SODIUM CHLORIDE 0.9 % IR SOLN
Status: DC | PRN
  Administered 2019-07-31: 3000 mL

## 2019-07-31 MED ORDER — PROPOFOL 10 MG/ML IV BOLUS
INTRAVENOUS | Status: DC | PRN
  Administered 2019-07-31: 80 mg via INTRAVENOUS

## 2019-07-31 MED ORDER — DEXAMETHASONE SODIUM PHOSPHATE 10 MG/ML IJ SOLN
INTRAMUSCULAR | Status: DC | PRN
  Administered 2019-07-31: 6 mg via INTRAVENOUS

## 2019-07-31 MED ORDER — PHENYLEPHRINE 40 MCG/ML (10ML) SYRINGE FOR IV PUSH (FOR BLOOD PRESSURE SUPPORT)
PREFILLED_SYRINGE | INTRAVENOUS | Status: AC
  Filled 2019-07-31: qty 10

## 2019-07-31 MED ORDER — SUGAMMADEX SODIUM 200 MG/2ML IV SOLN
INTRAVENOUS | Status: DC | PRN
  Administered 2019-07-31: 200 mg via INTRAVENOUS

## 2019-07-31 MED ORDER — LIDOCAINE 2% (20 MG/ML) 5 ML SYRINGE
INTRAMUSCULAR | Status: DC | PRN
  Administered 2019-07-31: 40 mg via INTRAVENOUS

## 2019-07-31 MED ORDER — SODIUM CHLORIDE 0.9 % IR SOLN
Status: DC | PRN
  Administered 2019-07-31: 250 mL

## 2019-07-31 MED ORDER — FENTANYL CITRATE (PF) 250 MCG/5ML IJ SOLN
INTRAMUSCULAR | Status: AC
  Filled 2019-07-31: qty 5

## 2019-07-31 MED ORDER — PROPOFOL 10 MG/ML IV BOLUS
INTRAVENOUS | Status: AC
  Filled 2019-07-31: qty 20

## 2019-07-31 MED ORDER — FENTANYL CITRATE (PF) 100 MCG/2ML IJ SOLN
INTRAMUSCULAR | Status: DC | PRN
  Administered 2019-07-31 (×2): 25 ug via INTRAVENOUS
  Administered 2019-07-31: 100 ug via INTRAVENOUS

## 2019-07-31 MED ORDER — LACTATED RINGERS IV SOLN: INTRAVENOUS | Status: DC | PRN

## 2019-07-31 MED ORDER — DOCUSATE SODIUM 100 MG PO CAPS: 100.0000 mg | ORAL_CAPSULE | Freq: Two times a day (BID) | ORAL | Status: DC

## 2019-07-31 MED ORDER — VANCOMYCIN HCL 1000 MG IV SOLR
INTRAVENOUS | Status: AC
  Filled 2019-07-31: qty 1000

## 2019-07-31 MED ORDER — ONDANSETRON HCL 4 MG PO TABS: 4.0000 mg | ORAL_TABLET | Freq: Four times a day (QID) | ORAL | Status: DC | PRN

## 2019-07-31 MED ORDER — TRANEXAMIC ACID-NACL 1000-0.7 MG/100ML-% IV SOLN
INTRAVENOUS | Status: AC
  Filled 2019-07-31: qty 100

## 2019-07-31 MED ORDER — GENTAMICIN SULFATE 40 MG/ML IJ SOLN
INTRAMUSCULAR | Status: DC | PRN
  Administered 2019-07-31: 240 mg

## 2019-07-31 MED ORDER — MENTHOL 3 MG MT LOZG: 1.0000 | LOZENGE | OROMUCOSAL | Status: DC | PRN

## 2019-07-31 MED ORDER — VANCOMYCIN HCL IN DEXTROSE 1-5 GM/200ML-% IV SOLN
1000.0000 mg | INTRAVENOUS | Status: DC
  Administered 2019-08-01: 10:00:00 1000 mg via INTRAVENOUS
  Filled 2019-07-31: qty 200

## 2019-07-31 MED ORDER — DIPHENHYDRAMINE HCL 12.5 MG/5ML PO ELIX
12.5000 mg | ORAL_SOLUTION | ORAL | Status: DC | PRN
  Administered 2019-08-04: 25 mg via ORAL
  Filled 2019-07-31: qty 10

## 2019-07-31 MED ORDER — ONDANSETRON HCL 4 MG/2ML IJ SOLN
INTRAMUSCULAR | Status: AC
  Filled 2019-07-31: qty 2

## 2019-07-31 MED ORDER — LACTATED RINGERS IV SOLN: INTRAVENOUS | Status: DC

## 2019-07-31 MED ORDER — ONDANSETRON HCL 4 MG/2ML IJ SOLN
INTRAMUSCULAR | Status: DC | PRN
  Administered 2019-07-31: 4 mg via INTRAVENOUS

## 2019-07-31 MED ORDER — VANCOMYCIN HCL 1000 MG IV SOLR
INTRAVENOUS | Status: DC | PRN
  Administered 2019-07-31: 1000 mg

## 2019-07-31 SURGICAL SUPPLY — 73 items
ATTUNE PSRP INSR SZ6 5 KNEE (Insert) ×2 IMPLANT
BANDAGE ESMARK 6X9 LF (GAUZE/BANDAGES/DRESSINGS) ×1 IMPLANT
BENZOIN TINCTURE PRP APPL 2/3 (GAUZE/BANDAGES/DRESSINGS) ×2 IMPLANT
BLADE SAGITTAL 25.0X1.19X90 (BLADE) ×2 IMPLANT
BLADE SAW SAG 90X13X1.27 (BLADE) ×2 IMPLANT
BNDG COHESIVE 6X5 TAN STRL LF (GAUZE/BANDAGES/DRESSINGS) ×2 IMPLANT
BNDG ELASTIC 4X5.8 VLCR STR LF (GAUZE/BANDAGES/DRESSINGS) ×2 IMPLANT
BNDG ELASTIC 6X5.8 VLCR STR LF (GAUZE/BANDAGES/DRESSINGS) ×2 IMPLANT
BNDG ESMARK 6X9 LF (GAUZE/BANDAGES/DRESSINGS) ×2
BOWL SMART MIX CTS (DISPOSABLE) ×2 IMPLANT
BRUSH SCRUB EZ PLAIN DRY (MISCELLANEOUS) ×4 IMPLANT
CANISTER WOUNDNEG PRESSURE 500 (CANNISTER) ×2 IMPLANT
CASSETTE VERAFLO VERALINK (MISCELLANEOUS) ×2 IMPLANT
COVER SURGICAL LIGHT HANDLE (MISCELLANEOUS) ×2 IMPLANT
COVER WAND RF STERILE (DRAPES) ×2 IMPLANT
CUFF TOURN SGL QUICK 34 (TOURNIQUET CUFF) ×1
CUFF TOURN SGL QUICK 42 (TOURNIQUET CUFF) IMPLANT
CUFF TRNQT CYL 34X4.125X (TOURNIQUET CUFF) ×1 IMPLANT
DRAPE INCISE IOBAN 66X45 STRL (DRAPES) IMPLANT
DRAPE ORTHO SPLIT 77X108 STRL (DRAPES) ×2
DRAPE SURG ORHT 6 SPLT 77X108 (DRAPES) ×2 IMPLANT
DRAPE U-SHAPE 47X51 STRL (DRAPES) ×2 IMPLANT
DRESSING VERAFLO CLEANSE CC (GAUZE/BANDAGES/DRESSINGS) ×1 IMPLANT
DRSG ADAPTIC 3X8 NADH LF (GAUZE/BANDAGES/DRESSINGS) ×2 IMPLANT
DRSG PAD ABDOMINAL 8X10 ST (GAUZE/BANDAGES/DRESSINGS) ×4 IMPLANT
DRSG VERAFLO CLEANSE CC (GAUZE/BANDAGES/DRESSINGS) ×2
DURAPREP 26ML APPLICATOR (WOUND CARE) ×2 IMPLANT
ELECT REM PT RETURN 9FT ADLT (ELECTROSURGICAL) ×2
ELECTRODE REM PT RTRN 9FT ADLT (ELECTROSURGICAL) ×1 IMPLANT
EVACUATOR 1/8 PVC DRAIN (DRAIN) IMPLANT
FACESHIELD WRAPAROUND (MASK) ×2 IMPLANT
GAUZE SPONGE 4X4 12PLY STRL (GAUZE/BANDAGES/DRESSINGS) ×2 IMPLANT
GLOVE BIOGEL PI IND STRL 8 (GLOVE) ×4 IMPLANT
GLOVE BIOGEL PI INDICATOR 8 (GLOVE) ×4
GLOVE ORTHO TXT STRL SZ7.5 (GLOVE) ×2 IMPLANT
GLOVE SURG ORTHO 8.0 STRL STRW (GLOVE) ×2 IMPLANT
GOWN STRL REUS W/ TWL LRG LVL3 (GOWN DISPOSABLE) ×1 IMPLANT
GOWN STRL REUS W/ TWL XL LVL3 (GOWN DISPOSABLE) ×1 IMPLANT
GOWN STRL REUS W/TWL 2XL LVL3 (GOWN DISPOSABLE) ×2 IMPLANT
GOWN STRL REUS W/TWL LRG LVL3 (GOWN DISPOSABLE) ×1
GOWN STRL REUS W/TWL XL LVL3 (GOWN DISPOSABLE) ×1
HANDPIECE INTERPULSE COAX TIP (DISPOSABLE) ×1
HOOD PEEL AWAY FACE SHEILD DIS (HOOD) ×2 IMPLANT
IMMOBILIZER KNEE 22 UNIV (SOFTGOODS) ×2 IMPLANT
KIT BASIN OR (CUSTOM PROCEDURE TRAY) ×2 IMPLANT
KIT STIMULAN RAPID CURE  10CC (Orthopedic Implant) ×1 IMPLANT
KIT STIMULAN RAPID CURE 10CC (Orthopedic Implant) ×1 IMPLANT
KIT TURNOVER KIT B (KITS) ×2 IMPLANT
MANIFOLD NEPTUNE II (INSTRUMENTS) ×2 IMPLANT
NEEDLE 22X1 1/2 (OR ONLY) (NEEDLE) ×4 IMPLANT
NS IRRIG 1000ML POUR BTL (IV SOLUTION) ×2 IMPLANT
PACK TOTAL JOINT (CUSTOM PROCEDURE TRAY) ×2 IMPLANT
PAD ARMBOARD 7.5X6 YLW CONV (MISCELLANEOUS) ×4 IMPLANT
PAD CAST 4YDX4 CTTN HI CHSV (CAST SUPPLIES) ×1 IMPLANT
PADDING CAST COTTON 4X4 STRL (CAST SUPPLIES) ×1
PADDING CAST COTTON 6X4 STRL (CAST SUPPLIES) ×2 IMPLANT
SET HNDPC FAN SPRY TIP SCT (DISPOSABLE) ×1 IMPLANT
STAPLER VISISTAT 35W (STAPLE) ×2 IMPLANT
SUCTION FRAZIER HANDLE 10FR (MISCELLANEOUS) ×1
SUCTION TUBE FRAZIER 10FR DISP (MISCELLANEOUS) ×1 IMPLANT
SUT ETHIBOND NAB CT1 #1 30IN (SUTURE) ×6 IMPLANT
SUT ETHILON 2 0 PSLX (SUTURE) ×2 IMPLANT
SUT PDS AB 1 CT  36 (SUTURE) ×2
SUT PDS AB 1 CT 36 (SUTURE) ×2 IMPLANT
SUT VIC AB 0 CT1 27 (SUTURE) ×1
SUT VIC AB 0 CT1 27XBRD ANBCTR (SUTURE) ×1 IMPLANT
SUT VIC AB 2-0 CT1 27 (SUTURE) ×2
SUT VIC AB 2-0 CT1 TAPERPNT 27 (SUTURE) ×2 IMPLANT
SYR CONTROL 10ML LL (SYRINGE) ×4 IMPLANT
TOWEL GREEN STERILE (TOWEL DISPOSABLE) ×2 IMPLANT
TRAY CATH 16FR W/PLASTIC CATH (SET/KITS/TRAYS/PACK) IMPLANT
TRAY FOLEY MTR SLVR 16FR STAT (SET/KITS/TRAYS/PACK) IMPLANT
WATER STERILE IRR 1000ML POUR (IV SOLUTION) ×2 IMPLANT

## 2019-07-31 NOTE — Plan of Care (Signed)

## 2019-07-31 NOTE — Interval H&P Note (Signed)
History and Physical Interval Note:  07/31/2019 7:27 AM  Devin [name]  has presented today for surgery, with the diagnosis of infected right total knee.  The various methods of treatment have been discussed with the patient and family. After consideration of risks, benefits and other options for treatment, the patient has consented to  Procedure(s): RIGHT KNEE IRRIGATION AND DEBRIDEMENT, POSSIBLE WOUND VAC PLACEMENT,POSSIBLE POLY EXCHANGE, POSSIBLE TOTAL KNEE REVISION (Right) as a surgical intervention.  The patient's history has been reviewed, patient examined, no change in status, stable for surgery.  I have reviewed the patient's chart and labs.  Questions were answered to the patient's satisfaction.     Thera Flake

## 2019-07-31 NOTE — Plan of Care (Signed)

## 2019-07-31 NOTE — Consult Note (Addendum)
[facility]  Total days of antibiotics 1               Reason for Consult:prosthetic joint infection    Referring Physician: caffrey  Active Problems:   Infection of total right knee replacement (HCC)    HPI: Devin [name] is a 84 y.o. male with hx of htn, oa who underwent right TKA on 12/4 who had developed erythema and swelling to his right knee, he underwent arthrocentesis evaluation which had no growth on 12/11. He was given a course of doxycycline to see if any improvement. He sustained tissue injury from what is suspected as prolonged icing on his knee and thus injury->devitalization of skin-> with wound breakdown and development of black eschar about incision, some of which has sloughed and showed exposed patellar tendon. He was been this past week by dr pace, from plastic surgery, who recommends to do muscle/skin flap repair. On 1/20, the patient had increasing right knee pain and instructed to come to ED for evaluation. Arthrocentesis now shows WBC of 29K with 93N, no organism on gram stain.he was taken to or this am on 1/31, to do I x D, abtx powder, poly exchange. Started on vanco and ceftriaxone.   Past Medical History:  Diagnosis Date  . Hypertension   . Hypertension     Allergies: No Known Allergies   MEDICATIONS: . aspirin  81 mg Oral BID  . docusate sodium  100 mg Oral BID  . labetalol  300 mg Oral BID  . losartan  100 mg Oral Daily    Social History   Tobacco Use  . Smoking status: Never Smoker  . Smokeless tobacco: Never Used  Substance Use Topics  . Alcohol use: Never  . Drug use: Never    Family History  Problem Relation Age of Onset  . Stroke Mother     ROS: Constitutional: Negative for fever, chills, diaphoresis, activity change, appetite change, fatigue and unexpected weight change.  HENT: Negative for congestion, sore throat, rhinorrhea, sneezing, trouble swallowing and sinus pressure.  Eyes: Negative for  photophobia and visual disturbance.  Respiratory: Negative for cough, chest tightness, shortness of breath, wheezing and stridor.  Cardiovascular: Negative for chest pain, palpitations and leg swelling.  Gastrointestinal: Negative for nausea, vomiting, abdominal pain, diarrhea, constipation, blood in stool, abdominal distention and anal bleeding.  Genitourinary: right knee swelling and pain Skin: Negative for color change, pallor, rash and wound.  Neurological: Negative for dizziness, tremors, weakness and light-headedness. Hard of hearing Hematological: Negative for adenopathy. Does not bruise/bleed easily.  Psychiatric/Behavioral: Negative for behavioral problems, confusion, sleep disturbance, dysphoric mood, decreased concentration and agitation.     OBJECTIVE: Temp:  [97.3 F (36.3 C)-98.4 F (36.9 C)] 98.3 F (36.8 C) (01/31 1312) Pulse Rate:  [77-96] 96 (01/31 1312) Resp:  [13-21] 16 (01/31 1312) BP: (102-171)/(54-90) 112/70 (01/31 1312) SpO2:  [92 %-100 %] 95 % (01/31 1312) Physical Exam  Constitutional: He is oriented to person, place, and time. He appears well-developed and well-nourished. No distress.  HENT:  Mouth/Throat: Oropharynx is clear and moist. No oropharyngeal exudate.  Cardiovascular: Normal rate, regular rhythm and normal heart sounds. Exam reveals no gallop and no friction rub.  No murmur heard.  Pulmonary/Chest: Effort normal and breath sounds normal. No respiratory distress. He has no wheezes.  Abdominal: Soft. Bowel sounds are normal. He exhibits no distension. There is no tenderness.  Ext: right knee wrapped with wound vac drain in place Neurological:  He is alert and oriented to person, place, and time.  Skin: Skin is warm and dry. No rash noted. No erythema.  Psychiatric: He has a normal mood and affect. His behavior is normal.     LABS: Results for orders placed or performed during the hospital encounter of 07/30/19 (from the past 48 hour(s))  Wound  or Superficial Culture     Status: None (Preliminary result)   Collection Time: 07/30/19 11:20 AM   Specimen: Wound  Result Value Ref Range   Specimen Description WOUND RIGHT KNEE    Special Requests Normal    Gram Stain      RARE WBC PRESENT, PREDOMINANTLY PMN ABUNDANT GRAM NEGATIVE RODS FEW GRAM POSITIVE COCCI IN CLUSTERS Performed at [facility], [place] [address] [place], [place] [address]    Culture ABUNDANT PSEUDOMONAS AERUGINOSA    Report Status PENDING   CBC with Differential/Platelet     Status: Abnormal   Collection Time: 07/30/19 11:40 AM  Result Value Ref Range   WBC 7.6 4.0 - 10.5 K/uL   RBC 3.95 (L) 4.22 - 5.81 MIL/uL   Hemoglobin 12.0 (L) 13.0 - 17.0 g/dL   HCT 36.9 (L) 39.0 - 52.0 %   MCV 93.4 80.0 - 100.0 fL   MCH 30.4 26.0 - 34.0 pg   MCHC 32.5 30.0 - 36.0 g/dL   RDW 13.6 11.5 - 15.5 %   Platelets 242 150 - 400 K/uL   nRBC 0.0 0.0 - 0.2 %   Neutrophils Relative % 72 %   Neutro Abs 5.5 1.7 - 7.7 K/uL   Lymphocytes Relative 15 %   Lymphs Abs 1.1 0.7 - 4.0 K/uL   Monocytes Relative 12 %   Monocytes Absolute 0.9 0.1 - 1.0 K/uL   Eosinophils Relative 1 %   Eosinophils Absolute 0.1 0.0 - 0.5 K/uL   Basophils Relative 0 %   Basophils Absolute 0.0 0.0 - 0.1 K/uL   Immature Granulocytes 0 %   Abs Immature Granulocytes 0.03 0.00 - 0.07 K/uL    Comment: Performed at [facility], [address] [place], [place] [address]  Basic metabolic panel     Status: Abnormal   Collection Time: 07/30/19 11:40 AM  Result Value Ref Range   Sodium 133 (L) 135 - 145 mmol/L   Potassium 3.4 (L) 3.5 - 5.1 mmol/L   Chloride 99 98 - 111 mmol/L   CO2 24 22 - 32 mmol/L   Glucose, Bld 129 (H) 70 - 99 mg/dL   BUN 17 8 - 23 mg/dL   Creatinine, Ser 1.24 0.61 - 1.24 mg/dL   Calcium 9.1 8.9 - 10.3 mg/dL   GFR calc non Af Amer 54 (L) >60 mL/min   GFR calc Af Amer >60 >60 mL/min   Anion gap 10 5 - 15    Comment: Performed at [facility] [address],  [place], [place] [address]  Synovial cell count + diff, w/ crystals     Status: Abnormal   Collection Time: 07/30/19  2:44 PM  Result Value Ref Range   Color, Synovial RED (A) YELLOW   Appearance-Synovial CLOUDY (A) CLEAR   Crystals, Fluid NO CRYSTALS SEEN    WBC, Synovial 29,250 (H) 0 - 200 /cu mm    Comment: COUNT MAY BE INACCURATE DUE TO FIBRIN CLUMPS.   Neutrophil, Synovial 93 (H) 0 - 25 %   Lymphocytes-Synovial Fld 1 0 - 20 %   Monocyte-Macrophage-Synovial Fluid 6 (L) 50 -  90 %   Eosinophils-Synovial 0 0 - 1 %    Comment: Performed at [facility], [address], [place], [place] [address]  Body fluid culture     Status: None (Preliminary result)   Collection Time: 07/30/19  2:44 PM   Specimen: Synovium  Result Value Ref Range   Specimen Description SYNOVIAL RIGHT KNEE    Special Requests NONE    Gram Stain      ABUNDANT WBC PRESENT, PREDOMINANTLY PMN NO ORGANISMS SEEN    Culture      NO GROWTH < 24 HOURS Performed at [facility], [address], [place], [place] [address]    Report Status PENDING   SARS CORONAVIRUS 2 (TAT 6-24 HRS) Nasopharyngeal Nasopharyngeal Swab     Status: None   Collection Time: 07/30/19  3:25 PM   Specimen: Nasopharyngeal Swab  Result Value Ref Range   SARS Coronavirus 2 NEGATIVE NEGATIVE    Comment: (NOTE) SARS-CoV-2 target nucleic acids are NOT DETECTED. The SARS-CoV-2 RNA is generally detectable in upper and lower respiratory specimens during the acute phase of infection. Negative results do not preclude SARS-CoV-2 infection, do not rule out co-infections with other pathogens, and should not be used as the sole basis for treatment or other patient management decisions. Negative results must be combined with clinical observations, patient history, and epidemiological information. The expected result is Negative. Fact Sheet for Patients: HairSlick.no Fact Sheet for Healthcare  Providers: quierodirigir.com This test is not yet approved or cleared by the [place] FDA and  has been authorized for detection and/or diagnosis of SARS-CoV-2 by FDA under an Emergency Use Authorization (EUA). This EUA will remain  in effect (meaning this test can be used) for the duration of the COVID-19 declaration under Section 56 4(b)(1) of the Act, 21 U.S.C. section 360bbb-3(b)(1), unless the authorization is terminated or revoked sooner. Performed at [facility], [address], [place], [place] [address]   C-reactive protein     Status: Abnormal   Collection Time: 07/30/19  6:15 PM  Result Value Ref Range   CRP 7.0 (H) <1.0 mg/dL    Comment: Performed at [facility], [address] [place], [place] [address]  Type and screen [facility]     Status: None   Collection Time: 07/30/19  6:15 PM  Result Value Ref Range   ABO/RH(D) O NEG    Antibody Screen NEG    Sample Expiration      08/02/2019,2359 Performed at [facility], [address], [place], [place] [address]   Sedimentation rate     Status: Abnormal   Collection Time: 07/30/19  6:15 PM  Result Value Ref Range   Sed Rate 45 (H) 0 - 16 mm/hr    Comment: Performed at [facility], [address] [place], [place] [address]  ABO/Rh     Status: None   Collection Time: 07/30/19  6:15 PM  Result Value Ref Range   ABO/RH(D)      O NEG Performed at [facility], [address], [place], [place] [address]   MRSA PCR Screening     Status: None   Collection Time: 07/30/19 10:30 PM   Specimen: Nasal Mucosa; Nasopharyngeal  Result Value Ref Range   MRSA by PCR NEGATIVE NEGATIVE    Comment:        The GeneXpert MRSA Assay (FDA approved for NASAL specimens only), is one component  of a comprehensive MRSA colonization surveillance program. It is not intended to diagnose MRSA infection nor to guide or monitor treatment for MRSA  infections. Performed at [facility], [address], [place], [place] [address]     MICRO: 1/30 aspirate pending 1/31 or tissue pending 1/30 wound cx - PsA IMAGING: DG Knee 1-2 Views Right  Result Date: 07/30/2019 CLINICAL DATA:  Knee replacement a few months ago. Incision has never healed properly. Pain. EXAM: RIGHT KNEE - 1-2 VIEW COMPARISON:  None. FINDINGS: The patient is status post left knee replacement. Femoral and tibial components are in good position with no evidence of loosening or failure. Two screws extend through the distal femur. No joint effusion. A skin defect is seen anteriorly on the lateral view, likely the un healed incision. No definite bony erosion. IMPRESSION: 1. An apparent skin defect is seen anteriorly on the lateral view, consistent with the unhealed incision. No definitive erosion of the underlying patella. Surgical hardware is in good position. Electronically Signed   By: Gerome Sam III M.D   On: 07/30/2019 14:53   CT Head Wo Contrast  Result Date: 07/30/2019 CLINICAL DATA:  Fall today with head and neck pain.  Ataxia. EXAM: CT HEAD WITHOUT CONTRAST CT CERVICAL SPINE WITHOUT CONTRAST TECHNIQUE: Multidetector CT imaging of the head and cervical spine was performed following the standard protocol without intravenous contrast. Multiplanar CT image reconstructions of the cervical spine were also generated. COMPARISON:  Head CT 06/24/2019 FINDINGS: CT HEAD FINDINGS Brain: Ventricles and cisterns are within normal. There is mild stable prominence of the CSF spaces compatible with atrophic change. Moderate chronic ischemic microvascular disease is present. Small old lacunar infarct over the left lentiform nucleus. No evidence of mass, mass effect, shift of midline structures or acute hemorrhage. No evidence of acute infarction. Vascular: No hyperdense vessel or unexpected calcification. Skull: Normal. Negative for fracture or focal lesion. Sinuses/Orbits: Orbits  are normal. Hypoplastic frontal sinuses. Opacification over the sphenoid sinus slightly worse. Mastoid air cells are clear. Other: None. CT CERVICAL SPINE FINDINGS Alignment: Very subtle anterior subluxation of C5 and C6 likely degenerative in nature. No traumatic subluxation. Skull base and vertebrae: There is moderate spondylosis throughout the cervical spine. Vertebral body heights are normal. There is smooth sclerotic bordered lucency through the base of the dens separating the dens from the remainder of the C2 vertebral body compatible with an old fracture. There is uncovertebral joint spurring and moderate facet arthropathy. There is significant neural foraminal narrowing at multiple levels worse along the right side at the C3-4 and C4-5 levels. Soft tissues and spinal canal: Prevertebral soft tissues are normal. Spinal canal is unremarkable. Disc levels:  No significant disc space narrowing. Upper chest: No acute findings. Other: None. IMPRESSION: 1.  No acute brain injury. 2. Chronic ischemic microvascular disease and age related atrophic change. 3. No acute cervical spine injury. 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 4. Moderate spondylosis throughout the cervical spine with moderate bilateral neural foraminal narrowing at multiple levels as described. 5. 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 Electronically Signed   By: Elberta Fortis M.D.   On: 07/30/2019 13:40   CT Cervical Spine Wo Contrast  Result Date: 07/30/2019 CLINICAL DATA:  Fall today with head and neck pain.  Ataxia. EXAM: CT HEAD WITHOUT CONTRAST CT CERVICAL SPINE WITHOUT CONTRAST TECHNIQUE: Multidetector CT imaging of the head and cervical spine was performed following the standard protocol without intravenous contrast. Multiplanar CT image reconstructions  of the cervical spine were also generated. COMPARISON:  Head CT 06/24/2019 FINDINGS: CT HEAD FINDINGS Brain: Ventricles and cisterns are within normal. There is  mild stable prominence of the CSF spaces compatible with atrophic change. Moderate chronic ischemic microvascular disease is present. Small old lacunar infarct over the left lentiform nucleus. No evidence of mass, mass effect, shift of midline structures or acute hemorrhage. No evidence of acute infarction. Vascular: No hyperdense vessel or unexpected calcification. Skull: Normal. Negative for fracture or focal lesion. Sinuses/Orbits: Orbits are normal. Hypoplastic frontal sinuses. Opacification over the sphenoid sinus slightly worse. Mastoid air cells are clear. Other: None. CT CERVICAL SPINE FINDINGS Alignment: Very subtle anterior subluxation of C5 and C6 likely degenerative in nature. No traumatic subluxation. Skull base and vertebrae: There is moderate spondylosis throughout the cervical spine. Vertebral body heights are normal. There is smooth sclerotic bordered lucency through the base of the dens separating the dens from the remainder of the C2 vertebral body compatible with an old fracture. There is uncovertebral joint spurring and moderate facet arthropathy. There is significant neural foraminal narrowing at multiple levels worse along the right side at the C3-4 and C4-5 levels. Soft tissues and spinal canal: Prevertebral soft tissues are normal. Spinal canal is unremarkable. Disc levels:  No significant disc space narrowing. Upper chest: No acute findings. Other: None. IMPRESSION: 1.  No acute brain injury. 2. Chronic ischemic microvascular disease and age related atrophic change. 3. No acute cervical spine injury. 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 4. Moderate spondylosis throughout the cervical spine with moderate bilateral neural foraminal narrowing at multiple levels as described. 5. 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 Electronically Signed   By: Elberta Fortisaniel  Boyle M.D.   On: 07/30/2019 13:40   Chest Portable 1 View  Result Date: 07/30/2019 CLINICAL DATA:  Right knee  infection. EXAM: PORTABLE CHEST 1 VIEW COMPARISON:  June 28, 2019 FINDINGS: Mild to moderate severity chronic appearing increased interstitial lung markings are seen. This is stable in appearance when compared to the prior study. Very mild, stable linear scarring and/or atelectasis is seen within the left lung base. There is no evidence of a pleural effusion or pneumothorax. The cardiac silhouette is markedly enlarged and unchanged in size. Chronic fourth and fifth right rib fractures are seen. Degenerative changes seen throughout the thoracic spine. IMPRESSION: 1. Chronic appearing increased interstitial lung markings with mild, stable left basilar linear scarring and/or atelectasis. Electronically Signed   By: Aram Candelahaddeus  Houston M.D.   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

## 2019-07-31 NOTE — Plan of Care (Signed)
  Problem: Pain Managment: Goal: General experience of comfort will improve Outcome: Progressing   

## 2019-07-31 NOTE — Consult Note (Signed)
ORTHOPAEDIC CONSULTATION  REQUESTING PHYSICIAN: Frederico Hamman, MD  Chief Complaint: Septic right total knee with ischemic wound dehiscence over the patella and patellar tendon.   HPI: Devin [name] is a 84 y.o. male who presents with septic right total knee arthroplasty with ischemic wound dehiscence over the patella and patella tendon right knee.  Patient is 2 months status post right total knee arthroplasty.  Patient has had progressive wound dehiscence and has developed an effusion of the right knee.  Patient's initial aspirate showed cloudy fluid.  Past Medical History:  Diagnosis Date  . Hypertension   . Hypertension    Past Surgical History:  Procedure Laterality Date  . CLOSED REDUCTION FEMORAL SHAFT FRACTURE Left   . TOTAL KNEE ARTHROPLASTY Right 06/03/2019   Procedure: TOTAL KNEE ARTHROPLASTY, ORIF MEDIAL CONDYLE, LATERAL RELEASE;  Surgeon: Frederico Hamman, MD;  Location: [facility];  Service: Orthopedics;  Laterality: Right;   Social History   Socioeconomic History  . Marital status: Widowed    Spouse name: Not on file  . Number of children: 2  . Years of education: Not on file  . Highest education level: Not on file  Occupational History  . Not on file  Tobacco Use  . Smoking status: Never Smoker  . Smokeless tobacco: Never Used  Substance and Sexual Activity  . Alcohol use: Never  . Drug use: Never  . Sexual activity: Not on file  Other Topics Concern  . Not on file  Social History Narrative  . Not on file   Social Determinants of Health   Financial Resource Strain:   . Difficulty of Paying Living Expenses: Not on file  Food Insecurity:   . Worried About Programme researcher, broadcasting/film/video in the Last Year: Not on file  . Ran Out of Food in the Last Year: Not on file  Transportation Needs:   . Lack of Transportation (Medical): Not on file  . Lack of Transportation (Non-Medical): Not on file  Physical Activity:   . Days of Exercise per Week: Not on file  .  Minutes of Exercise per Session: Not on file  Stress:   . Feeling of Stress : Not on file  Social Connections:   . Frequency of Communication with Friends and Family: Not on file  . Frequency of Social Gatherings with Friends and Family: Not on file  . Attends Religious Services: Not on file  . Active Member of Clubs or Organizations: Not on file  . Attends Banker Meetings: Not on file  . Marital Status: Not on file   Family History  Problem Relation Age of Onset  . Stroke Mother    - negative except otherwise stated in the family history section No Known Allergies Prior to Admission medications   Medication Sig Start Date End Date Taking? Authorizing Provider  chlorthalidone (HYGROTON) 25 MG tablet Take 25 mg by mouth daily. 03/22/19  Yes [provider]  [employer] D3 25 MCG (1000 UT) capsule Take 1,000 Units by mouth daily. 06/29/19  Yes [provider]  labetalol (NORMODYNE) 300 MG tablet Take 300 mg by mouth 2 (two) times daily.   Yes [provider]  losartan (COZAAR) 100 MG tablet Take 100 mg by mouth daily. 07/11/19  Yes [provider]  acetaminophen (TYLENOL) 325 MG tablet Take 2 tablets (650 mg total) by mouth every 4 (four) hours as needed. Patient not taking: Reported on 07/28/2019 06/03/19 06/02/20  Margart Sickles, PA-C   DG Knee 1-2  Views Right  Result Date: 07/30/2019 CLINICAL DATA:  Knee replacement a few months ago. Incision has never healed properly. Pain. EXAM: RIGHT KNEE - 1-2 VIEW COMPARISON:  None. FINDINGS: The patient is status post left knee replacement. Femoral and tibial components are in good position with no evidence of loosening or failure. Two screws extend through the distal femur. No joint effusion. A skin defect is seen anteriorly on the lateral view, likely the un healed incision. No definite bony erosion. IMPRESSION: 1. An apparent skin defect is seen anteriorly on the lateral view, consistent with the unhealed  incision. No definitive erosion of the underlying patella. Surgical hardware is in good position. Electronically Signed   By: Gerome Sam III M.D   On: 07/30/2019 14:53   CT Head Wo Contrast  Result Date: 07/30/2019 CLINICAL DATA:  Fall today with head and neck pain.  Ataxia. EXAM: CT HEAD WITHOUT CONTRAST CT CERVICAL SPINE WITHOUT CONTRAST TECHNIQUE: Multidetector CT imaging of the head and cervical spine was performed following the standard protocol without intravenous contrast. Multiplanar CT image reconstructions of the cervical spine were also generated. COMPARISON:  Head CT 06/24/2019 FINDINGS: CT HEAD FINDINGS Brain: Ventricles and cisterns are within normal. There is mild stable prominence of the CSF spaces compatible with atrophic change. Moderate chronic ischemic microvascular disease is present. Small old lacunar infarct over the left lentiform nucleus. No evidence of mass, mass effect, shift of midline structures or acute hemorrhage. No evidence of acute infarction. Vascular: No hyperdense vessel or unexpected calcification. Skull: Normal. Negative for fracture or focal lesion. Sinuses/Orbits: Orbits are normal. Hypoplastic frontal sinuses. Opacification over the sphenoid sinus slightly worse. Mastoid air cells are clear. Other: None. CT CERVICAL SPINE FINDINGS Alignment: Very subtle anterior subluxation of C5 and C6 likely degenerative in nature. No traumatic subluxation. Skull base and vertebrae: There is moderate spondylosis throughout the cervical spine. Vertebral body heights are normal. There is smooth sclerotic bordered lucency through the base of the dens separating the dens from the remainder of the C2 vertebral body compatible with an old fracture. There is uncovertebral joint spurring and moderate facet arthropathy. There is significant neural foraminal narrowing at multiple levels worse along the right side at the C3-4 and C4-5 levels. Soft tissues and spinal canal: Prevertebral soft  tissues are normal. Spinal canal is unremarkable. Disc levels:  No significant disc space narrowing. Upper chest: No acute findings. Other: None. IMPRESSION: 1.  No acute brain injury. 2. Chronic ischemic microvascular disease and age related atrophic change. 3. No acute cervical spine injury. 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 4. Moderate spondylosis throughout the cervical spine with moderate bilateral neural foraminal narrowing at multiple levels as described. 5. 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 Electronically Signed   By: Elberta Fortis M.D.   On: 07/30/2019 13:40   CT Cervical Spine Wo Contrast  Result Date: 07/30/2019 CLINICAL DATA:  Fall today with head and neck pain.  Ataxia. EXAM: CT HEAD WITHOUT CONTRAST CT CERVICAL SPINE WITHOUT CONTRAST TECHNIQUE: Multidetector CT imaging of the head and cervical spine was performed following the standard protocol without intravenous contrast. Multiplanar CT image reconstructions of the cervical spine were also generated. COMPARISON:  Head CT 06/24/2019 FINDINGS: CT HEAD FINDINGS Brain: Ventricles and cisterns are within normal. There is mild stable prominence of the CSF spaces compatible with atrophic change. Moderate chronic ischemic microvascular disease is present. Small old lacunar infarct over the left lentiform nucleus. No evidence of mass, mass effect,  shift of midline structures or acute hemorrhage. No evidence of acute infarction. Vascular: No hyperdense vessel or unexpected calcification. Skull: Normal. Negative for fracture or focal lesion. Sinuses/Orbits: Orbits are normal. Hypoplastic frontal sinuses. Opacification over the sphenoid sinus slightly worse. Mastoid air cells are clear. Other: None. CT CERVICAL SPINE FINDINGS Alignment: Very subtle anterior subluxation of C5 and C6 likely degenerative in nature. No traumatic subluxation. Skull base and vertebrae: There is moderate spondylosis throughout the cervical spine.  Vertebral body heights are normal. There is smooth sclerotic bordered lucency through the base of the dens separating the dens from the remainder of the C2 vertebral body compatible with an old fracture. There is uncovertebral joint spurring and moderate facet arthropathy. There is significant neural foraminal narrowing at multiple levels worse along the right side at the C3-4 and C4-5 levels. Soft tissues and spinal canal: Prevertebral soft tissues are normal. Spinal canal is unremarkable. Disc levels:  No significant disc space narrowing. Upper chest: No acute findings. Other: None. IMPRESSION: 1.  No acute brain injury. 2. Chronic ischemic microvascular disease and age related atrophic change. 3. No acute cervical spine injury. 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 4. Moderate spondylosis throughout the cervical spine with moderate bilateral neural foraminal narrowing at multiple levels as described. 5. 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 Electronically Signed   By: Elberta Fortis M.D.   On: 07/30/2019 13:40   Chest Portable 1 View  Result Date: 07/30/2019 CLINICAL DATA:  Right knee infection. EXAM: PORTABLE CHEST 1 VIEW COMPARISON:  June 28, 2019 FINDINGS: Mild to moderate severity chronic appearing increased interstitial lung markings are seen. This is stable in appearance when compared to the prior study. Very mild, stable linear scarring and/or atelectasis is seen within the left lung base. There is no evidence of a pleural effusion or pneumothorax. The cardiac silhouette is markedly enlarged and unchanged in size. Chronic fourth and fifth right rib fractures are seen. Degenerative changes seen throughout the thoracic spine. IMPRESSION: 1. Chronic appearing increased interstitial lung markings with mild, stable left basilar linear scarring and/or atelectasis. Electronically Signed   By: Aram Candela M.D.   On: 07/30/2019 18:41   - pertinent xrays, CT, MRI studies were  reviewed and independently interpreted  Positive ROS: All other systems have been reviewed and were otherwise negative with the exception of those mentioned in the HPI and as above.  Physical Exam: General: Alert, no acute distress Psychiatric: Patient is competent for consent with normal mood and affect Lymphatic: No axillary or cervical lymphadenopathy Cardiovascular: No pedal edema Respiratory: No cyanosis, no use of accessory musculature GI: No organomegaly, abdomen is soft and non-tender    Images:  @ENCIMAGES @  Labs:  Lab Results  Component Value Date   ESRSEDRATE 45 (H) 07/30/2019   CRP 7.0 (H) 07/30/2019   REPTSTATUS PENDING 07/30/2019   GRAMSTAIN  07/30/2019    ABUNDANT WBC PRESENT, PREDOMINANTLY PMN NO ORGANISMS SEEN Performed at [facility], [address], [place], [place]    CULT PENDING 07/30/2019    Lab Results  Component Value Date   ALBUMIN 4.1 05/25/2019    Neurologic: Patient does  have protective sensation bilateral lower extremities.   MUSCULOSKELETAL:   Skin: Examination patient has a gangrenous wound over the patella tendon and patella.  This is an area of approximately 5 cm in diameter.  There is a knee effusion there is exposed distal pole the patella the patella tendon is necrotic the  polyethylene tray is palpable through the patella tendon.  Assessment: Assessment septic right total knee arthroplasty with gangrenous wound dehiscence over the patella and patella tendon.  Plan: I will assist Dr. French Ana with his surgery, plan for excision of the necrotic bone and necrotic tendon extensive synovectomy of the right knee single component exchange of the polyethylene tray of the right knee with plan for local tissue rearrangement for wound closure over a cleanse choice wound VAC sponge with installation therapy.  Plan for placement of stimulant antibiotic beads 10 cc with 1 g vancomycin and 240 mg gentamicin. plan for return to  the operating room for medial gastrocnemius flap.  Thank you for the consult and the opportunity to see Devin [name], [place] [phone] 10:14 AM

## 2019-07-31 NOTE — Progress Notes (Signed)
Patient attempting to get out of bed. Patient instructed to stay in the bed for his safety. Bed in lowest position, double floor mats in place, bed alarm on.

## 2019-07-31 NOTE — Anesthesia Postprocedure Evaluation (Signed)
Anesthesia Post Note  Patient: Temitope J Mirabile  Procedure(s) Performed: RIGHT KNEE IRRIGATION AND DEBRIDEMENT with POLY EXCHANGE and PLACEMENT of STIMULAN BEADS (Right Knee) Application Of Wound Vac (Right Knee)     Patient location during evaluation: PACU Anesthesia Type: General Level of consciousness: awake and alert, patient cooperative and oriented Pain management: pain level controlled Vital Signs Assessment: post-procedure vital signs reviewed and stable Respiratory status: spontaneous breathing, nonlabored ventilation, respiratory function stable and patient connected to nasal cannula oxygen Cardiovascular status: blood pressure returned to baseline and stable Postop Assessment: no apparent nausea or vomiting Anesthetic complications: no    Last Vitals:  Vitals:   07/31/19 0915 07/31/19 0930  BP: (!) 102/57 (!) 114/54  Pulse: 77 79  Resp: (!) 21 19  Temp: 36.7 C 36.7 C  SpO2: 100% 92%    Last Pain:  Vitals:   07/31/19 0915  TempSrc:   PainSc: 0-No pain                 Kahne Helfand,E. Tallula Grindle

## 2019-07-31 NOTE — Progress Notes (Signed)
Patient to OR

## 2019-07-31 NOTE — Transfer of Care (Signed)
Immediate Anesthesia Transfer of Care Note  Patient: Devin [name]  Procedure(s) Performed: RIGHT KNEE IRRIGATION AND DEBRIDEMENT with POLY EXCHANGE and PLACEMENT of STIMULAN BEADS (Right Knee) Application Of Wound Vac (Right Knee)  Patient Location: PACU  Anesthesia Type:General  Level of Consciousness: awake, oriented and patient cooperative  Airway & Oxygen Therapy: Patient Spontanous Breathing and Patient connected to face mask oxygen  Post-op Assessment: Report given to RN and Post -op Vital signs reviewed and stable  Post vital signs: Reviewed and stable  Last Vitals:  Vitals Value Taken Time  BP    Temp    Pulse    Resp    SpO2      Last Pain:  Vitals:   07/31/19 0915  TempSrc:   PainSc: (P) 0-No pain      Patients Stated Pain Goal: 3 (07/31/19 0709)  Complications: No apparent anesthesia complications

## 2019-07-31 NOTE — Progress Notes (Addendum)
Pharmacy Antibiotic Note  Devin [name] is a 84 y.o. male admitted on 07/30/2019 with septic right total knee arthroplasty w/ gangrenous wound dehisence over patella / patella tendon. Patient is 2 months s/p right total knee arthroplasty.  Pharmacy has been consulted for vancomycin and cefepime dosing.   Patient received an appropriate loading dose of vancomycin 1500mg  this morning prior to visiting the OR. Patient is afrebrile, HR / RR stable. Patient was on ceftriaxone per MD but never received their dose.    Plan:  Start vancomycin 1000 mg IV Q 24 hrs. Goal AUC 400-550. Expected AUC: 507.4   SCr used: 1.24 Monitor signs / symptoms of infection, clinical improvement, levels at steady state, de-escalation when able and LOT. Stop ceftriaxone Start cefepime 2 gram IV every 12 hours   Temp (24hrs), Avg:98 F (36.7 C), Min:97.3 F (36.3 C), Max:98.4 F (36.9 C)  Recent Labs  Lab 07/30/19 1140  WBC 7.6  CREATININE 1.24    Estimated Creatinine Clearance: 45.7 mL/min (by C-G formula based on SCr of 1.24 mg/dL).    No Known Allergies  Antimicrobials this admission: 1/31 Vancomycin >>  1/31 Ceftriaxone >> 1/31  1/31 Cefepime >>   Dose adjustments this admission: n/a  Microbiology results: 1/30 wound right knee >> rare WBC, abundant GNRs, few GPC in clusters; now ABUNDANT PSEUDOMONAS AERUGINOSA 1/30 synovial right knee body fluid >> NG x24h 1/31 surgical deep wound tissue culture >> sent    Thank you for the interesting consult and for involving pharmacy in this patient's care.  2/31, PharmD, BCPS 07/31/2019 11:28 AM PGY-2 Pharmacy Administration Resident Please check [email or web address] for unit-specific pharmacist phone numbers

## 2019-07-31 NOTE — Anesthesia Procedure Notes (Signed)
Procedure Name: Intubation Date/Time: 07/31/2019 7:47 AM Performed by: Julian Reil, CRNA Pre-anesthesia Checklist: Patient identified, Emergency Drugs available, Suction available and Patient being monitored Patient Re-evaluated:Patient Re-evaluated prior to induction Oxygen Delivery Method: Circle system utilized Preoxygenation: Pre-oxygenation with 100% oxygen Induction Type: IV induction Ventilation: Mask ventilation without difficulty and Oral airway inserted - appropriate to patient size Laryngoscope Size: Hyacinth Meeker and 3 Grade View: Grade I Tube type: Oral Tube size: 7.0 mm Number of attempts: 1 Airway Equipment and Method: Stylet Placement Confirmation: ETT inserted through vocal cords under direct vision,  positive ETCO2 and breath sounds checked- equal and bilateral Secured at: 23 cm Tube secured with: Tape Dental Injury: Teeth and Oropharynx as per pre-operative assessment  Comments: Upper and lower dentures removed prior to DL.

## 2019-07-31 NOTE — Anesthesia Preprocedure Evaluation (Addendum)
Anesthesia Evaluation  Patient identified by MRN, date of birth, ID band Patient awake    Reviewed: Allergy & Precautions, NPO status , Patient's Chart, lab work & pertinent test results, reviewed documented beta blocker date and time   History of Anesthesia Complications Negative for: history of anesthetic complications  Airway Mallampati: II  TM Distance: >3 FB Neck ROM: Full    Dental  (+) Upper Dentures, Lower Dentures, Edentulous Upper, Edentulous Lower, Dental Advisory Given   Pulmonary neg pulmonary ROS,  07/30/2019 SARS coronavirus NEG   breath sounds clear to auscultation       Cardiovascular hypertension, Pt. on medications and Pt. on home beta blockers (-) angina Rhythm:Regular Rate:Normal  05/2019 Stress: no ST segment deviation noted during stress.The study is normal. There are no perfusion defects consistent with prior infarct or current ischemia.  ejection fraction is normal (55-65%).   Neuro/Psych negative neurological ROS     GI/Hepatic GERD  Controlled,  Endo/Other  negative endocrine ROS  Renal/GU negative Renal ROS     Musculoskeletal  (+) Arthritis ,   Abdominal   Peds  Hematology negative hematology ROS (+)   Anesthesia Other Findings   Reproductive/Obstetrics                            Anesthesia Physical Anesthesia Plan  ASA: II  Anesthesia Plan: General   Post-op Pain Management:    Induction: Intravenous  PONV Risk Score and Plan: 2  Airway Management Planned: Oral ETT  Additional Equipment:   Intra-op Plan:   Post-operative Plan: Extubation in OR  Informed Consent: I have reviewed the patients History and Physical, chart, labs and discussed the procedure including the risks, benefits and alternatives for the proposed anesthesia with the patient or authorized representative who has indicated his/her understanding and acceptance.     Dental  advisory given  Plan Discussed with: CRNA and Surgeon  Anesthesia Plan Comments:        Anesthesia Quick Evaluation

## 2019-08-01 ENCOUNTER — Encounter: Payer: Self-pay | Admitting: *Deleted

## 2019-08-01 DIAGNOSIS — B965 Pseudomonas (aeruginosa) (mallei) (pseudomallei) as the cause of diseases classified elsewhere: Secondary | ICD-10-CM

## 2019-08-01 DIAGNOSIS — M00861 Arthritis due to other bacteria, right knee: Secondary | ICD-10-CM

## 2019-08-01 LAB — BASIC METABOLIC PANEL
Anion gap: 11 (ref 5–15)
BUN: 17 mg/dL (ref 8–23)
CO2: 23 mmol/L (ref 22–32)
Calcium: 9.2 mg/dL (ref 8.9–10.3)
Chloride: 98 mmol/L (ref 98–111)
Creatinine, Ser: 0.95 mg/dL (ref 0.61–1.24)
GFR calc Af Amer: >60 mL/min (ref >60)
GFR calc non Af Amer: >60 mL/min (ref >60)
Glucose, Bld: 142 mg/dL — ABNORMAL HIGH (ref 70–99)
Potassium: 4.1 mmol/L (ref 3.5–5.1)
Sodium: 132 mmol/L — ABNORMAL LOW (ref 135–145)

## 2019-08-01 LAB — AEROBIC CULTURE W GRAM STAIN (SUPERFICIAL SPECIMEN): Special Requests: NORMAL

## 2019-08-01 LAB — CBC
HCT: 30.1 % — ABNORMAL LOW (ref 39.0–52.0)
Hemoglobin: 10.1 g/dL — ABNORMAL LOW (ref 13.0–17.0)
MCH: 30.3 pg (ref 26.0–34.0)
MCHC: 33.6 g/dL (ref 30.0–36.0)
MCV: 90.4 fL (ref 80.0–100.0)
Platelets: 251 10*3/uL (ref 150–400)
RBC: 3.33 MIL/uL — ABNORMAL LOW (ref 4.22–5.81)
RDW: 13.3 % (ref 11.5–15.5)
WBC: 10.2 10*3/uL (ref 4.0–10.5)
nRBC: 0 % (ref 0.0–0.2)

## 2019-08-01 MED ORDER — ENSURE ENLIVE PO LIQD
237.0000 mL | Freq: Three times a day (TID) | ORAL | Status: DC
  Administered 2019-08-01 – 2019-08-11 (×27): 237 mL via ORAL

## 2019-08-01 MED ORDER — ADULT MULTIVITAMIN W/MINERALS CH
1.0000 | ORAL_TABLET | Freq: Every day | ORAL | Status: DC
  Administered 2019-08-01 – 2019-08-11 (×10): 1 via ORAL
  Filled 2019-08-01 (×10): qty 1

## 2019-08-01 NOTE — Progress Notes (Signed)
Subjective:  " I want to go home tomorrow"   Antibiotics:  Anti-infectives (From admission, onward)   Start     Dose/Rate Route Frequency Ordered Stop   08/01/19 0800  vancomycin (VANCOCIN) IVPB 1000 mg/200 mL premix  Status:  Discontinued     1,000 mg 200 mL/hr over 60 Minutes Intravenous Every 24 hours 07/31/19 1131 08/01/19 1051   07/31/19 1345  ceFEPIme (MAXIPIME) 2 g in sodium chloride 0.9 % 100 mL IVPB     2 g 200 mL/hr over 30 Minutes Intravenous Every 12 hours 07/31/19 1336     07/31/19 1100  cefTRIAXone (ROCEPHIN) 2 g in sodium chloride 0.9 % 100 mL IVPB  Status:  Discontinued     2 g 200 mL/hr over 30 Minutes Intravenous Every 24 hours 07/31/19 1054 07/31/19 1329   07/31/19 0832  vancomycin (VANCOCIN) powder  Status:  Discontinued       As needed 07/31/19 0833 07/31/19 0915   07/31/19 0831  gentamicin (GARAMYCIN) injection  Status:  Discontinued       As needed 07/31/19 0832 07/31/19 0915   07/31/19 0815  vancomycin (VANCOREADY) IVPB 1500 mg/300 mL     1,500 mg 150 mL/hr over 120 Minutes Intravenous To Surgery 07/31/19 0807 07/31/19 1021      Medications: Scheduled Meds: . aspirin  81 mg Oral BID  . docusate sodium  100 mg Oral BID  . feeding supplement (ENSURE ENLIVE)  237 mL Oral TID BM  . labetalol  300 mg Oral BID  . losartan  100 mg Oral Daily  . multivitamin with minerals  1 tablet Oral Daily   Continuous Infusions: . sodium chloride    . ceFEPime (MAXIPIME) IV 2 g (08/01/19 0455)   PRN Meds:.acetaminophen, diphenhydrAMINE, HYDROmorphone (DILAUDID) injection, menthol-cetylpyridinium **OR** phenol, metoCLOPramide **OR** metoCLOPramide (REGLAN) injection, ondansetron **OR** ondansetron (ZOFRAN) IV, oxyCODONE, senna-docusate, sodium phosphate, sorbitol    Objective: Weight change:   Intake/Output Summary (Last 24 hours) at 08/01/2019 1459 Last data filed at 08/01/2019 7829 Gross per 24 hour  Intake 340 ml  Output 1900 ml  Net -1560 ml    Blood pressure 128/63, pulse 84, temperature 98.4 F (36.9 C), temperature source Oral, resp. rate 17, height 5\' 6"  (1.676 m), weight 77 kg, SpO2 96 %. Temp:  [97.5 F (36.4 C)-98.4 F (36.9 C)] 98.4 F (36.9 C) (02/01 0817) Pulse Rate:  [84-102] 84 (02/01 0817) Resp:  [16-18] 17 (02/01 0817) BP: (128-154)/(63-81) 128/63 (02/01 0817) SpO2:  [94 %-96 %] 96 % (02/01 0817) Weight:  [77 kg] 77 kg (02/01 1147)  Physical Exam: General: Alert and awake, oriented but confused HEENT: anicteric sclera, EOMI CVS regular rate, normal  Chest: , no wheezing, no respiratory distress Abdomen: soft non-distended,  Extremities: knee wrapped Skin: no rashes Neuro: nonfocal  CBC:    BMET Recent Labs    07/30/19 1140 08/01/19 0145  NA 133* 132*  K 3.4* 4.1  CL 99 98  CO2 24 23  GLUCOSE 129* 142*  BUN 17 17  CREATININE 1.24 0.95  CALCIUM 9.1 9.2     Liver Panel  No results for input(s): PROT, ALBUMIN, AST, ALT, ALKPHOS, BILITOT, BILIDIR, IBILI in the last 72 hours.     Sedimentation Rate Recent Labs    07/30/19 1815  ESRSEDRATE 45*   C-Reactive Protein Recent Labs    07/30/19 1815  CRP 7.0*    Micro Results: Recent Results (from the past 720 hour(s))  Wound  or Superficial Culture     Status: None   Collection Time: 07/30/19 11:20 AM   Specimen: Wound  Result Value Ref Range Status   Specimen Description WOUND RIGHT KNEE  Final   Special Requests Normal  Final   Gram Stain   Final    RARE WBC PRESENT, PREDOMINANTLY PMN ABUNDANT GRAM NEGATIVE RODS FEW GRAM POSITIVE COCCI IN CLUSTERS Performed at [facility], [address] [place], [place] [address]    Culture ABUNDANT PSEUDOMONAS AERUGINOSA  Final   Report Status 08/01/2019 FINAL  Final   Organism ID, Bacteria PSEUDOMONAS AERUGINOSA  Final      Susceptibility   Pseudomonas aeruginosa - MIC*    CEFTAZIDIME 2 SENSITIVE Sensitive     CIPROFLOXACIN <=0.25 SENSITIVE Sensitive     GENTAMICIN <=1  SENSITIVE Sensitive     IMIPENEM 1 SENSITIVE Sensitive     PIP/TAZO <=4 SENSITIVE Sensitive     CEFEPIME 2 SENSITIVE Sensitive     * ABUNDANT PSEUDOMONAS AERUGINOSA  Body fluid culture     Status: None (Preliminary result)   Collection Time: 07/30/19  2:44 PM   Specimen: Synovium  Result Value Ref Range Status   Specimen Description SYNOVIAL RIGHT KNEE  Final   Special Requests NONE  Final   Gram Stain   Final    ABUNDANT WBC PRESENT, PREDOMINANTLY PMN NO ORGANISMS SEEN    Culture   Final    NO GROWTH 2 DAYS Performed at [facility], [address] [place], [place] [address]    Report Status PENDING  Incomplete  SARS CORONAVIRUS 2 (TAT 6-24 HRS) Nasopharyngeal Nasopharyngeal Swab     Status: None   Collection Time: 07/30/19  3:25 PM   Specimen: Nasopharyngeal Swab  Result Value Ref Range Status   SARS Coronavirus 2 NEGATIVE NEGATIVE Final    Comment: (NOTE) SARS-CoV-2 target nucleic acids are NOT DETECTED. The SARS-CoV-2 RNA is generally detectable in upper and lower respiratory specimens during the acute phase of infection. Negative results do not preclude SARS-CoV-2 infection, do not rule out co-infections with other pathogens, and should not be used as the sole basis for treatment or other patient management decisions. Negative results must be combined with clinical observations, patient history, and epidemiological information. The expected result is Negative. Fact Sheet for Patients: HairSlick.no Fact Sheet for Healthcare Providers: quierodirigir.com This test is not yet approved or cleared by the [place] FDA and  has been authorized for detection and/or diagnosis of SARS-CoV-2 by FDA under an Emergency Use Authorization (EUA). This EUA will remain  in effect (meaning this test can be used) for the duration of the COVID-19 declaration under Section 56 4(b)(1) of the Act, 21 U.S.C. section  360bbb-3(b)(1), unless the authorization is terminated or revoked sooner. Performed at [facility], [address], [place], [place] [address]   MRSA PCR Screening     Status: None   Collection Time: 07/30/19 10:30 PM   Specimen: Nasal Mucosa; Nasopharyngeal  Result Value Ref Range Status   MRSA by PCR NEGATIVE NEGATIVE Final    Comment:        The GeneXpert MRSA Assay (FDA approved for NASAL specimens only), is one component of a comprehensive MRSA colonization surveillance program. It is not intended to diagnose MRSA infection nor to guide or monitor treatment for MRSA infections. Performed at [facility], [address] [place], [place] [address]   Aerobic Culture (superficial specimen)  Status: None (Preliminary result)   Collection Time: 07/31/19  8:15 AM   Specimen: Fluid  Result Value Ref Range Status   Specimen Description FLUID RIGHT KNEE  Final   Special Requests NONE  Final   Gram Stain NO WBC SEEN NO ORGANISMS SEEN   Final   Culture   Final    RARE PSEUDOMONAS AERUGINOSA SUSCEPTIBILITIES TO FOLLOW Performed at [facility], [place] [address] [place], [place] [address]    Report Status PENDING  Incomplete  Aerobic/Anaerobic Culture (surgical/deep wound)     Status: None (Preliminary result)   Collection Time: 07/31/19  8:23 AM   Specimen: PATH Cytology Misc. fluid; Tissue  Result Value Ref Range Status   Specimen Description TISSUE RIGHT KNEE  Final   Special Requests NONE  Final   Gram Stain   Final    MODERATE WBC PRESENT, PREDOMINANTLY PMN MODERATE GRAM POSITIVE COCCI IN PAIRS IN CLUSTERS FEW GRAM NEGATIVE RODS    Culture   Final    MODERATE PSEUDOMONAS AERUGINOSA SUSCEPTIBILITIES TO FOLLOW Performed at [facility], [place] [address] [place], [place] [address]    Report Status PENDING  Incomplete  Aerobic/Anaerobic Culture (surgical/deep wound)     Status: None (Preliminary result)   Collection Time: 07/31/19  8:28 AM    Specimen: PATH Cytology Misc. fluid; Other  Result Value Ref Range Status   Specimen Description TISSUE RIGHT KNEE DEEP TISSUE  Final   Special Requests NONE  Final   Gram Stain   Final    FEW WBC PRESENT, PREDOMINANTLY PMN NO ORGANISMS SEEN    Culture   Final    FEW PSEUDOMONAS AERUGINOSA SUSCEPTIBILITIES TO FOLLOW Performed at [facility], [place] [address], [place], [place] [address]    Report Status PENDING  Incomplete    Studies/Results: Chest Portable 1 View  Result Date: 07/30/2019 CLINICAL DATA:  Right knee infection. EXAM: PORTABLE CHEST 1 VIEW COMPARISON:  June 28, 2019 FINDINGS: Mild to moderate severity chronic appearing increased interstitial lung markings are seen. This is stable in appearance when compared to the prior study. Very mild, stable linear scarring and/or atelectasis is seen within the left lung base. There is no evidence of a pleural effusion or pneumothorax. The cardiac silhouette is markedly enlarged and unchanged in size. Chronic fourth and fifth right rib fractures are seen. Degenerative changes seen throughout the thoracic spine. IMPRESSION: 1. Chronic appearing increased interstitial lung markings with mild, stable left basilar linear scarring and/or atelectasis. Electronically Signed   By: Devin [name] M.D.   On: 07/30/2019 18:41      Assessment/Plan:  INTERVAL HISTORY: Pseudomonas growing from operative cultures and superficial cultures   Active Problems:   Infection of total right knee replacement (HCC)   Acute pain of right knee   Pseudomonas infection    Devin [name] is a 84 y.o. 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  His cultures are growing Pseudomonas aeruginosa  #1 PJI: Quite fortunate for him to be growing this organism.    Since he had a single exchange arthroplasty my plan would be 6 weeks of parenteral antibiotics followed by a minimum of 5 months of oral antibiotics which  in his case would need to be a fluoroquinolone IF the operative culture shows a S organism  Worry a great deal about high risk of C. difficile colitis in him with the use of this antibiotic.  There also be risk for CNS  side effects esp given his age.  We will follow along.     LOS: 2 days   Acey Lav 08/01/2019, 2:59 PM

## 2019-08-01 NOTE — Progress Notes (Signed)
Subjective: 1 Day Post-Op Procedure(s) (LRB): RIGHT KNEE IRRIGATION AND DEBRIDEMENT with POLY EXCHANGE and PLACEMENT of STIMULAN BEADS (Right) Application Of Wound Vac (Right) Patient reports pain as mild and moderate.  Cultures showing pseudomonas  Objective: Vital signs in last 24 hours: Temp:  [97.5 F (36.4 C)-98.4 F (36.9 C)] 98.4 F (36.9 C) (02/01 0817) Pulse Rate:  [84-102] 84 (02/01 0817) Resp:  [16-18] 17 (02/01 0817) BP: (128-154)/(63-81) 128/63 (02/01 0817) SpO2:  [94 %-96 %] 96 % (02/01 0817) Weight:  [77 kg] 77 kg (02/01 1147)  Intake/Output from previous day: 01/31 0701 - 02/01 0700 In: 1780 [P.O.:480; I.V.:800; IV Piggyback:500] Out: 2350 [Urine:1450; Drains:900] Intake/Output this shift: No intake/output data recorded.  Recent Labs    07/30/19 1140 08/01/19 0145  HGB 12.0* 10.1*   Recent Labs    07/30/19 1140 08/01/19 0145  WBC 7.6 10.2  RBC 3.95* 3.33*  HCT 36.9* 30.1*  PLT 242 251   Recent Labs    07/30/19 1140 08/01/19 0145  NA 133* 132*  K 3.4* 4.1  CL 99 98  CO2 24 23  BUN 17 17  CREATININE 1.24 0.95  GLUCOSE 129* 142*  CALCIUM 9.1 9.2   No results for input(s): LABPT, INR in the last 72 hours.  Neurovascular intact Sensation intact distally Intact pulses distally Dorsiflexion/Plantar flexion intact Incision: wound vac in place and functioning    Assessment/Plan: 1 Day Post-Op Procedure(s) (LRB): RIGHT KNEE IRRIGATION AND DEBRIDEMENT with POLY EXCHANGE and PLACEMENT of STIMULAN BEADS (Right) Application Of Wound Vac (Right) Continue wound vac treatment Pain control as ordered Aspirin for dvt proph IV abx per ID  Planning potential surgery with plastics for wound coverage later this week   Margart Sickles 08/01/2019, 1:35 PM

## 2019-08-01 NOTE — Evaluation (Signed)
Physical Therapy Evaluation Patient Details Name: Devin [name] MRN: [id number] DOB: 1936-06-26 Today's Date: 08/01/2019   History of Present Illness  84 yo admitted with Rt TKA necrosis with pyarthrosis s/p I&D with TKA revision. PMhx: Rt TKA 06/03/19, HTN, Covid 19 05/2019  Clinical Impression  Pt asking for pain medicine on arrival unaware that he had already received it this morning. Pt with good strength and function with education for [facility] and maintaining KI at all times. Pt reports family can assist at home but no one present to confirm. Pt with decreased ROM, transfers and gait limited by pain who will benefit from acute therapy to maximize mobility, safety and function to decrease burden of care.      Follow Up Recommendations Home health PT;Supervision/Assistance - 24 hour    Equipment Recommendations  None recommended by PT    Recommendations for Other Services OT consult     Precautions / Restrictions Precautions Precautions: Knee;Fall Precaution Comments: VAC Required Braces or Orthoses: Knee Immobilizer - Right Knee Immobilizer - Right: On at all times Restrictions Weight Bearing Restrictions: Yes RLE Weight Bearing: Weight bearing as tolerated      Mobility  Bed Mobility Overal bed mobility: Needs Assistance Bed Mobility: Supine to Sit     Supine to sit: Min guard     General bed mobility comments: guarding for lines and safety  Transfers Overall transfer level: Needs assistance   Transfers: Sit to/from Stand Sit to Stand: Min guard         General transfer comment: cues for hand placement and safety  Ambulation/Gait Ambulation/Gait assistance: Min guard Gait Distance (Feet): 70 Feet Assistive device: Rolling walker (2 wheeled) Gait Pattern/deviations: Step-to pattern;Decreased stride length;Trunk flexed   Gait velocity interpretation: 1.31 - 2.62 ft/sec, indicative of limited community ambulator General Gait Details: cues for safety,  posture and sequence, RLE KI on throughout so gait deviations due to KI and pain  Stairs            Wheelchair Mobility    Modified Rankin (Stroke Patients Only)       Balance Overall balance assessment: Needs assistance Sitting-balance support: Feet supported Sitting balance-Leahy Scale: Fair     Standing balance support: Bilateral upper extremity supported Standing balance-Leahy Scale: Poor Standing balance comment: reliance on bil UE and RW                             Pertinent Vitals/Pain Pain Assessment: 0-10 Pain Score: 5  Pain Location: right knee Pain Descriptors / Indicators: Aching;Sore Pain Intervention(s): Limited activity within patient's tolerance;Monitored during session;Premedicated before session;Repositioned    Home Living Family/patient expects to be discharged to:: Private residence Living Arrangements: Alone Available Help at Discharge: Family;Available 24 hours/day Type of Home: House Home Access: Stairs to enter   [employer] of Steps: 1 Home Layout: Two level;Able to live on main level with bedroom/bathroom Home Equipment: Dan Humphreys - 2 wheels;Cane - single point;Bedside commode      Prior Function Level of Independence: Independent with assistive device(s)         Comments: pt states he uses RW or cane, children assist him     Hand Dominance        Extremity/Trunk Assessment   Upper Extremity Assessment Upper Extremity Assessment: Overall WFL for tasks assessed    Lower Extremity Assessment Lower Extremity Assessment: RLE deficits/detail RLE Deficits / Details: pt with KI on at all times so ROM  unable to be assessed    Cervical / Trunk Assessment Cervical / Trunk Assessment: Kyphotic  Communication   Communication: HOH  Cognition Arousal/Alertness: Awake/alert Behavior During Therapy: Impulsive Overall Cognitive Status: Impaired/Different from baseline Area of Impairment:  Orientation;Safety/judgement                 Orientation Level: Time       Safety/Judgement: Decreased awareness of deficits;Decreased awareness of safety            General Comments      Exercises General Exercises - Lower Extremity Hip ABduction/ADduction: AROM;Right;Seated;10 reps Straight Leg Raises: AROM;Right;Seated;10 reps   Assessment/Plan    PT Assessment Patient needs continued PT services  PT Problem List Decreased strength;Decreased mobility;Decreased activity tolerance;Decreased balance;Decreased safety awareness;Decreased knowledge of precautions;Decreased cognition;Decreased knowledge of use of DME;Pain       PT Treatment Interventions DME instruction;Gait training;Stair training;Functional mobility training;Therapeutic activities;Patient/family education;Cognitive remediation;Therapeutic exercise    PT Goals (Current goals can be found in the Care Plan section)  Acute Rehab PT Goals Patient Stated Goal: go home PT Goal Formulation: With patient Time For Goal Achievement: 08/15/19 Potential to Achieve Goals: Fair    Frequency Min 5X/week   Barriers to discharge Decreased caregiver support pt lives alone but reports kids can assist 24hr, no family present to confirm    Co-evaluation               AM-PAC PT "6 Clicks" Mobility  Outcome Measure Help needed turning from your back to your side while in a flat bed without using bedrails?: A Little Help needed moving from lying on your back to sitting on the side of a flat bed without using bedrails?: A Little Help needed moving to and from a bed to a chair (including a wheelchair)?: A Little Help needed standing up from a chair using your arms (e.g., wheelchair or bedside chair)?: A Little Help needed to walk in hospital room?: A Little Help needed climbing 3-5 steps with a railing? : A Little 6 Click Score: 18    End of Session Equipment Utilized During Treatment: Gait belt;Right knee  immobilizer Activity Tolerance: Patient tolerated treatment well Patient left: in chair;with call bell/phone within reach;with chair alarm set Nurse Communication: Mobility status;Precautions;Weight bearing status PT Visit Diagnosis: Other abnormalities of gait and mobility (R26.89);Muscle weakness (generalized) (M62.81)    Time: 2706-2376 PT Time Calculation (min) (ACUTE ONLY): 25 min   Charges:   PT Evaluation $PT Eval Moderate Complexity: 1 Mod          Zaria Taha P, PT Acute Rehabilitation Services Pager: [phone] Office: [phone]   Sandy Salaam Nickalas Mccarrick 08/01/2019, 12:27 PM

## 2019-08-01 NOTE — Progress Notes (Signed)
Patient ID: Devin [name], male   DOB: 01-06-1936, 84 y.o.   MRN: [id number] Postoperative day 1 debridement infected right total knee arthroplasty.  Tissue cultures are positive for gram positive cocci and gram-negative rods.  The installation wound VAC is functioning well.  Anticipate gastrocnemius rotation flap later this week.

## 2019-08-01 NOTE — Op Note (Signed)
NAME: Devin [name], ROUT MEDICAL RECORD XT:[id number] ACCOUNT [id number] DATE OF BIRTH:May 12, 1936 FACILITY: [facility] LOCATION: [id number] PHYSICIAN:W. Zackaria Burkey JR., MD  OPERATIVE REPORT  DATE OF PROCEDURE:  07/30/2019  PREOPERATIVE DIAGNOSES:   1.  Pyarthrosis, right knee, status post right total knee. 2.  Wound necrosis, anteroinferior wound.  POSTOPERATIVE DIAGNOSES:   1.  Pyarthrosis, right knee, status post right total knee. 2.  Wound necrosis, anteroinferior wound.  OPERATION: 1.  Incision and drainage of knee with arthrotomy, right knee. 2.  Revision with polyethylene exchange, tibial polyethylene (Attune size 6 femur, 5 mm bearing).   3.  Tissue transfer greater than 30 cm.   4.  Antibiotic bead placement, right knee. 5.  Application of VAC suction drainage.    SURGEON:  Thelma Barge., MD  ASSISTANT:   Meridee Score, MD   TOURNIQUET:  No tourniquet.    ANESTHESIA:   General anesthetic.  DESCRIPTION OF PROCEDURE:  This patient presented status post total knee approximately 2 months ago with wound necrosis and probable pyarthrosis.  In the emergency room setting, he had a fluid aspirated of about 30 mL, white count approaching 30,000.   Clinical picture was that of a pyarthrosis complicated by wound necrosis of unclear etiology in the inferior aspect of his wound with visible patellar tendon.  It was felt that this would require a staged procedure with poly exchange, aggressive  debridement, followed by possible gastroc flap.  We had had plastic surgery see this patient in the office setting within the last week in anticipation of that.  We enlarged the original longitudinal incision to minimize any trauma to the soft tissue.   There was obvious central wound necrosis with nonviable patellar tendon and even some nonviable patella and superficial necrosis of the patella itself.  The inferior one-fourth of the original incision was where the skin necrosis had occurred, leaving   the nonviable tissue.  We did an extended arthrotomy, extending the incision inferiorly and superiorly, undermining the skin extensively and the subcutaneous tissues.  There was a significant reactive synovitis in the knee.  The components did not appear  to be loose.  We did remove the polyethylene and after copious irrigation with pulsatile lavage, did a polyethylene exchange.  Aggressive debridement of all the nonviable tissue relative to the active synovitis from the probable infection as well as  some debridement of particularly the medial aspect of the patellar tendon.  Estimated probably about a third of the patellar tendon had to be debrided due to its nonviability and probable gross infection.  Multiple tissue cultures from the surgery were  sent for fluid, but as well as from some of the soft tissue as well as a representative sample of bone from the proximal tibia.  Extensive undermining of the skin was done to allow local tissue transfer to allow partial closure.  The arthrotomy, which  was only partially closable due to the defect created around the polyethylene component.  We did close the superior two-thirds of the incision through extensive tissue mobilization as well.  Prior to closure, we did place Stimulan beads with vancomycin  and gentamicin into the arthrotomy site.  There was a significant defect relative to having to debride some of the patellar tendon relative to having exposed polyethylene.  We then placed a VAC drain on top of the component as well as in the entire  length of the incision, did not attempt skin closure.  A VAC dressing was applied with a knee immobilizer.  He was taken to recovery room in stable condition.  VN/NUANCE  D:07/31/2019 T:07/31/2019 JOB:[id number]

## 2019-08-01 NOTE — Evaluation (Signed)
Occupational Therapy Evaluation Patient Details Name: Devin [name] MRN: [id number] DOB: [birth year] Today's Date: 08/01/2019    History of Present Illness 84 yo admitted with Rt TKA necrosis with pyarthrosis s/p I&D with TKA revision. PMhx: Rt TKA 06/03/19, HTN, Covid 19 05/2019   Clinical Impression   Pt with decline in function and safety with ADLs and ADL mobility with impiard balance and endurance. Pt also with Poor safety awareness. Pt lives at home aline in [place] , [place] and prior to original knee surgery, pt was independent with ADLs/selfcare, home mgt, mobility and was driving. Pt currently requires mod A with LB ADLs, min A with toileting and min guard A with mobility using RW, verbal cues for safety. Pt would benefit from acute OT services to address impairments to maximize level of function and safety to return home    Follow Up Recommendations  Home health OT    Equipment Recommendations  Other (comment)(reacher)    Recommendations for Other Services       Precautions / Restrictions Precautions Precautions: Knee;Fall Precaution Comments: VAC Required Braces or Orthoses: Knee Immobilizer - Right Knee Immobilizer - Right: On at all times Restrictions Weight Bearing Restrictions: Yes RLE Weight Bearing: Weight bearing as tolerated      Mobility Bed Mobility Overal bed mobility: Needs Assistance Bed Mobility: Sit to Supine     Supine to sit: Min guard Sit to supine: Min guard   General bed mobility comments: pt in recliner upon arrival, Assisted pt back to bed at end of session  Transfers Overall transfer level: Needs assistance   Transfers: Sit to/from Stand Sit to Stand: Min guard         General transfer comment: cues for hand placement and safety    Balance Overall balance assessment: Needs assistance Sitting-balance support: Feet supported Sitting balance-Leahy Scale: Fair     Standing balance support: Bilateral upper extremity  supported;During functional activity Standing balance-Leahy Scale: Poor Standing balance comment: reliance on bil UE and RW                           ADL either performed or assessed with clinical judgement   ADL Overall ADL's : Needs assistance/impaired Eating/Feeding: Set up;Independent;Sitting   Grooming: Wash/dry hands;Wash/dry face;Min guard;Standing   Upper Body Bathing: Set up;Sitting   Lower Body Bathing: Moderate assistance   Upper Body Dressing : Set up;Sitting   Lower Body Dressing: Moderate assistance   Toilet Transfer: Min guard;Ambulation;RW;Cueing for safety   Toileting- Clothing Manipulation and Hygiene: Minimal assistance;Sit to/from stand       Functional mobility during ADLs: Min guard;Rolling walker;Cueing for safety       Vision Baseline Vision/History: Wears glasses Patient Visual Report: No change from baseline       Perception     Praxis      Pertinent Vitals/Pain Pain Assessment: 0-10 Pain Score: 4  Pain Location: right knee Pain Descriptors / Indicators: Aching;Sore Pain Intervention(s): Limited activity within patient's tolerance;Monitored during session;Premedicated before session;Repositioned     Hand Dominance Left   Extremity/Trunk Assessment Upper Extremity Assessment Upper Extremity Assessment: Overall WFL for tasks assessed   Lower Extremity Assessment Lower Extremity Assessment: Defer to PT evaluation RLE Deficits / Details: pt with KI on at all times so ROM unable to be assessed   Cervical / Trunk Assessment Cervical / Trunk Assessment: Kyphotic   Communication Communication Communication: HOH   Cognition Arousal/Alertness: Awake/alert Behavior During Therapy: Impulsive Overall  Cognitive Status: Impaired/Different from baseline Area of Impairment: Orientation;Safety/judgement;Problem solving                 Orientation Level: Time       Safety/Judgement: Decreased awareness of  deficits;Decreased awareness of safety   Problem Solving: Requires verbal cues     General Comments       Exercises General Exercises - Lower Extremity Hip ABduction/ADduction: AROM;Right;Seated;10 reps Straight Leg Raises: AROM;Right;Seated;10 reps   Shoulder Instructions      Home Living Family/patient expects to be discharged to:: Private residence Living Arrangements: Alone Available Help at Discharge: Family;Available 24 hours/day Type of Home: House Home Access: Stairs to enter [employer] of Steps: 1 Entrance Stairs-Rails: None Home Layout: Two level;Able to live on main level with bedroom/bathroom Alternate Level Stairs-Number of Steps: pt has a chair lift for stairs to basement (he got it for his dogs to ride up/down the stairs because they typically stay downstairs and have trouble getting up the stairs)   Bathroom Shower/Tub: Occupational psychologist: Standard Bathroom Accessibility: Yes   Home Equipment: Environmental consultant - 2 wheels;Cane - single point;Bedside commode          Prior Functioning/Environment Level of Independence: Independent with assistive device(s)        Comments: pt states he uses RW or cane, children assist him        OT Problem List: Impaired balance (sitting and/or standing);Decreased cognition;Pain;Decreased safety awareness;Decreased activity tolerance;Decreased knowledge of use of DME or AE      OT Treatment/Interventions: Self-care/ADL training;DME and/or AE instruction;Therapeutic activities;Patient/family education    OT Goals(Current goals can be found in the care plan section) Acute Rehab OT Goals Patient Stated Goal: go home OT Goal Formulation: With patient Time For Goal Achievement: 08/15/19 Potential to Achieve Goals: Good ADL Goals Pt Will Perform Grooming: with set-up;with supervision;with modified independence;standing Pt Will Perform Lower Body Bathing: with min assist;with min guard assist Pt Will  Perform Lower Body Dressing: with min assist;with min guard assist Pt Will Transfer to Toilet: with supervision;with modified independence;ambulating Pt Will Perform Toileting - Clothing Manipulation and hygiene: with min guard assist;with supervision;sit to/from stand  OT Frequency: Min 2X/week   Barriers to D/C:    no barriers       Co-evaluation              AM-PAC OT "6 Clicks" Daily Activity     Outcome Measure Help from another person eating meals?: None Help from another person taking care of personal grooming?: A Little Help from another person toileting, which includes using toliet, bedpan, or urinal?: A Little Help from another person bathing (including washing, rinsing, drying)?: A Lot Help from another person to put on and taking off regular upper body clothing?: None Help from another person to put on and taking off regular lower body clothing?: A Lot 6 Click Score: 18   End of Session Equipment Utilized During Treatment: Gait belt;Rolling walker  Activity Tolerance: Patient tolerated treatment well Patient left: in bed;with call bell/phone within reach;with bed alarm set  OT Visit Diagnosis: Other abnormalities of gait and mobility (R26.89);Pain Pain - Right/Left: Right Pain - part of body: Knee;Leg                Time: 0160-1093 OT Time Calculation (min): 27 min Charges:  OT Evaluation $OT Eval Moderate Complexity: 1 Mod OT Treatments $Self Care/Home Management : 8-22 mins    Britt Bottom 08/01/2019, 1:17 PM

## 2019-08-01 NOTE — Progress Notes (Signed)
Initial Nutrition Assessment  RD working remotely.  DOCUMENTATION CODES:   Not applicable  INTERVENTION:   -Ensure Enlive po TID, each supplement provides 350 kcal and 20 grams of protein -MVI with minerals daily  NUTRITION DIAGNOSIS:   Increased nutrient needs related to post-op healing as evidenced by estimated needs.  GOAL:   Patient will meet greater than or equal to 90% of their needs  MONITOR:   PO intake, Supplement acceptance, Labs, Weight trends, Skin, I & O's  REASON FOR ASSESSMENT:   Consult Assessment of nutrition requirement/status  ASSESSMENT:   Patient is s/p RTK 06/03/2019 with Dr. French Ana, he developed some skin issues acutely following surgery within the first week which in tern led to some wound necrosis and dehisence since.  He was initially treated with some po doxycycline but not currently on abx.  Since surgery postop period also complicated by hospital admission closer to his home in [place] for positive covid and pneumonia.  Family member states he has also been seen for dehydration.  They were consulted by plastic surgery 2 days ago who mentioned possiblity of skin graft pending no deep infection.  Up to this point wound has not appeared infected despite the dehisence which has seemed to progress over the last month.  We did do a knee aspiration on 06/10/19 which showed total cell count of 12,600, negative culture, no crystals.  Denies fevers, chills, sweats, they do note some wound drainage which has been persistent but not increasing.  Family member called ortho on call this AM mentioning increased right knee pain, inability to bear weight, not eating, instructed to come to [facility] for admission under Dr. French Ana and likely plan for surgery.  Pt admitted with rt knee s/p TKA with wound dehiscence with concern for joint infection.   1/31- s/p I&D of rt knee, revision of polyethylene exchange, tissue transfer, antibiotic bead placement, and application of wound  vac  Reviewed I/O's: -570 ml x 24 hours and -451 ml since admission  UOP: 1.5 L x 24 hours   Drain output: 900 ml x 24 hours  Attempted to speak with pt via phone, however, no answer. RD unable to obtain further nutrition-related history at this time.   Per H&P, pt with poor oral intake PTA. Per meal completion records, noted meal intake 50%.   Reviewed wt hx; noted pt has experienced a 4.1% wt loss over the past 2 months, which is not significant for time frame.   Per orthopedics notes, plan for gastrocnemius rotation flap later on this week.   Given decreased oral intake and increased nutritional needs for post-operative healing, pt would greatly benefit from addition of oral nutrition supplements.   Medications reviewed and include colace.   Labs reviewed: Na: 132.  Diet Order:   Diet Order            Diet regular Room service appropriate? Yes with Assist; Fluid consistency: Thin  Diet effective now              EDUCATION NEEDS:   No education needs have been identified at this time  Skin:  Skin Assessment: Skin Integrity Issues: Skin Integrity Issues:: Wound VAC Wound Vac: rt knee  Last BM:  Unknown  Height:   Ht Readings from Last 1 Encounters:  08/01/19 5\' 6"  (1.676 m)    Weight:   Wt Readings from Last 1 Encounters:  08/01/19 77 kg    Ideal Body Weight:  64.5 kg  BMI:  Body mass  index is 27.4 kg/m.  Estimated Nutritional Needs:   Kcal:  2050-2250  Protein:  100-115 grams  Fluid:  > 2 L    Latania Bascomb A. Mayford Knife, RD, LDN, CDCES Registered Dietitian II Certified Diabetes Care and Education Specialist Pager: [phone] After hours Pager: [phone]

## 2019-08-02 ENCOUNTER — Encounter (HOSPITAL_COMMUNITY): Payer: Self-pay | Admitting: Orthopedic Surgery

## 2019-08-02 ENCOUNTER — Inpatient Hospital Stay: Payer: Self-pay

## 2019-08-02 DIAGNOSIS — M25561 Pain in right knee: Secondary | ICD-10-CM

## 2019-08-02 DIAGNOSIS — Z96651 Presence of right artificial knee joint: Secondary | ICD-10-CM

## 2019-08-02 DIAGNOSIS — L089 Local infection of the skin and subcutaneous tissue, unspecified: Secondary | ICD-10-CM

## 2019-08-02 LAB — BASIC METABOLIC PANEL
Anion gap: 9 (ref 5–15)
BUN: 19 mg/dL (ref 8–23)
CO2: 23 mmol/L (ref 22–32)
Calcium: 9.3 mg/dL (ref 8.9–10.3)
Chloride: 100 mmol/L (ref 98–111)
Creatinine, Ser: 1.14 mg/dL (ref 0.61–1.24)
GFR calc Af Amer: >60 mL/min (ref >60)
GFR calc non Af Amer: 60 mL/min — ABNORMAL LOW (ref >60)
Glucose, Bld: 117 mg/dL — ABNORMAL HIGH (ref 70–99)
Potassium: 3.7 mmol/L (ref 3.5–5.1)
Sodium: 132 mmol/L — ABNORMAL LOW (ref 135–145)

## 2019-08-02 LAB — CBC
HCT: 27.3 % — ABNORMAL LOW (ref 39.0–52.0)
Hemoglobin: 9.2 g/dL — ABNORMAL LOW (ref 13.0–17.0)
MCH: 30.6 pg (ref 26.0–34.0)
MCHC: 33.7 g/dL (ref 30.0–36.0)
MCV: 90.7 fL (ref 80.0–100.0)
Platelets: 238 10*3/uL (ref 150–400)
RBC: 3.01 MIL/uL — ABNORMAL LOW (ref 4.22–5.81)
RDW: 13.7 % (ref 11.5–15.5)
WBC: 6.7 10*3/uL (ref 4.0–10.5)
nRBC: 0 % (ref 0.0–0.2)

## 2019-08-02 LAB — AEROBIC CULTURE W GRAM STAIN (SUPERFICIAL SPECIMEN): Gram Stain: NONE SEEN

## 2019-08-02 LAB — BODY FLUID CULTURE: Culture: NO GROWTH

## 2019-08-02 LAB — HEPATITIS C ANTIBODY: HCV Ab: NONREACTIVE

## 2019-08-02 LAB — SEDIMENTATION RATE: Sed Rate: 45 mm/hr — ABNORMAL HIGH (ref 0–16)

## 2019-08-02 LAB — C-REACTIVE PROTEIN: CRP: 9.6 mg/dL — ABNORMAL HIGH (ref <1.0)

## 2019-08-02 LAB — HIV ANTIBODY (ROUTINE TESTING W REFLEX): HIV Screen 4th Generation wRfx: NONREACTIVE — AB

## 2019-08-02 MED ORDER — CHLORHEXIDINE GLUCONATE CLOTH 2 % EX PADS
6.0000 | MEDICATED_PAD | Freq: Every day | CUTANEOUS | Status: DC
  Administered 2019-08-04 – 2019-08-11 (×7): 6 via TOPICAL

## 2019-08-02 MED ORDER — SODIUM CHLORIDE 0.9% FLUSH: 10.0000 mL | INTRAVENOUS | Status: DC | PRN

## 2019-08-02 MED ORDER — CHLORHEXIDINE GLUCONATE CLOTH 2 % EX PADS: 6.0000 | MEDICATED_PAD | Freq: Every day | CUTANEOUS | Status: DC

## 2019-08-02 NOTE — H&P (View-Only) (Signed)
Reason for Consult/CC: Infected right total knee arthroplasty  Devin [name] is an 84 y.o. male.  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  I did seen him preoperatively in the office anticipation of the need for coverage of this issue.  He is since had a debridement by Dr. Madelon Lips and his cultures are growing out Pseudomonas.  I am consulted for assistance with soft tissue coverage over the knee prosthesis and joint.  Patient is overall doing well and is nontoxic.  He is not having a lot of pain at the moment and actually wants to get out of the hospital soon as possible  Allergies: No Known Allergies  Medications:  Current Facility-Administered Medications:  .  0.9 %  sodium chloride infusion, , Intravenous, Continuous, Chadwell, Joshua, PA-C, Last Rate: 75 mL/hr at 08/02/19 1029, New Bag at 08/02/19 1029 .  acetaminophen (TYLENOL) tablet 650 mg, 650 mg, Oral, Q4H PRN, Chadwell, Joshua, PA-C, 650 mg at 08/01/19 0940 .  aspirin chewable tablet 81 mg, 81 mg, Oral, BID, Chadwell, Joshua, PA-C, 81 mg at 08/02/19 1026 .  ceFEPIme (MAXIPIME) 2 g in sodium chloride 0.9 % 100 mL IVPB, 2 g, Intravenous, Q12H, Frederico Hamman, MD, Last Rate: 200 mL/hr at 08/02/19 0602, 2 g at 08/02/19 0602 .  Chlorhexidine Gluconate Cloth 2 % PADS 6 each, 6 each, Topical, Daily, Frederico Hamman, MD .  Chlorhexidine Gluconate Cloth 2 % PADS 6 each, 6 each, Topical, Daily, Frederico Hamman, MD .  diphenhydrAMINE (BENADRYL) 12.5 MG/5ML elixir 12.5-25 mg, 12.5-25 mg, Oral, Q4H PRN, Chadwell, Joshua, PA-C .  docusate sodium (COLACE) capsule 100 mg, 100 mg, Oral, BID, Chadwell, Joshua, PA-C, 100 mg at 08/02/19 1026 .  feeding supplement (ENSURE ENLIVE) (ENSURE ENLIVE) liquid 237 mL, 237 mL, Oral, TID BM, Frederico Hamman, MD, 237 mL at 08/02/19 1341 .  HYDROmorphone (DILAUDID) injection 0.5 mg, 0.5 mg, Intravenous, Q4H PRN, Chadwell, Joshua, PA-C, 0.5 mg at 07/31/19  1808 .  labetalol (NORMODYNE) tablet 300 mg, 300 mg, Oral, BID, Chadwell, Joshua, PA-C, 300 mg at 08/02/19 1026 .  losartan (COZAAR) tablet 100 mg, 100 mg, Oral, Daily, Chadwell, Joshua, PA-C, 100 mg at 08/02/19 1026 .  menthol-cetylpyridinium (CEPACOL) lozenge 3 mg, 1 lozenge, Oral, PRN **OR** phenol (CHLORASEPTIC) mouth spray 1 spray, 1 spray, Mouth/Throat, PRN, Chadwell, Joshua, PA-C .  metoCLOPramide (REGLAN) tablet 5-10 mg, 5-10 mg, Oral, Q8H PRN **OR** metoCLOPramide (REGLAN) injection 5-10 mg, 5-10 mg, Intravenous, Q8H PRN, Chadwell, Joshua, PA-C .  multivitamin with minerals tablet 1 tablet, 1 tablet, Oral, Daily, Frederico Hamman, MD, 1 tablet at 08/02/19 1026 .  ondansetron (ZOFRAN) tablet 4 mg, 4 mg, Oral, Q6H PRN **OR** ondansetron (ZOFRAN) injection 4 mg, 4 mg, Intravenous, Q6H PRN, Chadwell, Joshua, PA-C .  oxyCODONE (Oxy IR/ROXICODONE) immediate release tablet 5-10 mg, 5-10 mg, Oral, Q4H PRN, Chadwell, Joshua, PA-C, 10 mg at 08/02/19 1026 .  senna-docusate (Senokot-S) tablet 1 tablet, 1 tablet, Oral, QHS PRN, Chadwell, Joshua, PA-C .  sodium chloride flush (NS) 0.9 % injection 10-40 mL, 10-40 mL, Intracatheter, PRN, Frederico Hamman, MD .  sodium phosphate (FLEET) 7-19 GM/118ML enema 1 enema, 1 enema, Rectal, Once PRN, Chadwell, Joshua, PA-C .  sorbitol 70 % solution 30 mL, 30 mL, Oral, Daily PRN, Margart Sickles, PA-C  Past Medical History:  Diagnosis Date  . Hypertension   . Hypertension     Past Surgical History:  Procedure Laterality Date  . APPLICATION OF WOUND VAC Right 07/31/2019  Procedure: Application Of Wound Vac;  Surgeon: Earlie Server, MD;  Location: [facility];  Service: Orthopedics;  Laterality: Right;  . CLOSED REDUCTION FEMORAL SHAFT FRACTURE Left   . TOTAL KNEE ARTHROPLASTY Right 06/03/2019   Procedure: TOTAL KNEE ARTHROPLASTY, ORIF MEDIAL CONDYLE, LATERAL RELEASE;  Surgeon: Earlie Server, MD;  Location: [facility];  Service: Orthopedics;  Laterality: Right;  .  TOTAL KNEE REVISION Right 07/31/2019   Procedure: RIGHT KNEE IRRIGATION AND DEBRIDEMENT with POLY EXCHANGE and PLACEMENT of STIMULAN BEADS;  Surgeon: Earlie Server, MD;  Location: [facility];  Service: Orthopedics;  Laterality: Right;    Family History  Problem Relation Age of Onset  . Stroke Mother     Social History:  reports that he has never smoked. He has never used smokeless tobacco. He reports that he does not drink alcohol or use drugs.   Blood pressure 126/90, pulse 85, temperature 98.6 F (37 C), temperature source Oral, resp. rate 17, height 5\' 6"  (1.676 m), weight 77 kg, SpO2 95 %.  Physical exam His right knee is immobilized with a wound VAC in place.  He has good sensation and movement in his foot.   Results for orders placed or performed during the hospital encounter of 07/30/19 (from the past 48 hour(s))  CBC     Status: Abnormal   Collection Time: 08/01/19  1:45 AM  Result Value Ref Range   WBC 10.2 4.0 - 10.5 K/uL   RBC 3.33 (L) 4.22 - 5.81 MIL/uL   Hemoglobin 10.1 (L) 13.0 - 17.0 g/dL   HCT 30.1 (L) 39.0 - 52.0 %   MCV 90.4 80.0 - 100.0 fL   MCH 30.3 26.0 - 34.0 pg   MCHC 33.6 30.0 - 36.0 g/dL   RDW 13.3 11.5 - 15.5 %   Platelets 251 150 - 400 K/uL   nRBC 0.0 0.0 - 0.2 %    Comment: Performed at [facility], [place] [address] [place], [place] [address]  Basic metabolic panel     Status: Abnormal   Collection Time: 08/01/19  1:45 AM  Result Value Ref Range   Sodium 132 (L) 135 - 145 mmol/L   Potassium 4.1 3.5 - 5.1 mmol/L   Chloride 98 98 - 111 mmol/L   CO2 23 22 - 32 mmol/L   Glucose, Bld 142 (H) 70 - 99 mg/dL   BUN 17 8 - 23 mg/dL   Creatinine, Ser 0.95 0.61 - 1.24 mg/dL   Calcium 9.2 8.9 - 10.3 mg/dL   GFR calc non Af Amer >60 >60 mL/min   GFR calc Af Amer >60 >60 mL/min   Anion gap 11 5 - 15    Comment: Performed at [facility] [address] [place], [place] [address]  CBC     Status: Abnormal   Collection Time: 08/02/19  4:29 AM   Result Value Ref Range   WBC 6.7 4.0 - 10.5 K/uL   RBC 3.01 (L) 4.22 - 5.81 MIL/uL   Hemoglobin 9.2 (L) 13.0 - 17.0 g/dL   HCT 27.3 (L) 39.0 - 52.0 %   MCV 90.7 80.0 - 100.0 fL   MCH 30.6 26.0 - 34.0 pg   MCHC 33.7 30.0 - 36.0 g/dL   RDW 13.7 11.5 - 15.5 %   Platelets 238 150 - 400 K/uL   nRBC 0.0 0.0 - 0.2 %    Comment: Performed at [facility], [place] [address], [place], [place] [address]  Sedimentation rate  Status: Abnormal   Collection Time: 08/02/19  4:29 AM  Result Value Ref Range   Sed Rate 45 (H) 0 - 16 mm/hr    Comment: Performed at [facility], [address] [place], [place] [address]  C-reactive protein     Status: Abnormal   Collection Time: 08/02/19  4:29 AM  Result Value Ref Range   CRP 9.6 (H) <1.0 mg/dL    Comment: Performed at [facility], [address] [place], [place] [address]  Hepatitis C antibody     Status: None   Collection Time: 08/02/19  4:29 AM  Result Value Ref Range   HCV Ab NON REACTIVE NON REACTIVE    Comment: (NOTE) Nonreactive HCV antibody screen is consistent with no HCV infections,  unless recent infection is suspected or other evidence exists to indicate HCV infection. Performed at [facility], [address] [place], [place] [address]   Basic metabolic panel     Status: Abnormal   Collection Time: 08/02/19 10:40 AM  Result Value Ref Range   Sodium 132 (L) 135 - 145 mmol/L   Potassium 3.7 3.5 - 5.1 mmol/L   Chloride 100 98 - 111 mmol/L   CO2 23 22 - 32 mmol/L   Glucose, Bld 117 (H) 70 - 99 mg/dL   BUN 19 8 - 23 mg/dL   Creatinine, Ser 1.14 0.61 - 1.24 mg/dL   Calcium 9.3 8.9 - 10.3 mg/dL   GFR calc non Af Amer 60 (L) >60 mL/min   GFR calc Af Amer >60 >60 mL/min   Anion gap 9 5 - 15    Comment: Performed at [facility], [address] ,  [address]    US EKG SITE RITE  Result Date: 08/02/2019 If Site Rite image not attached, placement could not be confirmed due to current  cardiac rhythm.   Assessment/Plan: Patient presents with an infected right total knee arthroplasty status post debridement with cultures growing out Pseudomonas.  I had a number of detailed conversations with Dr. Caffrey about how to proceed.  We discussed the pros and cons of the different ways to manage to the hardware in this setting.  He is consulted with multiple colleagues all of which agree that another polyethylene exchange and washout should be done followed by soft tissue coverage.  We have discussed the potential for postoperative infection with this plan.  However there is also a significant risk of morbidity if the primary components of the arthroplasty are removed.  After weighing the risk and benefits I think it is reasonable to proceed with rotation of the medial head of the gastroc with a split-thickness skin graft later this week.  I explained this procedure in detail to the patient and his daughter-in-law.  We discussed the risk that include bleeding, infection, damage to surrounding structures including nerves, need for additional procedures.  We discussed the potential for the blood supply to the flap to be suboptimal for a variety of reasons and that this could result in flap failure.  I explained that I do not believe he will have much of a functional deficit after he is healed as the remaining muscles in place will be able to serve that function for him.  I discussed with the patient and Dr. Caffrey that should the infection recur the patient may require soft tissue coverage procedures that are beyond my capability to perform in this setting.  If that were to be the   case he would require transfer to a tertiary care facility.  Postoperatively the patient should be toe-touch weightbearing to the right leg and spent most of his time with the leg elevated for the first 2 weeks.  Allena Napoleon 08/02/2019, 2:24 PM

## 2019-08-02 NOTE — Plan of Care (Signed)

## 2019-08-02 NOTE — Progress Notes (Signed)
Peripherally Inserted Central Catheter/Midline Placement  The IV Nurse has discussed with the patient and/or persons authorized to consent for the patient, the purpose of this procedure and the potential benefits and risks involved with this procedure.  The benefits include less needle sticks, lab draws from the catheter, and the patient may be discharged home with the catheter. Risks include, but not limited to, infection, bleeding, blood clot (thrombus formation), and puncture of an artery; nerve damage and irregular heartbeat and possibility to perform a PICC exchange if needed/ordered by physician.  Alternatives to this procedure were also discussed.  Bard Power PICC patient education guide, fact sheet on infection prevention and patient information card has been provided to patient /or left at bedside.    PICC/Midline Placement Documentation  PICC Single Lumen 08/02/19 PICC Right Basilic 38 cm 0 cm (Active)  Indication for Insertion or Continuance of Line Home intravenous therapies (PICC only) 08/02/19 1220  Exposed Catheter (cm) 0 cm 08/02/19 1220  Site Assessment Clean;Dry;Intact 08/02/19 1220  Line Status Flushed;Blood return noted;Saline locked 08/02/19 1220  Dressing Type Transparent 08/02/19 1220  Dressing Status Clean;Dry;Intact;Antimicrobial disc in place 08/02/19 1220  Dressing Change Due 08/09/19 08/02/19 1220       Audrie Gallus 08/02/2019, 12:23 PM

## 2019-08-02 NOTE — Progress Notes (Signed)
Orthopedic Tech Progress Note Patient Details:  Devin [name] [birth year] [id number]  Ortho Devices Type of Ortho Device: Knee Immobilizer Ortho Device/Splint Location: replacement   Post Interventions Instructions Provided: Care of device   Saul Fordyce 08/02/2019, 2:39 PM

## 2019-08-02 NOTE — Progress Notes (Signed)
Physical Therapy Treatment Patient Details Name: Devin [name] MRN: [id number] DOB: 01/22/1936 Today's Date: 08/02/2019    History of Present Illness 84 yo admitted with Rt TKA necrosis with pyarthrosis s/p I&D with TKA revision. 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 Pt with no awareness of why he needs KI or VAC. Pt educated for surgery and precautions as well as not medically ready for D/C yet. Pt with improved gait tolerance and continues to be limited by cognition and HOH. Will continue to follow.     Follow Up Recommendations  Home health PT;Supervision/Assistance - 24 hour     Equipment Recommendations  None recommended by PT    Recommendations for Other Services       Precautions / Restrictions Precautions Precautions: Knee;Fall Precaution Comments: VAC Required Braces or Orthoses: Knee Immobilizer - Right Knee Immobilizer - Right: On at all times Restrictions Weight Bearing Restrictions: Yes RLE Weight Bearing: Weight bearing as tolerated    Mobility  Bed Mobility Overal bed mobility: Needs Assistance Bed Mobility: Supine to Sit     Supine to sit: Min guard     General bed mobility comments: guarding for safety and cues  Transfers Overall transfer level: Needs assistance   Transfers: Sit to/from Stand Sit to Stand: Min guard         General transfer comment: cues for hand placement and safety  Ambulation/Gait Ambulation/Gait assistance: Min guard Gait Distance (Feet): 140 Feet Assistive device: Rolling walker (2 wheeled) Gait Pattern/deviations: Step-to pattern;Decreased stride length;Trunk flexed   Gait velocity interpretation: 1.31 - 2.62 ft/sec, indicative of limited community ambulator General Gait Details: cues for safety, posture and sequence, RLE KI on throughout so gait deviations due to KI and pain   Stairs             Wheelchair Mobility     Modified Rankin (Stroke Patients Only)       Balance Overall balance assessment: Needs assistance Sitting-balance support: Feet supported Sitting balance-Leahy Scale: Fair     Standing balance support: Bilateral upper extremity supported;During functional activity Standing balance-Leahy Scale: Poor Standing balance comment: reliance on bil UE and RW                            Cognition Arousal/Alertness: Awake/alert Behavior During Therapy: Flat affect Overall Cognitive Status: Impaired/Different from baseline Area of Impairment: Orientation;Safety/judgement;Problem solving                 Orientation Level: Time       Safety/Judgement: Decreased awareness of deficits;Decreased awareness of safety   Problem Solving: Requires verbal cues General Comments: pt stating "i'm ready to go home" pt unaware of pending future sx. pt stated month at "december" and when pt instructed on February stating "that's what I said"      Exercises General Exercises - Lower Extremity Hip ABduction/ADduction: AROM;Seated;15 reps;Both Straight Leg Raises: AROM;Seated;15 reps;Both    General Comments        Pertinent Vitals/Pain Pain Score: 4  Pain Location: right knee Pain Descriptors / Indicators: Aching;Sore;Guarding Pain Intervention(s): Limited activity within patient's tolerance;Monitored during session;Repositioned    Home Living                      Prior Function  PT Goals (current goals can now be found in the care plan section) Progress towards PT goals: Progressing toward goals    Frequency    Min 5X/week      PT Plan      Co-evaluation              AM-PAC PT "6 Clicks" Mobility   Outcome Measure  Help needed turning from your back to your side while in a flat bed without using bedrails?: A Little Help needed moving from lying on your back to sitting on the side of a flat bed without using bedrails?: A  Little Help needed moving to and from a bed to a chair (including a wheelchair)?: A Little Help needed standing up from a chair using your arms (e.g., wheelchair or bedside chair)?: A Little Help needed to walk in hospital room?: A Little Help needed climbing 3-5 steps with a railing? : A Little 6 Click Score: 18    End of Session Equipment Utilized During Treatment: Gait belt;Right knee immobilizer Activity Tolerance: Patient tolerated treatment well Patient left: in chair;with call bell/phone within reach;with chair alarm set Nurse Communication: Mobility status;Precautions;Weight bearing status PT Visit Diagnosis: Other abnormalities of gait and mobility (R26.89);Muscle weakness (generalized) (M62.81)     Time: 3785-8850 PT Time Calculation (min) (ACUTE ONLY): 23 min  Charges:  $Gait Training: 8-22 mins $Therapeutic Activity: 8-22 mins                     Lysander Calixte P, PT Acute Rehabilitation Services Pager: [phone] Office: [phone]    Avianah Pellman B Myleigh Amara 08/02/2019, 12:06 PM

## 2019-08-02 NOTE — Progress Notes (Signed)
Subjective:  " I need to get out of this chair and go to work!"  Antibiotics:  Anti-infectives (From admission, onward)   Start     Dose/Rate Route Frequency Ordered Stop   08/01/19 0800  vancomycin (VANCOCIN) IVPB 1000 mg/200 mL premix  Status:  Discontinued     1,000 mg 200 mL/hr over 60 Minutes Intravenous Every 24 hours 07/31/19 1131 08/01/19 1051   07/31/19 1345  ceFEPIme (MAXIPIME) 2 g in sodium chloride 0.9 % 100 mL IVPB     2 g 200 mL/hr over 30 Minutes Intravenous Every 12 hours 07/31/19 1336     07/31/19 1100  cefTRIAXone (ROCEPHIN) 2 g in sodium chloride 0.9 % 100 mL IVPB  Status:  Discontinued     2 g 200 mL/hr over 30 Minutes Intravenous Every 24 hours 07/31/19 1054 07/31/19 1329   07/31/19 0832  vancomycin (VANCOCIN) powder  Status:  Discontinued       As needed 07/31/19 0833 07/31/19 0915   07/31/19 0831  gentamicin (GARAMYCIN) injection  Status:  Discontinued       As needed 07/31/19 0832 07/31/19 0915   07/31/19 0815  vancomycin (VANCOREADY) IVPB 1500 mg/300 mL     1,500 mg 150 mL/hr over 120 Minutes Intravenous To Surgery 07/31/19 0807 07/31/19 1021      Medications: Scheduled Meds: . aspirin  81 mg Oral BID  . Chlorhexidine Gluconate Cloth  6 each Topical Daily  . Chlorhexidine Gluconate Cloth  6 each Topical Daily  . docusate sodium  100 mg Oral BID  . feeding supplement (ENSURE ENLIVE)  237 mL Oral TID BM  . labetalol  300 mg Oral BID  . losartan  100 mg Oral Daily  . multivitamin with minerals  1 tablet Oral Daily   Continuous Infusions: . sodium chloride 75 mL/hr at 08/02/19 1029  . ceFEPime (MAXIPIME) IV 2 g (08/02/19 0602)   PRN Meds:.acetaminophen, diphenhydrAMINE, HYDROmorphone (DILAUDID) injection, menthol-cetylpyridinium **OR** phenol, metoCLOPramide **OR** metoCLOPramide (REGLAN) injection, ondansetron **OR** ondansetron (ZOFRAN) IV, oxyCODONE, senna-docusate, sodium chloride flush, sodium phosphate,  sorbitol    Objective: Weight change:   Intake/Output Summary (Last 24 hours) at 08/02/2019 1509 Last data filed at 08/02/2019 1100 Gross per 24 hour  Intake 120 ml  Output 1125 ml  Net -1005 ml   Blood pressure 126/90, pulse 85, temperature 98.6 F (37 C), temperature source Oral, resp. rate 17, height 5\' 6"  (1.676 m), weight 77 kg, SpO2 95 %. Temp:  [97.6 F (36.4 C)-98.6 F (37 C)] 98.6 F (37 C) (02/02 0852) Pulse Rate:  [79-87] 85 (02/02 0852) Resp:  [16-18] 17 (02/02 0852) BP: (93-134)/(44-90) 126/90 (02/02 0852) SpO2:  [93 %-99 %] 95 % (02/02 0852)  Physical Exam: General: Alert and awake, oriented but confused HEENT: anicteric sclera, EOMI CVS regular rate, normal  Chest: , no wheezing, no respiratory distress Abdomen: soft non-distended,  Extremities: knee wrapped Skin: no rashes Neuro: nonfocal  CBC:    BMET Recent Labs    08/01/19 0145 08/02/19 1040  NA 132* 132*  K 4.1 3.7  CL 98 100  CO2 23 23  GLUCOSE 142* 117*  BUN 17 19  CREATININE 0.95 1.14  CALCIUM 9.2 9.3     Liver Panel  No results for input(s): PROT, ALBUMIN, AST, ALT, ALKPHOS, BILITOT, BILIDIR, IBILI in the last 72 hours.     Sedimentation Rate Recent Labs    08/02/19 0429  ESRSEDRATE 45*   C-Reactive Protein Recent  Labs    07/30/19 1815 08/02/19 0429  CRP 7.0* 9.6*    Micro Results: Recent Results (from the past 720 hour(s))  Wound or Superficial Culture     Status: None   Collection Time: 07/30/19 11:20 AM   Specimen: Wound  Result Value Ref Range Status   Specimen Description WOUND RIGHT KNEE  Final   Special Requests Normal  Final   Gram Stain   Final    RARE WBC PRESENT, PREDOMINANTLY PMN ABUNDANT GRAM NEGATIVE RODS FEW GRAM POSITIVE COCCI IN CLUSTERS Performed at [facility], [address], [place], [place] [address]    Culture ABUNDANT PSEUDOMONAS AERUGINOSA  Final   Report Status 08/01/2019 FINAL  Final   Organism ID, Bacteria PSEUDOMONAS  AERUGINOSA  Final      Susceptibility   Pseudomonas aeruginosa - MIC*    CEFTAZIDIME 2 SENSITIVE Sensitive     CIPROFLOXACIN <=0.25 SENSITIVE Sensitive     GENTAMICIN <=1 SENSITIVE Sensitive     IMIPENEM 1 SENSITIVE Sensitive     PIP/TAZO <=4 SENSITIVE Sensitive     CEFEPIME 2 SENSITIVE Sensitive     * ABUNDANT PSEUDOMONAS AERUGINOSA  Body fluid culture     Status: None   Collection Time: 07/30/19  2:44 PM   Specimen: Synovium  Result Value Ref Range Status   Specimen Description SYNOVIAL RIGHT KNEE  Final   Special Requests NONE  Final   Gram Stain   Final    ABUNDANT WBC PRESENT, PREDOMINANTLY PMN NO ORGANISMS SEEN Performed at [facility], [address] [place], [place] [address]    Culture NO GROWTH  Final   Report Status 08/02/2019 FINAL  Final  SARS CORONAVIRUS 2 (TAT 6-24 HRS) Nasopharyngeal Nasopharyngeal Swab     Status: None   Collection Time: 07/30/19  3:25 PM   Specimen: Nasopharyngeal Swab  Result Value Ref Range Status   SARS Coronavirus 2 NEGATIVE NEGATIVE Final    Comment: (NOTE) SARS-CoV-2 target nucleic acids are NOT DETECTED. The SARS-CoV-2 RNA is generally detectable in upper and lower respiratory specimens during the acute phase of infection. Negative results do not preclude SARS-CoV-2 infection, do not rule out co-infections with other pathogens, and should not be used as the sole basis for treatment or other patient management decisions. Negative results must be combined with clinical observations, patient history, and epidemiological information. The expected result is Negative. Fact Sheet for Patients: HairSlick.no Fact Sheet for Healthcare Providers: quierodirigir.com This test is not yet approved or cleared by the [place] FDA and  has been authorized for detection and/or diagnosis of SARS-CoV-2 by FDA under an Emergency Use Authorization (EUA). This EUA will remain  in effect  (meaning this test can be used) for the duration of the COVID-19 declaration under Section 56 4(b)(1) of the Act, 21 U.S.C. section 360bbb-3(b)(1), unless the authorization is terminated or revoked sooner. Performed at [facility], [address], [place], [place] [address]   MRSA PCR Screening     Status: None   Collection Time: 07/30/19 10:30 PM   Specimen: Nasal Mucosa; Nasopharyngeal  Result Value Ref Range Status   MRSA by PCR NEGATIVE NEGATIVE Final    Comment:        The GeneXpert MRSA Assay (FDA approved for NASAL specimens only), is one component of a comprehensive MRSA colonization surveillance program. It is not intended to diagnose MRSA infection nor to guide or monitor treatment for MRSA infections. Performed at [facility], [address], [place], [place] [address]   Aerobic Culture (superficial specimen)     Status: None   Collection Time: 07/31/19  8:15 AM   Specimen: Fluid  Result Value Ref Range Status   Specimen Description FLUID RIGHT KNEE  Final   Special Requests NONE  Final   Gram Stain NO WBC SEEN NO ORGANISMS SEEN   Final   Culture   Final    RARE PSEUDOMONAS AERUGINOSA CRITICAL RESULT CALLED TO, READ BACK BY AND VERIFIED WITH: RN Cline Crock [phone] FCP Performed at [facility], [address], [place], [place] [address]    Report Status 08/02/2019 FINAL  Final   Organism ID, Bacteria PSEUDOMONAS AERUGINOSA  Final      Susceptibility   Pseudomonas aeruginosa - MIC*    CEFTAZIDIME 2 SENSITIVE Sensitive     CIPROFLOXACIN <=0.25 SENSITIVE Sensitive     GENTAMICIN <=1 SENSITIVE Sensitive     IMIPENEM 2 SENSITIVE Sensitive     PIP/TAZO <=4 SENSITIVE Sensitive     CEFEPIME 2 SENSITIVE Sensitive     * RARE PSEUDOMONAS AERUGINOSA  Aerobic/Anaerobic Culture (surgical/deep wound)     Status: None (Preliminary result)   Collection Time: 07/31/19  8:23 AM   Specimen: PATH Cytology Misc. fluid; Tissue  Result Value Ref Range Status    Specimen Description TISSUE RIGHT KNEE  Final   Special Requests NONE  Final   Gram Stain   Final    MODERATE WBC PRESENT, PREDOMINANTLY PMN MODERATE GRAM POSITIVE COCCI IN PAIRS IN CLUSTERS FEW GRAM NEGATIVE RODS    Culture   Final    MODERATE PSEUDOMONAS AERUGINOSA CULTURE REINCUBATED FOR BETTER GROWTH Performed at [facility], [address] [place], [place] [address]    Report Status PENDING  Incomplete   Organism ID, Bacteria PSEUDOMONAS AERUGINOSA  Final      Susceptibility   Pseudomonas aeruginosa - MIC*    CEFTAZIDIME 4 SENSITIVE Sensitive     CIPROFLOXACIN <=0.25 SENSITIVE Sensitive     GENTAMICIN <=1 SENSITIVE Sensitive     IMIPENEM 2 SENSITIVE Sensitive     PIP/TAZO <=4 SENSITIVE Sensitive     CEFEPIME 2 SENSITIVE Sensitive     * MODERATE PSEUDOMONAS AERUGINOSA  Aerobic/Anaerobic Culture (surgical/deep wound)     Status: None (Preliminary result)   Collection Time: 07/31/19  8:28 AM   Specimen: PATH Cytology Misc. fluid; Other  Result Value Ref Range Status   Specimen Description TISSUE RIGHT KNEE DEEP TISSUE  Final   Special Requests NONE  Final   Gram Stain   Final    FEW WBC PRESENT, PREDOMINANTLY PMN NO ORGANISMS SEEN    Culture   Final    FEW PSEUDOMONAS AERUGINOSA CRITICAL RESULT CALLED TO, READ BACK BY AND VERIFIED WITH: RN Evette Doffing 1218 08/02/19 FCP CULTURE REINCUBATED FOR BETTER GROWTH Performed at [facility], [address], [place], [place] [address]    Report Status PENDING  Incomplete   Organism ID, Bacteria PSEUDOMONAS AERUGINOSA  Final      Susceptibility   Pseudomonas aeruginosa - MIC*    CEFTAZIDIME 2 SENSITIVE Sensitive     CIPROFLOXACIN <=0.25 SENSITIVE Sensitive     GENTAMICIN <=1 SENSITIVE Sensitive     IMIPENEM 1 SENSITIVE Sensitive     PIP/TAZO <=4 SENSITIVE Sensitive     CEFEPIME 2 SENSITIVE Sensitive     * FEW PSEUDOMONAS AERUGINOSA    Studies/Results: [place] EKG SITE RITE  Result Date: 08/02/2019  If [employer] not attached, placement could not be confirmed due to current cardiac rhythm.     Assessment/Plan:  INTERVAL HISTORY:  Patient to have further surgery this week  Active Problems:   Infection of total right knee replacement (HCC)   Acute pain of right knee   Pseudomonas infection    Devin [name] is a 84 y.o. 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  His cultures are growing Pseudomonas aeruginosa  #1 PJI: Quite UNFOrtunate for him to be growing this organism.    My understanding is he is going to have another exchange arthroplasty later this week followed by plastic surgery  I have significant concerns about the patient's ability to adhere to having a PICC line and having IV antibiotics and the proper wound care will be involved after his second exchange arthroplasty and plastic surgery.  If plan is to do another exchange arthroplasty I will plan for 6 weeks of IV cefepime post surgery and then follow this with 5 to 6 months with a fluoroquinolone at least hoping that he does not have CNS side effects and that he is not get C. difficile colitis   LOS: 3 days   Acey Lav 08/02/2019, 3:09 PM

## 2019-08-02 NOTE — Consult Note (Signed)
Reason for Consult/CC: Infected right total knee arthroplasty  Devin [name] is an 84 y.o. male.  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  I did seen him preoperatively in the office anticipation of the need for coverage of this issue.  He is since had a debridement by Dr. Madelon Lips and his cultures are growing out Pseudomonas.  I am consulted for assistance with soft tissue coverage over the knee prosthesis and joint.  Patient is overall doing well and is nontoxic.  He is not having a lot of pain at the moment and actually wants to get out of the hospital soon as possible  Allergies: No Known Allergies  Medications:  Current Facility-Administered Medications:  .  0.9 %  sodium chloride infusion, , Intravenous, Continuous, Chadwell, Joshua, PA-C, Last Rate: 75 mL/hr at 08/02/19 1029, New Bag at 08/02/19 1029 .  acetaminophen (TYLENOL) tablet 650 mg, 650 mg, Oral, Q4H PRN, Chadwell, Joshua, PA-C, 650 mg at 08/01/19 0940 .  aspirin chewable tablet 81 mg, 81 mg, Oral, BID, Chadwell, Joshua, PA-C, 81 mg at 08/02/19 1026 .  ceFEPIme (MAXIPIME) 2 g in sodium chloride 0.9 % 100 mL IVPB, 2 g, Intravenous, Q12H, Frederico Hamman, MD, Last Rate: 200 mL/hr at 08/02/19 0602, 2 g at 08/02/19 0602 .  Chlorhexidine Gluconate Cloth 2 % PADS 6 each, 6 each, Topical, Daily, Frederico Hamman, MD .  Chlorhexidine Gluconate Cloth 2 % PADS 6 each, 6 each, Topical, Daily, Frederico Hamman, MD .  diphenhydrAMINE (BENADRYL) 12.5 MG/5ML elixir 12.5-25 mg, 12.5-25 mg, Oral, Q4H PRN, Chadwell, Joshua, PA-C .  docusate sodium (COLACE) capsule 100 mg, 100 mg, Oral, BID, Chadwell, Joshua, PA-C, 100 mg at 08/02/19 1026 .  feeding supplement (ENSURE ENLIVE) (ENSURE ENLIVE) liquid 237 mL, 237 mL, Oral, TID BM, Frederico Hamman, MD, 237 mL at 08/02/19 1341 .  HYDROmorphone (DILAUDID) injection 0.5 mg, 0.5 mg, Intravenous, Q4H PRN, Chadwell, Joshua, PA-C, 0.5 mg at 07/31/19  1808 .  labetalol (NORMODYNE) tablet 300 mg, 300 mg, Oral, BID, Chadwell, Joshua, PA-C, 300 mg at 08/02/19 1026 .  losartan (COZAAR) tablet 100 mg, 100 mg, Oral, Daily, Chadwell, Joshua, PA-C, 100 mg at 08/02/19 1026 .  menthol-cetylpyridinium (CEPACOL) lozenge 3 mg, 1 lozenge, Oral, PRN **OR** phenol (CHLORASEPTIC) mouth spray 1 spray, 1 spray, Mouth/Throat, PRN, Chadwell, Joshua, PA-C .  metoCLOPramide (REGLAN) tablet 5-10 mg, 5-10 mg, Oral, Q8H PRN **OR** metoCLOPramide (REGLAN) injection 5-10 mg, 5-10 mg, Intravenous, Q8H PRN, Chadwell, Joshua, PA-C .  multivitamin with minerals tablet 1 tablet, 1 tablet, Oral, Daily, Frederico Hamman, MD, 1 tablet at 08/02/19 1026 .  ondansetron (ZOFRAN) tablet 4 mg, 4 mg, Oral, Q6H PRN **OR** ondansetron (ZOFRAN) injection 4 mg, 4 mg, Intravenous, Q6H PRN, Chadwell, Joshua, PA-C .  oxyCODONE (Oxy IR/ROXICODONE) immediate release tablet 5-10 mg, 5-10 mg, Oral, Q4H PRN, Chadwell, Joshua, PA-C, 10 mg at 08/02/19 1026 .  senna-docusate (Senokot-S) tablet 1 tablet, 1 tablet, Oral, QHS PRN, Chadwell, Joshua, PA-C .  sodium chloride flush (NS) 0.9 % injection 10-40 mL, 10-40 mL, Intracatheter, PRN, Frederico Hamman, MD .  sodium phosphate (FLEET) 7-19 GM/118ML enema 1 enema, 1 enema, Rectal, Once PRN, Chadwell, Joshua, PA-C .  sorbitol 70 % solution 30 mL, 30 mL, Oral, Daily PRN, Margart Sickles, PA-C  Past Medical History:  Diagnosis Date  . Hypertension   . Hypertension     Past Surgical History:  Procedure Laterality Date  . APPLICATION OF WOUND VAC Right 07/31/2019  Procedure: Application Of Wound Vac;  Surgeon: Earlie Server, MD;  Location: [facility];  Service: Orthopedics;  Laterality: Right;  . CLOSED REDUCTION FEMORAL SHAFT FRACTURE Left   . TOTAL KNEE ARTHROPLASTY Right 06/03/2019   Procedure: TOTAL KNEE ARTHROPLASTY, ORIF MEDIAL CONDYLE, LATERAL RELEASE;  Surgeon: Earlie Server, MD;  Location: [facility];  Service: Orthopedics;  Laterality: Right;  .  TOTAL KNEE REVISION Right 07/31/2019   Procedure: RIGHT KNEE IRRIGATION AND DEBRIDEMENT with POLY EXCHANGE and PLACEMENT of STIMULAN BEADS;  Surgeon: Earlie Server, MD;  Location: [facility];  Service: Orthopedics;  Laterality: Right;    Family History  Problem Relation Age of Onset  . Stroke Mother     Social History:  reports that he has never smoked. He has never used smokeless tobacco. He reports that he does not drink alcohol or use drugs.   Blood pressure 126/90, pulse 85, temperature 98.6 F (37 C), temperature source Oral, resp. rate 17, height 5\' 6"  (1.676 m), weight 77 kg, SpO2 95 %.  Physical exam His right knee is immobilized with a wound VAC in place.  He has good sensation and movement in his foot.   Results for orders placed or performed during the hospital encounter of 07/30/19 (from the past 48 hour(s))  CBC     Status: Abnormal   Collection Time: 08/01/19  1:45 AM  Result Value Ref Range   WBC 10.2 4.0 - 10.5 K/uL   RBC 3.33 (L) 4.22 - 5.81 MIL/uL   Hemoglobin 10.1 (L) 13.0 - 17.0 g/dL   HCT 30.1 (L) 39.0 - 52.0 %   MCV 90.4 80.0 - 100.0 fL   MCH 30.3 26.0 - 34.0 pg   MCHC 33.6 30.0 - 36.0 g/dL   RDW 13.3 11.5 - 15.5 %   Platelets 251 150 - 400 K/uL   nRBC 0.0 0.0 - 0.2 %    Comment: Performed at [facility], [place] [address] [place], [place] [address]  Basic metabolic panel     Status: Abnormal   Collection Time: 08/01/19  1:45 AM  Result Value Ref Range   Sodium 132 (L) 135 - 145 mmol/L   Potassium 4.1 3.5 - 5.1 mmol/L   Chloride 98 98 - 111 mmol/L   CO2 23 22 - 32 mmol/L   Glucose, Bld 142 (H) 70 - 99 mg/dL   BUN 17 8 - 23 mg/dL   Creatinine, Ser 0.95 0.61 - 1.24 mg/dL   Calcium 9.2 8.9 - 10.3 mg/dL   GFR calc non Af Amer >60 >60 mL/min   GFR calc Af Amer >60 >60 mL/min   Anion gap 11 5 - 15    Comment: Performed at [facility] [address] [place], [place] [address]  CBC     Status: Abnormal   Collection Time: 08/02/19  4:29 AM   Result Value Ref Range   WBC 6.7 4.0 - 10.5 K/uL   RBC 3.01 (L) 4.22 - 5.81 MIL/uL   Hemoglobin 9.2 (L) 13.0 - 17.0 g/dL   HCT 27.3 (L) 39.0 - 52.0 %   MCV 90.7 80.0 - 100.0 fL   MCH 30.6 26.0 - 34.0 pg   MCHC 33.7 30.0 - 36.0 g/dL   RDW 13.7 11.5 - 15.5 %   Platelets 238 150 - 400 K/uL   nRBC 0.0 0.0 - 0.2 %    Comment: Performed at [facility], [place] [address], [place], [place] [address]  Sedimentation rate  Status: Abnormal   Collection Time: 08/02/19  4:29 AM  Result Value Ref Range   Sed Rate 45 (H) 0 - 16 mm/hr    Comment: Performed at [facility], [address], [place], [place] [address]  C-reactive protein     Status: Abnormal   Collection Time: 08/02/19  4:29 AM  Result Value Ref Range   CRP 9.6 (H) <1.0 mg/dL    Comment: Performed at [facility], [address], [place], [place] [address]  Hepatitis C antibody     Status: None   Collection Time: 08/02/19  4:29 AM  Result Value Ref Range   HCV Ab NON REACTIVE NON REACTIVE    Comment: (NOTE) Nonreactive HCV antibody screen is consistent with no HCV infections,  unless recent infection is suspected or other evidence exists to indicate HCV infection. Performed at [facility], [address] [place], [place] [address]   Basic metabolic panel     Status: Abnormal   Collection Time: 08/02/19 10:40 AM  Result Value Ref Range   Sodium 132 (L) 135 - 145 mmol/L   Potassium 3.7 3.5 - 5.1 mmol/L   Chloride 100 98 - 111 mmol/L   CO2 23 22 - 32 mmol/L   Glucose, Bld 117 (H) 70 - 99 mg/dL   BUN 19 8 - 23 mg/dL   Creatinine, Ser 9.83 0.61 - 1.24 mg/dL   Calcium 9.3 8.9 - 38.2 mg/dL   GFR calc non Af Amer 60 (L) >60 mL/min   GFR calc Af Amer >60 >60 mL/min   Anion gap 9 5 - 15    Comment: Performed at [facility], [address] [place], [place] [address]    [place] EKG SITE RITE  Result Date: 08/02/2019 If [facility] image not attached, placement could not be confirmed due to current  cardiac rhythm.   Assessment/Plan: Patient presents with an infected right total knee arthroplasty status post debridement with cultures growing out Pseudomonas.  I had a number of detailed conversations with Dr. Madelon Lips about how to proceed.  We discussed the pros and cons of the different ways to manage to the hardware in this setting.  He is consulted with multiple colleagues all of which agree that another polyethylene exchange and washout should be done followed by soft tissue coverage.  We have discussed the potential for postoperative infection with this plan.  However there is also a significant risk of morbidity if the primary components of the arthroplasty are removed.  After weighing the risk and benefits I think it is reasonable to proceed with rotation of the medial head of the gastroc with a split-thickness skin graft later this week.  I explained this procedure in detail to the patient and his daughter-in-law.  We discussed the risk that include bleeding, infection, damage to surrounding structures including nerves, need for additional procedures.  We discussed the potential for the blood supply to the flap to be suboptimal for a variety of reasons and that this could result in flap failure.  I explained that I do not believe he will have much of a functional deficit after he is healed as the remaining muscles in place will be able to serve that function for him.  I discussed with the patient and Dr. Madelon Lips that should the infection recur the patient may require soft tissue coverage procedures that are beyond my capability to perform in this setting.  If that were to be the  case he would require transfer to a tertiary care facility.  Postoperatively the patient should be toe-touch weightbearing to the right leg and spent most of his time with the leg elevated for the first 2 weeks.  Allena Napoleon 08/02/2019, 2:24 PM

## 2019-08-02 NOTE — Progress Notes (Signed)
Subjective: 2 Days Post-Op Procedure(s) (LRB): RIGHT KNEE IRRIGATION AND DEBRIDEMENT with POLY EXCHANGE and PLACEMENT of STIMULAN BEADS (Right) Application Of Wound Vac (Right) Patient reports pain as mild and moderate.    Objective: Vital signs in last 24 hours: Temp:  [97.6 F (36.4 C)-98.6 F (37 C)] 97.9 F (36.6 C) (02/02 1945) Pulse Rate:  [79-90] 82 (02/02 1945) Resp:  [16-17] 17 (02/02 1622) BP: (93-150)/(44-90) 146/56 (02/02 1945) SpO2:  [93 %-96 %] 96 % (02/02 1945)  Intake/Output from previous day: 02/01 0701 - 02/02 0700 In: -  Out: 925 [Urine:350; Drains:575] Intake/Output this shift: No intake/output data recorded.  Recent Labs    08/01/19 0145 08/02/19 0429  HGB 10.1* 9.2*   Recent Labs    08/01/19 0145 08/02/19 0429  WBC 10.2 6.7  RBC 3.33* 3.01*  HCT 30.1* 27.3*  PLT 251 238   Recent Labs    08/01/19 0145 08/02/19 1040  NA 132* 132*  K 4.1 3.7  CL 98 100  CO2 23 23  BUN 17 19  CREATININE 0.95 1.14  GLUCOSE 142* 117*  CALCIUM 9.2 9.3   No results for input(s): LABPT, INR in the last 72 hours.  Neurovascular intact Sensation intact distally Intact pulses distally Dorsiflexion/Plantar flexion intact Incision: wound vac in place and functioning .   Assessment/Plan: 2 Days Post-Op Procedure(s) (LRB): RIGHT KNEE IRRIGATION AND DEBRIDEMENT with POLY EXCHANGE and PLACEMENT of STIMULAN BEADS (Right) Application Of Wound Vac (Right)   Continue wound vac treatment Continue abx per ID Continue pain control as ordered limit opoid if possible to avoid confusion Aspirin dvt proph Plan for OR on Fri with Ortho and Plastics for repeat I&D poly exchange and closure by plastics.    Margart Sickles 08/02/2019, 9:29 PM

## 2019-08-02 NOTE — Progress Notes (Signed)
Sleeping. Vac functioning. Currently full blood tinged fluid.HGB 9.2 Continue to monitor functioning wound vac

## 2019-08-02 NOTE — Progress Notes (Addendum)
PHARMACY CONSULT NOTE FOR:  OUTPATIENT  PARENTERAL ANTIBIOTIC THERAPY (OPAT)  Indication: pseudomonas prosthetic joint infection  Regimen: cefepime 2g IV q12h End date: 09/16/2019  IV antibiotic discharge orders are pended. To discharging provider:  please sign these orders via discharge navigator,  Select New Orders & click on the button choice - Manage This Unsigned Work.     Thank you for allowing pharmacy to be a part of this patient's care.  Gerrit Halls, PharmD PGY1 Pharmacy Resident [employer]: [phone]  08/02/2019, 10:34 AM

## 2019-08-03 LAB — CBC
HCT: 27.2 % — ABNORMAL LOW (ref 39.0–52.0)
Hemoglobin: 8.8 g/dL — ABNORMAL LOW (ref 13.0–17.0)
MCH: 29.8 pg (ref 26.0–34.0)
MCHC: 32.4 g/dL (ref 30.0–36.0)
MCV: 92.2 fL (ref 80.0–100.0)
Platelets: 241 10*3/uL (ref 150–400)
RBC: 2.95 MIL/uL — ABNORMAL LOW (ref 4.22–5.81)
RDW: 13.4 % (ref 11.5–15.5)
WBC: 6.1 10*3/uL (ref 4.0–10.5)
nRBC: 0 % (ref 0.0–0.2)

## 2019-08-03 NOTE — Progress Notes (Signed)
Physical Therapy Treatment Patient Details Name: Devin [name] MRN: [id number] DOB: 1935-11-14 Today's Date: 08/03/2019    History of Present Illness Pt is an 84 y/o admitted with Rt TKA necrosis with pyarthrosis s/p I&D with TKA revision. PMhx: Rt TKA 06/03/19, HTN, Covid 19 05/2019    PT Comments    Pt very restless and distracted throughout with poor attention span. Attempted to explain and demonstrate TDWB as per Plastics note from 2/2 that is what his [place] status will be following surgery on Friday. However, pt not attentive and then changing the subject randomly throughout. Unsure how well pt will be able to ambulate and maintain TDWB'ing on R LE. Will continue to follow pt acutely to progress mobility as tolerated per PT POC.   Follow Up Recommendations  Home health PT;Supervision/Assistance - 24 hour     Equipment Recommendations  None recommended by PT    Recommendations for Other Services       Precautions / Restrictions Precautions Precautions: Knee;Fall Precaution Comments: VAC Required Braces or Orthoses: Knee Immobilizer - Right Knee Immobilizer - Right: On at all times Restrictions Weight Bearing Restrictions: Yes RLE Weight Bearing: Weight bearing as tolerated    Mobility  Bed Mobility Overal bed mobility: Needs Assistance Bed Mobility: Supine to Sit     Supine to sit: Min guard     General bed mobility comments: guarding for safety and cues  Transfers Overall transfer level: Needs assistance Equipment used: Rolling walker (2 wheeled) Transfers: Sit to/from Stand Sit to Stand: Min guard         General transfer comment: cues for hand placement and safety  Ambulation/Gait Ambulation/Gait assistance: Min guard Gait Distance (Feet): 10 Feet Assistive device: Rolling walker (2 wheeled) Gait Pattern/deviations: Step-to pattern;Decreased stride length;Trunk flexed Gait velocity: decreased   General Gait Details: cueing for safety and  assistance with line management; pt overall restless and frustrated with situation, very easily distracted and unsafe   Stairs             Wheelchair Mobility    Modified Rankin (Stroke Patients Only)       Balance Overall balance assessment: Needs assistance Sitting-balance support: Feet supported Sitting balance-Leahy Scale: Fair     Standing balance support: Bilateral upper extremity supported;During functional activity Standing balance-Leahy Scale: Poor Standing balance comment: reliance on bil UE and RW                            Cognition Arousal/Alertness: Awake/alert Behavior During Therapy: Restless Overall Cognitive Status: Impaired/Different from baseline Area of Impairment: Attention;Memory;Safety/judgement;Following commands;Problem solving                   Current Attention Level: Selective Memory: Decreased short-term memory;Decreased recall of precautions Following Commands: Follows multi-step commands with increased time Safety/Judgement: Decreased awareness of safety;Decreased awareness of deficits   Problem Solving: Difficulty sequencing;Requires verbal cues        Exercises      General Comments        Pertinent Vitals/Pain Pain Assessment: Faces Faces Pain Scale: Hurts even more Pain Location: right knee Pain Descriptors / Indicators: Aching;Sore;Guarding Pain Intervention(s): Monitored during session;Repositioned    Home Living                      Prior Function            PT Goals (current goals can now be found in the  care plan section) Acute Rehab PT Goals PT Goal Formulation: With patient Time For Goal Achievement: 08/15/19 Potential to Achieve Goals: Fair Progress towards PT goals: Progressing toward goals    Frequency    Min 5X/week      PT Plan Current plan remains appropriate    Co-evaluation              AM-PAC PT "6 Clicks" Mobility   Outcome Measure  Help needed  turning from your back to your side while in a flat bed without using bedrails?: None Help needed moving from lying on your back to sitting on the side of a flat bed without using bedrails?: None Help needed moving to and from a bed to a chair (including a wheelchair)?: A Little Help needed standing up from a chair using your arms (e.g., wheelchair or bedside chair)?: A Little Help needed to walk in hospital room?: A Little Help needed climbing 3-5 steps with a railing? : A Lot 6 Click Score: 19    End of Session Equipment Utilized During Treatment: Gait belt;Right knee immobilizer Activity Tolerance: Patient tolerated treatment well Patient left: in chair;with call bell/phone within reach;with chair alarm set Nurse Communication: Mobility status;Precautions;Weight bearing status PT Visit Diagnosis: Other abnormalities of gait and mobility (R26.89);Muscle weakness (generalized) (M62.81)     Time: 1006-1020 PT Time Calculation (min) (ACUTE ONLY): 14 min  Charges:  $Therapeutic Activity: 8-22 mins                     Arletta Bale, DPT  Acute Rehabilitation Services Pager [phone] Office [phone]     Devin [name] 08/03/2019, 1:06 PM

## 2019-08-03 NOTE — Clinical Social Work Note (Signed)
Per RNCM Renee Angiulli note, patient was previously referred to [facility]. CSW called and spoke with Elnita Maxwell [phone] she states that she will have the weekend team save him a spot for a weekend "opening" in the event he is discharged.   Right now plan is for OR on Friday. Discharge date is unknown at this time. Patient will likely need PT, OT, and RN for IV abx at discharge.   TOC will continue to follow.

## 2019-08-03 NOTE — Care Plan (Signed)
Patient is part of ortho bundle.  He lives at home and family provides care and support. His main support is his Daughter in Social worker - LuAnn. She is the main contact - [phone].  Patient will not answer his cell phone.  He has all needed equipment at home. He was open to [facility] with anchor event and I have spoken with them. They are able to provided the needed aftercare with this admission. Referral number is [phone]. Fax # [phone]. They will need specific wound care and abx instructions/orders.   Please contact me with any questions.   Shauna Hugh, RNCM  [phone]

## 2019-08-03 NOTE — Progress Notes (Signed)
Occupational Therapy Treatment Patient Details Name: Devin [name] MRN: [id number] DOB: [birth year] Today's Date: 08/03/2019    History of present illness Pt is an 84 y/o admitted with Rt TKA necrosis with pyarthrosis s/p I&D with TKA revision. PMhx: Rt TKA 06/03/19, HTN, Covid 19 05/2019   OT comments  Pt making steady progress with functional goals. Pt requires verbal and physical cues for safety as pt is impulsive, confused at times and HOH. Pt in recliner upon arrival; session focused on ADLs and ADL mobility safety with functional mobility to bathroom with RW, toilet transfers, grooming/hygiene standing at sink, toileting and LB ADLs (simulated). Pt going back to OR on Friday and Weight bearing statis set to change per previous PA note. Hangat rin at end of session with hinged R knee brace. OT will continue to follow acutely  Follow Up Recommendations  Home health OT    Equipment Recommendations  Other (comment)(reacher, TBD)    Recommendations for Other Services      Precautions / Restrictions Precautions Precautions: Knee;Fall Precaution Comments: VAC Required Braces or Orthoses: Knee Immobilizer - Right Knee Immobilizer - Right: On at all times Restrictions Weight Bearing Restrictions: Yes RLE Weight Bearing: Weight bearing as tolerated Other Position/Activity Restrictions: WB status may change after surgery  Friday 08/05/2019       Mobility Bed Mobility Overal bed mobility: Needs Assistance Bed Mobility: Sit to Supine     Supine to sit: Min guard Sit to supine: Min guard   General bed mobility comments: pt in recliner upon arrival. Assisted back to bed at end of session  Transfers Overall transfer level: Needs assistance Equipment used: Rolling walker (2 wheeled) Transfers: Sit to/from Stand Sit to Stand: Min guard         General transfer comment: cues for hand placement and safety    Balance Overall balance assessment: Needs assistance Sitting-balance  support: Feet supported Sitting balance-Leahy Scale: Fair     Standing balance support: Bilateral upper extremity supported;During functional activity Standing balance-Leahy Scale: Poor Standing balance comment: reliance on bil UE and RW                           ADL either performed or assessed with clinical judgement   ADL Overall ADL's : Needs assistance/impaired     Grooming: Wash/dry hands;Wash/dry face;Min guard;Standing;Cueing for safety       Lower Body Bathing: Moderate assistance;Minimal assistance Lower Body Bathing Details (indicate cue type and reason): simulated     Lower Body Dressing: Moderate assistance Lower Body Dressing Details (indicate cue type and reason): simulated Toilet Transfer: Min guard;Ambulation;RW;Cueing for safety   Toileting- Clothing Manipulation and Hygiene: Minimal assistance;Min guard;Sit to/from stand       Functional mobility during ADLs: Min guard;Rolling walker;Cueing for safety General ADL Comments: Required to repeat instructions multiple times due to pt [facility]     Vision Baseline Vision/History: Wears glasses Patient Visual Report: No change from baseline     Perception     Praxis      Cognition Arousal/Alertness: Awake/alert Behavior During Therapy: WFL for tasks assessed/performed Overall Cognitive Status: Impaired/Different from baseline Area of Impairment: Attention;Memory;Safety/judgement;Following commands;Problem solving                   Current Attention Level: Selective Memory: Decreased short-term memory;Decreased recall of precautions Following Commands: Follows multi-step commands with increased time Safety/Judgement: Decreased awareness of safety;Decreased awareness of deficits   Problem Solving: Difficulty sequencing;Requires  verbal cues          Exercises     Shoulder Instructions       General Comments      Pertinent Vitals/ Pain       Pain Assessment: Faces Faces Pain  Scale: Hurts little more Pain Location: right knee Pain Descriptors / Indicators: Aching;Sore;Guarding Pain Intervention(s): Limited activity within patient's tolerance;Repositioned  Home Living                                          Prior Functioning/Environment              Frequency  Min 2X/week        Progress Toward Goals  OT Goals(current goals can now be found in the care plan section)  Progress towards OT goals: Progressing toward goals  Acute Rehab OT Goals Patient Stated Goal: go home  Plan Discharge plan remains appropriate    Co-evaluation                 AM-PAC OT "6 Clicks" Daily Activity     Outcome Measure   Help from another person eating meals?: None Help from another person taking care of personal grooming?: None Help from another person toileting, which includes using toliet, bedpan, or urinal?: A Little Help from another person bathing (including washing, rinsing, drying)?: A Lot Help from another person to put on and taking off regular upper body clothing?: None Help from another person to put on and taking off regular lower body clothing?: A Lot 6 Click Score: 19    End of Session Equipment Utilized During Treatment: Gait belt;Rolling walker;Other (comment)(3 in 1)  OT Visit Diagnosis: Other abnormalities of gait and mobility (R26.89);Pain Pain - Right/Left: Right Pain - part of body: Knee;Leg   Activity Tolerance Patient tolerated treatment well   Patient Left in bed;with call bell/phone within reach;with bed alarm set;Other (comment)(Hangar in with pt to fit hinged R knee brace)   Nurse Communication          Time: 8115-7262 OT Time Calculation (min): 26 min  Charges: OT General Charges $OT Visit: 1 Visit OT Treatments $Self Care/Home Management : 8-22 mins $Therapeutic Activity: 8-22 mins     Britt Bottom 08/03/2019, 1:49 PM

## 2019-08-03 NOTE — Progress Notes (Signed)
Subjective:  Patient was busy with incentive spirometer  Antibiotics:  Anti-infectives (From admission, onward)   Start     Dose/Rate Route Frequency Ordered Stop   08/01/19 0800  vancomycin (VANCOCIN) IVPB 1000 mg/200 mL premix  Status:  Discontinued     1,000 mg 200 mL/hr over 60 Minutes Intravenous Every 24 hours 07/31/19 1131 08/01/19 1051   07/31/19 1345  ceFEPIme (MAXIPIME) 2 g in sodium chloride 0.9 % 100 mL IVPB     2 g 200 mL/hr over 30 Minutes Intravenous Every 12 hours 07/31/19 1336     07/31/19 1100  cefTRIAXone (ROCEPHIN) 2 g in sodium chloride 0.9 % 100 mL IVPB  Status:  Discontinued     2 g 200 mL/hr over 30 Minutes Intravenous Every 24 hours 07/31/19 1054 07/31/19 1329   07/31/19 0832  vancomycin (VANCOCIN) powder  Status:  Discontinued       As needed 07/31/19 0833 07/31/19 0915   07/31/19 0831  gentamicin (GARAMYCIN) injection  Status:  Discontinued       As needed 07/31/19 0832 07/31/19 0915   07/31/19 0815  vancomycin (VANCOREADY) IVPB 1500 mg/300 mL     1,500 mg 150 mL/hr over 120 Minutes Intravenous To Surgery 07/31/19 0807 07/31/19 1021      Medications: Scheduled Meds: . aspirin  81 mg Oral BID  . Chlorhexidine Gluconate Cloth  6 each Topical Daily  . Chlorhexidine Gluconate Cloth  6 each Topical Daily  . docusate sodium  100 mg Oral BID  . feeding supplement (ENSURE ENLIVE)  237 mL Oral TID BM  . labetalol  300 mg Oral BID  . losartan  100 mg Oral Daily  . multivitamin with minerals  1 tablet Oral Daily   Continuous Infusions: . sodium chloride 75 mL/hr at 08/02/19 1029  . ceFEPime (MAXIPIME) IV 2 g (08/03/19 0433)   PRN Meds:.acetaminophen, diphenhydrAMINE, HYDROmorphone (DILAUDID) injection, menthol-cetylpyridinium **OR** phenol, metoCLOPramide **OR** metoCLOPramide (REGLAN) injection, ondansetron **OR** ondansetron (ZOFRAN) IV, oxyCODONE, senna-docusate, sodium chloride flush, sodium phosphate, sorbitol    Objective: Weight  change:   Intake/Output Summary (Last 24 hours) at 08/03/2019 1527 Last data filed at 08/03/2019 1300 Gross per 24 hour  Intake 240 ml  Output 800 ml  Net -560 ml   Blood pressure (!) 161/81, pulse 90, temperature 98.4 F (36.9 C), temperature source Oral, resp. rate 17, height 5\' 6"  (1.676 m), weight 77 kg, SpO2 96 %. Temp:  [97.8 F (36.6 C)-98.5 F (36.9 C)] 98.4 F (36.9 C) (02/03 1406) Pulse Rate:  [80-90] 90 (02/03 1406) Resp:  [15-17] 17 (02/03 1406) BP: (134-161)/(56-81) 161/81 (02/03 1406) SpO2:  [93 %-96 %] 96 % (02/03 1406)  Physical Exam: Patient was busy with incentive spirometer Neuro: nonfocal  CBC:    BMET Recent Labs    08/01/19 0145 08/02/19 1040  NA 132* 132*  K 4.1 3.7  CL 98 100  CO2 23 23  GLUCOSE 142* 117*  BUN 17 19  CREATININE 0.95 1.14  CALCIUM 9.2 9.3     Liver Panel  No results for input(s): PROT, ALBUMIN, AST, ALT, ALKPHOS, BILITOT, BILIDIR, IBILI in the last 72 hours.     Sedimentation Rate Recent Labs    08/02/19 0429  ESRSEDRATE 45*   C-Reactive Protein Recent Labs    08/02/19 0429  CRP 9.6*    Micro Results: Recent Results (from the past 720 hour(s))  Wound or Superficial Culture     Status: None  Collection Time: 07/30/19 11:20 AM   Specimen: Wound  Result Value Ref Range Status   Specimen Description WOUND RIGHT KNEE  Final   Special Requests Normal  Final   Gram Stain   Final    RARE WBC PRESENT, PREDOMINANTLY PMN ABUNDANT GRAM NEGATIVE RODS FEW GRAM POSITIVE COCCI IN CLUSTERS Performed at [facility], [address], [place], [place] [address]    Culture ABUNDANT PSEUDOMONAS AERUGINOSA  Final   Report Status 08/01/2019 FINAL  Final   Organism ID, Bacteria PSEUDOMONAS AERUGINOSA  Final      Susceptibility   Pseudomonas aeruginosa - MIC*    CEFTAZIDIME 2 SENSITIVE Sensitive     CIPROFLOXACIN <=0.25 SENSITIVE Sensitive     GENTAMICIN <=1 SENSITIVE Sensitive     IMIPENEM 1 SENSITIVE Sensitive       PIP/TAZO <=4 SENSITIVE Sensitive     CEFEPIME 2 SENSITIVE Sensitive     * ABUNDANT PSEUDOMONAS AERUGINOSA  Body fluid culture     Status: None   Collection Time: 07/30/19  2:44 PM   Specimen: Synovium  Result Value Ref Range Status   Specimen Description SYNOVIAL RIGHT KNEE  Final   Special Requests NONE  Final   Gram Stain   Final    ABUNDANT WBC PRESENT, PREDOMINANTLY PMN NO ORGANISMS SEEN Performed at [facility], [address], [place], [place] [address]    Culture NO GROWTH  Final   Report Status 08/02/2019 FINAL  Final  SARS CORONAVIRUS 2 (TAT 6-24 HRS) Nasopharyngeal Nasopharyngeal Swab     Status: None   Collection Time: 07/30/19  3:25 PM   Specimen: Nasopharyngeal Swab  Result Value Ref Range Status   SARS Coronavirus 2 NEGATIVE NEGATIVE Final    Comment: (NOTE) SARS-CoV-2 target nucleic acids are NOT DETECTED. The SARS-CoV-2 RNA is generally detectable in upper and lower respiratory specimens during the acute phase of infection. Negative results do not preclude SARS-CoV-2 infection, do not rule out co-infections with other pathogens, and should not be used as the sole basis for treatment or other patient management decisions. Negative results must be combined with clinical observations, patient history, and epidemiological information. The expected result is Negative. Fact Sheet for Patients: HairSlick.no Fact Sheet for Healthcare Providers: quierodirigir.com This test is not yet approved or cleared by the [place] FDA and  has been authorized for detection and/or diagnosis of SARS-CoV-2 by FDA under an Emergency Use Authorization (EUA). This EUA will remain  in effect (meaning this test can be used) for the duration of the COVID-19 declaration under Section 56 4(b)(1) of the Act, 21 U.S.C. section 360bbb-3(b)(1), unless the authorization is terminated or revoked sooner. Performed at [facility], [address] [place], [place] [address]   MRSA PCR Screening     Status: None   Collection Time: 07/30/19 10:30 PM   Specimen: Nasal Mucosa; Nasopharyngeal  Result Value Ref Range Status   MRSA by PCR NEGATIVE NEGATIVE Final    Comment:        The GeneXpert MRSA Assay (FDA approved for NASAL specimens only), is one component of a comprehensive MRSA colonization surveillance program. It is not intended to diagnose MRSA infection nor to guide or monitor treatment for MRSA infections. Performed at [facility], [address], [place], [place] [address]   Aerobic Culture (superficial specimen)     Status: None   Collection Time: 07/31/19  8:15 AM   Specimen: Fluid  Result Value Ref  Range Status   Specimen Description FLUID RIGHT KNEE  Final   Special Requests NONE  Final   Gram Stain NO WBC SEEN NO ORGANISMS SEEN   Final   Culture   Final    RARE PSEUDOMONAS AERUGINOSA CRITICAL RESULT CALLED TO, READ BACK BY AND VERIFIED WITH: RN Cline Crock [phone] FCP Performed at [facility], [address] [place], [place] [address]    Report Status 08/02/2019 FINAL  Final   Organism ID, Bacteria PSEUDOMONAS AERUGINOSA  Final      Susceptibility   Pseudomonas aeruginosa - MIC*    CEFTAZIDIME 2 SENSITIVE Sensitive     CIPROFLOXACIN <=0.25 SENSITIVE Sensitive     GENTAMICIN <=1 SENSITIVE Sensitive     IMIPENEM 2 SENSITIVE Sensitive     PIP/TAZO <=4 SENSITIVE Sensitive     CEFEPIME 2 SENSITIVE Sensitive     * RARE PSEUDOMONAS AERUGINOSA  Aerobic/Anaerobic Culture (surgical/deep wound)     Status: None (Preliminary result)   Collection Time: 07/31/19  8:23 AM   Specimen: PATH Cytology Misc. fluid; Tissue  Result Value Ref Range Status   Specimen Description TISSUE RIGHT KNEE  Final   Special Requests NONE  Final   Gram Stain   Final    MODERATE WBC PRESENT, PREDOMINANTLY PMN MODERATE GRAM POSITIVE COCCI IN PAIRS IN CLUSTERS FEW GRAM NEGATIVE RODS Performed  at [facility], [address], [place], [place] [address]    Culture   Final    MODERATE PSEUDOMONAS AERUGINOSA NO ANAEROBES ISOLATED; CULTURE IN PROGRESS FOR 5 DAYS    Report Status PENDING  Incomplete   Organism ID, Bacteria PSEUDOMONAS AERUGINOSA  Final      Susceptibility   Pseudomonas aeruginosa - MIC*    CEFTAZIDIME 4 SENSITIVE Sensitive     CIPROFLOXACIN <=0.25 SENSITIVE Sensitive     GENTAMICIN <=1 SENSITIVE Sensitive     IMIPENEM 2 SENSITIVE Sensitive     PIP/TAZO <=4 SENSITIVE Sensitive     CEFEPIME 2 SENSITIVE Sensitive     * MODERATE PSEUDOMONAS AERUGINOSA  Aerobic/Anaerobic Culture (surgical/deep wound)     Status: None (Preliminary result)   Collection Time: 07/31/19  8:28 AM   Specimen: PATH Cytology Misc. fluid; Other  Result Value Ref Range Status   Specimen Description TISSUE RIGHT KNEE DEEP TISSUE  Final   Special Requests NONE  Final   Gram Stain   Final    FEW WBC PRESENT, PREDOMINANTLY PMN NO ORGANISMS SEEN    Culture   Final    FEW PSEUDOMONAS AERUGINOSA CRITICAL RESULT CALLED TO, READ BACK BY AND VERIFIED WITH: RN Evette Doffing 1218 08/02/19 FCP CULTURE REINCUBATED FOR BETTER GROWTH Performed at [facility], [address], [place], [place] [address]    Report Status PENDING  Incomplete   Organism ID, Bacteria PSEUDOMONAS AERUGINOSA  Final      Susceptibility   Pseudomonas aeruginosa - MIC*    CEFTAZIDIME 2 SENSITIVE Sensitive     CIPROFLOXACIN <=0.25 SENSITIVE Sensitive     GENTAMICIN <=1 SENSITIVE Sensitive     IMIPENEM 1 SENSITIVE Sensitive     PIP/TAZO <=4 SENSITIVE Sensitive     CEFEPIME 2 SENSITIVE Sensitive     * FEW PSEUDOMONAS AERUGINOSA    Studies/Results: [place] EKG SITE RITE  Result Date: 08/02/2019 If Site Rite image not attached, placement could not be confirmed due to current cardiac rhythm.     Assessment/Plan:  INTERVAL HISTORY:  Patient to have surgery on Friday  Active Problems:   Infection of total right  knee replacement (HCC)   Acute pain of right knee   Pseudomonas infection   Right knee skin infection   S/P revision of total knee, right    Devin [name] is a 84 y.o. 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  His cultures are growing Pseudomonas aeruginosa  #1 PJI: Quite UNFOrtunate for him to be growing this organism.    My understanding is he is going to have another exchange arthroplasty later this week followed by plastic surgery  I have significant concerns about the patient's ability to adhere to having a PICC line and having IV antibiotics and the proper wound care will be involved after his second exchange arthroplasty and plastic surgery.  If plan is to do another exchange arthroplasty I will plan for 6 weeks of IV cefepime post surgery and then follow this with 5 to 6 months with a fluoroquinolone at least hoping that he does not have CNS side effects and that he is not get C. difficile colitis  I will arrange HSFU with [place] and place OPAT orders    LOS: 4 days   Acey Lav 08/03/2019, 3:27 PM

## 2019-08-03 NOTE — Progress Notes (Signed)
Pharmacy Antibiotic Note  Devin [name] is a 84 y.o. male admitted on 07/30/2019 with septic right total knee arthroplasty w/ gangrenous wound dehisence over patella / patella tendon. Patient is 2 months s/p right total knee arthroplasty.  Pharmacy has been consulted for  cefepime dosing.   Patient growing pan-S Pseudomonas from wound cultures and is currently planned to be on cefepime until 09/11/19. However, patient returning to OR on 2/5 so plans might change.   Patient's Scr is up to 1.14 with a CrCl of 49.7. Patient currently on q12h cefepime, but if renal function improves may need q8h.    Plan:  Cefepime 2gm IV q12h F/u after surgery for updated plan for LOT  Temp (24hrs), Avg:98.1 F (36.7 C), Min:97.8 F (36.6 C), Max:98.5 F (36.9 C)  Recent Labs  Lab 07/30/19 1140 08/01/19 0145 08/02/19 0429 08/02/19 1040 08/03/19 0311  WBC 7.6 10.2 6.7  --  6.1  CREATININE 1.24 0.95  --  1.14  --     Estimated Creatinine Clearance: 49.7 mL/min (by C-G formula based on SCr of 1.14 mg/dL).    No Known Allergies  Antimicrobials this admission: 1/31 Vancomycin >>2/1  1/31 Ceftriaxone >> 1/31  1/31 Cefepime >> (3/14)   Dose adjustments this admission: n/a  Microbiology results: 1/30 wound right knee >> rare WBC, abundant GNRs, few GPC in clusters; now ABUNDANT PSEUDOMONAS AERUGINOSA 1/30 synovial right knee body fluid >> NG x24h 1/31 surgical deep wound tissue culture >> sent   Patryk Conant A. Jeanella Craze, PharmD, BCPS, FNKF Clinical Pharmacist [place] Please utilize Amion for appropriate phone number to reach the unit pharmacist [facility])

## 2019-08-03 NOTE — Consult Note (Signed)
NAME: Devin [name] MRN:   [id number] DOB:   08-Nov-1935   CHIEF COMPLAINT:  Right knee pain  HISTORY:   Devin Lundberg Nickelstonis a 84 y.o. male  with right  Knee Pain Patient presents complicated situation involving the right knee. Patient underwent a right TKA on 06/03/19. He presented to the ED on 07/30/19 with an incision that dehisced. He underwent an open synovectomy with poly exchange on 1/31and the distal capsule was unable to be closed all the way. Culture grew out pseudomonas. Patient currently in the hospital awaiting next steps in regards to treatment   PAST MEDICAL HISTORY:   Past Medical History:  Diagnosis Date  . HOH (hard of hearing)   . Hypertension   . Hypertension     PAST SURGICAL HISTORY:   Past Surgical History:  Procedure Laterality Date  . APPLICATION OF WOUND VAC Right 07/31/2019   Procedure: Application Of Wound Vac;  Surgeon: Frederico Hamman, MD;  Location: [facility] OR;  Service: Orthopedics;  Laterality: Right;  . CLOSED REDUCTION FEMORAL SHAFT FRACTURE Left   . TOTAL KNEE ARTHROPLASTY Right 06/03/2019   Procedure: TOTAL KNEE ARTHROPLASTY, ORIF MEDIAL CONDYLE, LATERAL RELEASE;  Surgeon: Frederico Hamman, MD;  Location: [facility] ORS;  Service: Orthopedics;  Laterality: Right;  . TOTAL KNEE REVISION Right 07/31/2019   Procedure: RIGHT KNEE IRRIGATION AND DEBRIDEMENT with POLY EXCHANGE and PLACEMENT of STIMULAN BEADS;  Surgeon: Frederico Hamman, MD;  Location: [facility] OR;  Service: Orthopedics;  Laterality: Right;    MEDICATIONS:   Medications Prior to Admission  Medication Sig Dispense Refill  . chlorthalidone (HYGROTON) 25 MG tablet Take 25 mg by mouth daily.    . CVS D3 25 MCG (1000 UT) capsule Take 1,000 Units by mouth daily.    Marland Kitchen labetalol (NORMODYNE) 300 MG tablet Take 300 mg by mouth 2 (two) times daily.    Marland Kitchen losartan (COZAAR) 100 MG tablet Take 100 mg by mouth daily.    Marland Kitchen acetaminophen (TYLENOL) 325 MG tablet Take 2 tablets (650 mg total) by mouth every 4 (four) hours as  needed. (Patient not taking: Reported on 07/28/2019) 100 tablet 2    ALLERGIES:  No Known Allergies  REVIEW OF SYSTEMS:   Negative except right knee pain  FAMILY HISTORY:   Family History  Problem Relation Age of Onset  . Stroke Mother     SOCIAL HISTORY:   reports that he has never smoked. He has never used smokeless tobacco. He reports that he does not drink alcohol or use drugs.  PHYSICAL EXAM:  General appearance: alert, cooperative and no distress Extremities: right knee swollen, erythematous, minimal drainage present.    LABORATORY STUDIES: Recent Labs    08/02/19 0429 08/03/19 0311  WBC 6.7 6.1  HGB 9.2* 8.8*  HCT 27.3* 27.2*  PLT 238 241     Recent Labs    08/01/19 0145 08/02/19 1040  NA 132* 132*  K 4.1 3.7  CL 98 100  CO2 23 23  GLUCOSE 142* 117*  BUN 17 19  CREATININE 0.95 1.14  CALCIUM 9.2 9.3    STUDIES/RESULTS:  DG Knee 1-2 Views Right  Result Date: 07/30/2019 CLINICAL DATA:  Knee replacement a few months ago. Incision has never healed properly. Pain. EXAM: RIGHT KNEE - 1-2 VIEW COMPARISON:  None. FINDINGS: The patient is status post left knee replacement. Femoral and tibial components are in good position with no evidence of loosening or failure. Two screws extend through the distal femur. No joint effusion. A skin  defect is seen anteriorly on the lateral view, likely the un healed incision. No definite bony erosion. IMPRESSION: 1. An apparent skin defect is seen anteriorly on the lateral view, consistent with the unhealed incision. No definitive erosion of the underlying patella. Surgical hardware is in good position. Electronically Signed   By: Dorise Bullion III M.D   On: 07/30/2019 14:53   CT Head Wo Contrast  Result Date: 07/30/2019 CLINICAL DATA:  Fall today with head and neck pain.  Ataxia. EXAM: CT HEAD WITHOUT CONTRAST CT CERVICAL SPINE WITHOUT CONTRAST TECHNIQUE: Multidetector CT imaging of the head and cervical spine was performed following  the standard protocol without intravenous contrast. Multiplanar CT image reconstructions of the cervical spine were also generated. COMPARISON:  Head CT 06/24/2019 FINDINGS: CT HEAD FINDINGS Brain: Ventricles and cisterns are within normal. There is mild stable prominence of the CSF spaces compatible with atrophic change. Moderate chronic ischemic microvascular disease is present. Small old lacunar infarct over the left lentiform nucleus. No evidence of mass, mass effect, shift of midline structures or acute hemorrhage. No evidence of acute infarction. Vascular: No hyperdense vessel or unexpected calcification. Skull: Normal. Negative for fracture or focal lesion. Sinuses/Orbits: Orbits are normal. Hypoplastic frontal sinuses. Opacification over the sphenoid sinus slightly worse. Mastoid air cells are clear. Other: None. CT CERVICAL SPINE FINDINGS Alignment: Very subtle anterior subluxation of C5 and C6 likely degenerative in nature. No traumatic subluxation. Skull base and vertebrae: There is moderate spondylosis throughout the cervical spine. Vertebral body heights are normal. There is smooth sclerotic bordered lucency through the base of the dens separating the dens from the remainder of the C2 vertebral body compatible with an old fracture. There is uncovertebral joint spurring and moderate facet arthropathy. There is significant neural foraminal narrowing at multiple levels worse along the right side at the C3-4 and C4-5 levels. Soft tissues and spinal canal: Prevertebral soft tissues are normal. Spinal canal is unremarkable. Disc levels:  No significant disc space narrowing. Upper chest: No acute findings. Other: None. IMPRESSION: 1.  No acute brain injury. 2. Chronic ischemic microvascular disease and age related atrophic change. 3. No acute cervical spine injury. 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 4. Moderate spondylosis throughout the cervical spine with moderate bilateral neural foraminal narrowing at  multiple levels as described. 5. 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 Electronically Signed   By: Marin Olp M.D.   On: 07/30/2019 13:40   CT Cervical Spine Wo Contrast  Result Date: 07/30/2019 CLINICAL DATA:  Fall today with head and neck pain.  Ataxia. EXAM: CT HEAD WITHOUT CONTRAST CT CERVICAL SPINE WITHOUT CONTRAST TECHNIQUE: Multidetector CT imaging of the head and cervical spine was performed following the standard protocol without intravenous contrast. Multiplanar CT image reconstructions of the cervical spine were also generated. COMPARISON:  Head CT 06/24/2019 FINDINGS: CT HEAD FINDINGS Brain: Ventricles and cisterns are within normal. There is mild stable prominence of the CSF spaces compatible with atrophic change. Moderate chronic ischemic microvascular disease is present. Small old lacunar infarct over the left lentiform nucleus. No evidence of mass, mass effect, shift of midline structures or acute hemorrhage. No evidence of acute infarction. Vascular: No hyperdense vessel or unexpected calcification. Skull: Normal. Negative for fracture or focal lesion. Sinuses/Orbits: Orbits are normal. Hypoplastic frontal sinuses. Opacification over the sphenoid sinus slightly worse. Mastoid air cells are clear. Other: None. CT CERVICAL SPINE FINDINGS Alignment: Very subtle anterior subluxation of C5 and C6 likely degenerative in nature. No  traumatic subluxation. Skull base and vertebrae: There is moderate spondylosis throughout the cervical spine. Vertebral body heights are normal. There is smooth sclerotic bordered lucency through the base of the dens separating the dens from the remainder of the C2 vertebral body compatible with an old fracture. There is uncovertebral joint spurring and moderate facet arthropathy. There is significant neural foraminal narrowing at multiple levels worse along the right side at the C3-4 and C4-5 levels. Soft tissues and spinal canal: Prevertebral  soft tissues are normal. Spinal canal is unremarkable. Disc levels:  No significant disc space narrowing. Upper chest: No acute findings. Other: None. IMPRESSION: 1.  No acute brain injury. 2. Chronic ischemic microvascular disease and age related atrophic change. 3. No acute cervical spine injury. 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 4. Moderate spondylosis throughout the cervical spine with moderate bilateral neural foraminal narrowing at multiple levels as described. 5. 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 Electronically Signed   By: Elberta Fortis M.D.   On: 07/30/2019 13:40   Chest Portable 1 View  Result Date: 07/30/2019 CLINICAL DATA:  Right knee infection. EXAM: PORTABLE CHEST 1 VIEW COMPARISON:  June 28, 2019 FINDINGS: Mild to moderate severity chronic appearing increased interstitial lung markings are seen. This is stable in appearance when compared to the prior study. Very mild, stable linear scarring and/or atelectasis is seen within the left lung base. There is no evidence of a pleural effusion or pneumothorax. The cardiac silhouette is markedly enlarged and unchanged in size. Chronic fourth and fifth right rib fractures are seen. Degenerative changes seen throughout the thoracic spine. IMPRESSION: 1. Chronic appearing increased interstitial lung markings with mild, stable left basilar linear scarring and/or atelectasis. Electronically Signed   By: Aram Candela M.D.   On: 07/30/2019 18:41   [place] EKG SITE RITE  Result Date: 08/02/2019 If [facility] image not attached, placement could not be confirmed due to current cardiac rhythm.   ASSESSMENT: 2 Days Post-Op Procedure(s) (LRB): RIGHT KNEE IRRIGATION AND DEBRIDEMENT with POLY EXCHANGE and PLACEMENT of STIMULAN BEADS (Right) Application Of Wound Vac (Right)  PLAN: Continue plan per ID. I agree with plan to OR on Friday for an I&D, poly exchange, synovectomy. Flap closure by plastics.     Torien Ramroop,STEPHEN  D 08/03/2019. 8:45 AM

## 2019-08-04 LAB — CBC
HCT: 29.5 % — ABNORMAL LOW (ref 39.0–52.0)
Hemoglobin: 9.5 g/dL — ABNORMAL LOW (ref 13.0–17.0)
MCH: 30 pg (ref 26.0–34.0)
MCHC: 32.2 g/dL (ref 30.0–36.0)
MCV: 93.1 fL (ref 80.0–100.0)
Platelets: 267 10*3/uL (ref 150–400)
RBC: 3.17 MIL/uL — ABNORMAL LOW (ref 4.22–5.81)
RDW: 13.7 % (ref 11.5–15.5)
WBC: 5.8 10*3/uL (ref 4.0–10.5)
nRBC: 0.3 % — ABNORMAL HIGH (ref 0.0–0.2)

## 2019-08-04 LAB — AEROBIC/ANAEROBIC CULTURE W GRAM STAIN (SURGICAL/DEEP WOUND)

## 2019-08-04 MED ORDER — ENSURE PRE-SURGERY PO LIQD
296.0000 mL | Freq: Once | ORAL | Status: AC
  Administered 2019-08-04: 296 mL via ORAL
  Filled 2019-08-04: qty 296

## 2019-08-04 MED ORDER — METRONIDAZOLE 500 MG PO TABS
500.0000 mg | ORAL_TABLET | Freq: Three times a day (TID) | ORAL | Status: DC
  Administered 2019-08-04 – 2019-08-11 (×21): 500 mg via ORAL
  Filled 2019-08-04 (×21): qty 1

## 2019-08-04 MED ORDER — POVIDONE-IODINE 7.5 % EX SOLN: Freq: Once | CUTANEOUS | Status: DC

## 2019-08-04 MED ORDER — POVIDONE-IODINE 7.5 % EX SOLN
Freq: Once | CUTANEOUS | Status: DC
  Filled 2019-08-04: qty 118

## 2019-08-04 MED ORDER — SODIUM CHLORIDE 0.9 % IV SOLN: INTRAVENOUS | Status: DC

## 2019-08-04 NOTE — Plan of Care (Signed)
  Problem: Nutrition: Goal: Adequate nutrition will be maintained Outcome: Progressing   Problem: Pain Managment: Goal: General experience of comfort will improve Outcome: Progressing   

## 2019-08-04 NOTE — Plan of Care (Signed)
  Problem: Clinical Measurements: Goal: Will remain free from infection Outcome: Progressing   Problem: Nutrition: Goal: Adequate nutrition will be maintained Outcome: Progressing   Problem: Nutrition: Goal: Adequate nutrition will be maintained Outcome: Progressing

## 2019-08-04 NOTE — Progress Notes (Addendum)
Nutrition Follow-up  DOCUMENTATION CODES:   Not applicable  INTERVENTION:   -Continue Ensure Enlive po TID, each supplement provides 350 kcal and 20 grams of protein -Continue MVI with minerals daily  NUTRITION DIAGNOSIS:   Increased nutrient needs related to post-op healing as evidenced by estimated needs.  Ongoing  GOAL:   Patient will meet greater than or equal to 90% of their needs  Progressing  MONITOR:   PO intake, Supplement acceptance, Labs, Weight trends, Skin, I & O's  REASON FOR ASSESSMENT:   Consult Assessment of nutrition requirement/status  ASSESSMENT:   Patient is s/p RTK 06/03/2019 with Dr. Madelon Lips, he developed some skin issues acutely following surgery within the first week which in tern led to some wound necrosis and dehisence since.  He was initially treated with some po doxycycline but not currently on abx.  Since surgery postop period also complicated by hospital admission closer to his home in [place] for positive covid and pneumonia.  Family member states he has also been seen for dehydration.  They were consulted by plastic surgery 2 days ago who mentioned possiblity of skin graft pending no deep infection.  Up to this point wound has not appeared infected despite the dehisence which has seemed to progress over the last month.  We did do a knee aspiration on 06/10/19 which showed total cell count of 12,600, negative culture, no crystals.  Denies fevers, chills, sweats, they do note some wound drainage which has been persistent but not increasing.  Family member called ortho on call this AM mentioning increased right knee pain, inability to bear weight, not eating, instructed to come to [facility] for admission under Dr. Madelon Lips and likely plan for surgery.  1/31- s/p I&D of rt knee, revision of polyethylene exchange, tissue transfer, antibiotic bead placement, and application of wound vac  Reviewed I/O's: -1.1 L x 24 hours and -2.9 L since admission  UOP: 1.6 L x  24 hours  Drain output: 150 ml x 24 hours  Plan for polyethylene exchange with washout on Friday, 08/05/19 with plastics.   Spoke with pt at bedside, who was very hard of hearing, however, hears better on his left side. He reports poor oral intake during hospitalization, as he does not like the hospital food. Observed pt consuming vanilla and strawberry cake at time of visit. Pt also consumed an entire chocolate Ensure prior to RD visit. Also noted a vanilla Ensure on tray table that pt reported he ws "working on". Offered to exchange Ensure out with a fresh, cold supplement, however, pt politely declined. Meal completion documented at 25-75%.   Pt shares he had a good appetite PTA, consuming 3 meals per day (Breakfast: grits, 2 pieces of bacon, and scrambled eggs; Lunch: sandwich; Dinner: meat, starch, and vegetable from local steakhouse). Pt estimated he has lost about 5 pounds during hospitalization due to poor oral intake, however, denies any weight loss PTA ("I was eating real good"). Per wt hx, pt has experienced a 4.1% wt loss over the past 2 months, which is not significant for time frame.   Obtained food preferences from pt and entered into healthtouch ordering system. Also discussed importance of good meal and supplement intake to promote post-operative healing. Pt amenable to continue Ensure supplements.   Case discussed with RN, who confirmed above information.   Medications reviewed and include colace.   Labs reviewed: Na: 132.   NUTRITION - FOCUSED PHYSICAL EXAM:    Most Recent Value  Orbital Region  No  depletion  Upper Arm Region  Mild depletion  Thoracic and Lumbar Region  No depletion  Buccal Region  No depletion  Temple Region  No depletion  Clavicle Bone Region  No depletion  Clavicle and Acromion Bone Region  No depletion  Scapular Bone Region  No depletion  Dorsal Hand  No depletion  Patellar Region  Moderate depletion  Anterior Thigh Region  Moderate depletion   Posterior Calf Region  Moderate depletion  Edema (RD Assessment)  None  Hair  Reviewed  Eyes  Reviewed  Mouth  Reviewed  Skin  Reviewed  Nails  Reviewed       Diet Order:   Diet Order            Diet NPO time specified  Diet effective midnight        Diet regular Room service appropriate? Yes; Fluid consistency: Thin  Diet effective now              EDUCATION NEEDS:   Education needs have been addressed  Skin:  Skin Assessment: Skin Integrity Issues: Skin Integrity Issues:: Wound VAC Wound Vac: rt knee  Last BM:  Unknown  Height:   Ht Readings from Last 1 Encounters:  08/01/19 5\' 6"  (6.767 m)    Weight:   Wt Readings from Last 1 Encounters:  08/01/19 77 kg    Ideal Body Weight:  64.5 kg  BMI:  Body mass index is 27.4 kg/m.  Estimated Nutritional Needs:   Kcal:  2050-2250  Protein:  100-115 grams  Fluid:  > 2 L  Loistine Chance, RD, LDN, [place] Registered Dietitian II Certified Diabetes Care and Education Specialist Please refer to [facility] for RD and/or RD on-call/weekend/after hours pager

## 2019-08-04 NOTE — Progress Notes (Signed)
Physical Therapy Treatment Patient Details Name: Devin [name] MRN: [id number] DOB: 1936-05-21 Today's Date: 08/04/2019    History of Present Illness Pt is an 84 y/o admitted with Rt TKA necrosis with pyarthrosis s/p I&D with TKA revision. PMhx: Rt TKA 06/03/19, HTN, Covid 19 05/2019    PT Comments    Pt making steady progress overall with mobility. Will re-assess following surgery as it appears that the plastic surgeon is going to update his [place] status to TDWB R LE. Pt will likely have more difficulty and may require more physical assistance. Pt would continue to benefit from skilled physical therapy services at this time while admitted and after d/c to address the below listed limitations in order to improve overall safety and independence with functional mobility.    Follow Up Recommendations  Home health PT;Supervision/Assistance - 24 hour     Equipment Recommendations  None recommended by PT    Recommendations for Other Services       Precautions / Restrictions Precautions Precautions: Knee;Fall Precaution Comments: VAC Required Braces or Orthoses: Knee Immobilizer - Right Knee Immobilizer - Right: On at all times Restrictions Weight Bearing Restrictions: Yes RLE Weight Bearing: Weight bearing as tolerated    Mobility  Bed Mobility Overal bed mobility: Needs Assistance Bed Mobility: Supine to Sit     Supine to sit: Min guard     General bed mobility comments: min guard for safety with line management  Transfers Overall transfer level: Needs assistance Equipment used: Rolling walker (2 wheeled) Transfers: Sit to/from Stand Sit to Stand: Min assist         General transfer comment: cues for hand placement and safety; min A to power into standing and for stability with transition  Ambulation/Gait Ambulation/Gait assistance: Min guard Gait Distance (Feet): 120 Feet Assistive device: Rolling walker (2 wheeled) Gait Pattern/deviations: Step-to  pattern;Decreased stride length;Trunk flexed Gait velocity: decreased   General Gait Details: cues for safety and assistance with line management; overall steady with RW and able to tolerate WBAT R LE well with KI on   Stairs             Wheelchair Mobility    Modified Rankin (Stroke Patients Only)       Balance Overall balance assessment: Needs assistance Sitting-balance support: Feet supported Sitting balance-Leahy Scale: Fair     Standing balance support: Bilateral upper extremity supported;During functional activity Standing balance-Leahy Scale: Poor Standing balance comment: reliance on bil UE and RW                            Cognition Arousal/Alertness: Awake/alert Behavior During Therapy: WFL for tasks assessed/performed Overall Cognitive Status: Impaired/Different from baseline Area of Impairment: Attention;Memory;Safety/judgement;Following commands;Problem solving                   Current Attention Level: Selective Memory: Decreased short-term memory;Decreased recall of precautions Following Commands: Follows multi-step commands with increased time Safety/Judgement: Decreased awareness of safety;Decreased awareness of deficits   Problem Solving: Difficulty sequencing;Requires verbal cues        Exercises      General Comments        Pertinent Vitals/Pain Pain Assessment: 0-10 Pain Score: 7  Pain Location: right knee Pain Descriptors / Indicators: Aching;Sore;Guarding Pain Intervention(s): Monitored during session;Repositioned    Home Living                      Prior Function  PT Goals (current goals can now be found in the care plan section) Acute Rehab PT Goals PT Goal Formulation: With patient Time For Goal Achievement: 08/15/19 Potential to Achieve Goals: Fair Progress towards PT goals: Progressing toward goals    Frequency    Min 5X/week      PT Plan Current plan remains  appropriate    Co-evaluation              AM-PAC PT "6 Clicks" Mobility   Outcome Measure  Help needed turning from your back to your side while in a flat bed without using bedrails?: None Help needed moving from lying on your back to sitting on the side of a flat bed without using bedrails?: None Help needed moving to and from a bed to a chair (including a wheelchair)?: A Little Help needed standing up from a chair using your arms (e.g., wheelchair or bedside chair)?: A Little Help needed to walk in hospital room?: A Little Help needed climbing 3-5 steps with a railing? : A Lot 6 Click Score: 19    End of Session Equipment Utilized During Treatment: Gait belt;Right knee immobilizer Activity Tolerance: Patient tolerated treatment well Patient left: in bed;with call bell/phone within reach;with bed alarm set Nurse Communication: Mobility status;Precautions;Weight bearing status PT Visit Diagnosis: Other abnormalities of gait and mobility (R26.89);Muscle weakness (generalized) (M62.81)     Time: 8850-2774 PT Time Calculation (min) (ACUTE ONLY): 21 min  Charges:  $Gait Training: 8-22 mins                     Arletta Bale, DPT  Acute Rehabilitation Services Pager [phone] Office [phone]     Alessandra Bevels Mckennon Zwart 08/04/2019, 2:55 PM

## 2019-08-04 NOTE — Anesthesia Preprocedure Evaluation (Addendum)
Anesthesia Evaluation  Patient identified by MRN, date of birth, ID band Patient awake    Reviewed: Allergy & Precautions, NPO status , Patient's Chart, lab work & pertinent test results, reviewed documented beta blocker date and time   Airway Mallampati: I       Dental  (+) Edentulous Upper, Edentulous Lower   Pulmonary neg pulmonary ROS,    Pulmonary exam normal        Cardiovascular hypertension, Pt. on medications and Pt. on home beta blockers Normal cardiovascular exam Rhythm:Regular Rate:Normal     Neuro/Psych negative neurological ROS  negative psych ROS   GI/Hepatic negative GI ROS, Neg liver ROS,   Endo/Other  negative endocrine ROS  Renal/GU negative Renal ROS  negative genitourinary   Musculoskeletal   Abdominal Normal abdominal exam  (+)   Peds  Hematology  (+) Blood dyscrasia, anemia ,   Anesthesia Other Findings   Reproductive/Obstetrics                            Anesthesia Physical Anesthesia Plan  ASA: II  Anesthesia Plan: General   Post-op Pain Management:    Induction: Intravenous  PONV Risk Score and Plan: 2 and Ondansetron and Dexamethasone  Airway Management Planned: LMA  Additional Equipment: None  Intra-op Plan:   Post-operative Plan: Extubation in OR  Informed Consent: I have reviewed the patients History and Physical, chart, labs and discussed the procedure including the risks, benefits and alternatives for the proposed anesthesia with the patient or authorized representative who has indicated his/her understanding and acceptance.     Dental advisory given  Plan Discussed with: CRNA  Anesthesia Plan Comments:        Anesthesia Quick Evaluation

## 2019-08-04 NOTE — Progress Notes (Signed)
Subjective: 4 Days Post-Op Procedure(s) (LRB): RIGHT KNEE IRRIGATION AND DEBRIDEMENT with POLY EXCHANGE and PLACEMENT of STIMULAN BEADS (Right) Application Of Wound Vac (Right) Patient reports pain as mild and moderate.    Objective: Vital signs in last 24 hours: Temp:  [97.9 F (36.6 C)-98.3 F (36.8 C)] 98 F (36.7 C) (02/04 1423) Pulse Rate:  [70-78] 78 (02/04 1423) Resp:  [16-17] 17 (02/04 1423) BP: (134-159)/(64-77) 154/74 (02/04 1423) SpO2:  [90 %-98 %] 98 % (02/04 1423)  Intake/Output from previous day: 02/03 0701 - 02/04 0700 In: 605 [P.O.:480] Out: 1925 [Urine:1600; Drains:325] Intake/Output this shift: No intake/output data recorded.  Recent Labs    08/02/19 0429 08/03/19 0311 08/04/19 1426  HGB 9.2* 8.8* 9.5*   Recent Labs    08/03/19 0311 08/04/19 1426  WBC 6.1 5.8  RBC 2.95* 3.17*  HCT 27.2* 29.5*  PLT 241 267   Recent Labs    08/02/19 1040  NA 132*  K 3.7  CL 100  CO2 23  BUN 19  CREATININE 1.14  GLUCOSE 117*  CALCIUM 9.3   No results for input(s): LABPT, INR in the last 72 hours.  Neurovascular intact Sensation intact distally Intact pulses distally Dorsiflexion/Plantar flexion intact wound vac in place   Assessment/Plan: 4 Days Post-Op Procedure(s) (LRB): RIGHT KNEE IRRIGATION AND DEBRIDEMENT with POLY EXCHANGE and PLACEMENT of STIMULAN BEADS (Right) Application Of Wound Vac (Right)  OR tomorrow  NPO after midnight  WBAT RLE in knee brace  Pain control as ordered       Margart Sickles 08/04/2019, 5:48 PM

## 2019-08-04 NOTE — H&P (View-Only) (Signed)
Subjective: 4 Days Post-Op Procedure(s) (LRB): RIGHT KNEE IRRIGATION AND DEBRIDEMENT with POLY EXCHANGE and PLACEMENT of STIMULAN BEADS (Right) Application Of Wound Vac (Right) Patient reports pain as mild and moderate.    Objective: Vital signs in last 24 hours: Temp:  [97.9 F (36.6 C)-98.3 F (36.8 C)] 98 F (36.7 C) (02/04 1423) Pulse Rate:  [70-78] 78 (02/04 1423) Resp:  [16-17] 17 (02/04 1423) BP: (134-159)/(64-77) 154/74 (02/04 1423) SpO2:  [90 %-98 %] 98 % (02/04 1423)  Intake/Output from previous day: 02/03 0701 - 02/04 0700 In: 605 [P.O.:480] Out: 1925 [Urine:1600; Drains:325] Intake/Output this shift: No intake/output data recorded.  Recent Labs    08/02/19 0429 08/03/19 0311 08/04/19 1426  HGB 9.2* 8.8* 9.5*   Recent Labs    08/03/19 0311 08/04/19 1426  WBC 6.1 5.8  RBC 2.95* 3.17*  HCT 27.2* 29.5*  PLT 241 267   Recent Labs    08/02/19 1040  NA 132*  K 3.7  CL 100  CO2 23  BUN 19  CREATININE 1.14  GLUCOSE 117*  CALCIUM 9.3   No results for input(s): LABPT, INR in the last 72 hours.  Neurovascular intact Sensation intact distally Intact pulses distally Dorsiflexion/Plantar flexion intact wound vac in place   Assessment/Plan: 4 Days Post-Op Procedure(s) (LRB): RIGHT KNEE IRRIGATION AND DEBRIDEMENT with POLY EXCHANGE and PLACEMENT of STIMULAN BEADS (Right) Application Of Wound Vac (Right)  OR tomorrow  NPO after midnight  WBAT RLE in knee brace  Pain control as ordered       Azka Steger 08/04/2019, 5:48 PM 

## 2019-08-05 ENCOUNTER — Inpatient Hospital Stay (HOSPITAL_COMMUNITY): Payer: Medicare Other | Admitting: Anesthesiology

## 2019-08-05 ENCOUNTER — Encounter (HOSPITAL_COMMUNITY)
Admission: EM | Disposition: A | Discharge status: Home Health | Payer: Self-pay | Source: Home / Self Care | Attending: Orthopedic Surgery

## 2019-08-05 DIAGNOSIS — S81001A Unspecified open wound, right knee, initial encounter: Secondary | ICD-10-CM

## 2019-08-05 HISTORY — PX: TOTAL KNEE REVISION: SHX996

## 2019-08-05 HISTORY — PX: FREE FLAP TO EXTREMITY: SHX5356

## 2019-08-05 LAB — BASIC METABOLIC PANEL
Anion gap: 9 (ref 5–15)
BUN: 18 mg/dL (ref 8–23)
CO2: 29 mmol/L (ref 22–32)
Calcium: 9.5 mg/dL (ref 8.9–10.3)
Chloride: 98 mmol/L (ref 98–111)
Creatinine, Ser: 0.92 mg/dL (ref 0.61–1.24)
GFR calc Af Amer: >60 mL/min (ref >60)
GFR calc non Af Amer: >60 mL/min (ref >60)
Glucose, Bld: 138 mg/dL — ABNORMAL HIGH (ref 70–99)
Potassium: 3.5 mmol/L (ref 3.5–5.1)
Sodium: 136 mmol/L (ref 135–145)

## 2019-08-05 SURGERY — TOTAL KNEE REVISION
Anesthesia: General | Site: Knee | Laterality: Right

## 2019-08-05 MED ORDER — DEXAMETHASONE SODIUM PHOSPHATE 10 MG/ML IJ SOLN
INTRAMUSCULAR | Status: AC
  Filled 2019-08-05: qty 1

## 2019-08-05 MED ORDER — ALBUMIN HUMAN 5 % IV SOLN: INTRAVENOUS | Status: DC | PRN

## 2019-08-05 MED ORDER — ONDANSETRON HCL 4 MG/2ML IJ SOLN
INTRAMUSCULAR | Status: AC
  Filled 2019-08-05: qty 2

## 2019-08-05 MED ORDER — GENTAMICIN SULFATE 40 MG/ML IJ SOLN
INTRAMUSCULAR | Status: AC
  Filled 2019-08-05: qty 6

## 2019-08-05 MED ORDER — LIDOCAINE 2% (20 MG/ML) 5 ML SYRINGE
INTRAMUSCULAR | Status: AC
  Filled 2019-08-05: qty 5

## 2019-08-05 MED ORDER — GENTAMICIN SULFATE 40 MG/ML IJ SOLN
INTRAMUSCULAR | Status: DC | PRN
  Administered 2019-08-05 (×3): 80 mg via INTRAMUSCULAR

## 2019-08-05 MED ORDER — TOBRAMYCIN SULFATE 1.2 G IJ SOLR
INTRAMUSCULAR | Status: AC
  Filled 2019-08-05: qty 1.2

## 2019-08-05 MED ORDER — PROPOFOL 10 MG/ML IV BOLUS
INTRAVENOUS | Status: AC
  Filled 2019-08-05: qty 20

## 2019-08-05 MED ORDER — VANCOMYCIN HCL 1000 MG IV SOLR
INTRAVENOUS | Status: AC
  Filled 2019-08-05: qty 1000

## 2019-08-05 MED ORDER — BUPIVACAINE HCL (PF) 0.25 % IJ SOLN
INTRAMUSCULAR | Status: AC
  Filled 2019-08-05: qty 30

## 2019-08-05 MED ORDER — PROPOFOL 10 MG/ML IV BOLUS
INTRAVENOUS | Status: DC | PRN
  Administered 2019-08-05: 130 mg via INTRAVENOUS

## 2019-08-05 MED ORDER — EPINEPHRINE PF 1 MG/ML IJ SOLN
INTRAMUSCULAR | Status: DC | PRN
  Administered 2019-08-05: 1 mg

## 2019-08-05 MED ORDER — EPHEDRINE 5 MG/ML INJ
INTRAVENOUS | Status: AC
  Filled 2019-08-05: qty 10

## 2019-08-05 MED ORDER — PHENYLEPHRINE 40 MCG/ML (10ML) SYRINGE FOR IV PUSH (FOR BLOOD PRESSURE SUPPORT)
PREFILLED_SYRINGE | INTRAVENOUS | Status: AC
  Filled 2019-08-05: qty 10

## 2019-08-05 MED ORDER — BUPIVACAINE HCL (PF) 0.25 % IJ SOLN
INTRAMUSCULAR | Status: DC | PRN
  Administered 2019-08-05: 160 mL

## 2019-08-05 MED ORDER — PHENYLEPHRINE HCL (PRESSORS) 10 MG/ML IV SOLN
INTRAVENOUS | Status: AC
  Filled 2019-08-05: qty 1

## 2019-08-05 MED ORDER — LIDOCAINE-EPINEPHRINE 1 %-1:100000 IJ SOLN
INTRAMUSCULAR | Status: AC
  Filled 2019-08-05: qty 1

## 2019-08-05 MED ORDER — EPHEDRINE SULFATE-NACL 50-0.9 MG/10ML-% IV SOSY
PREFILLED_SYRINGE | INTRAVENOUS | Status: DC | PRN
  Administered 2019-08-05 (×2): 5 mg via INTRAVENOUS
  Administered 2019-08-05 (×3): 10 mg via INTRAVENOUS

## 2019-08-05 MED ORDER — DEXAMETHASONE SODIUM PHOSPHATE 4 MG/ML IJ SOLN
INTRAMUSCULAR | Status: DC | PRN
  Administered 2019-08-05: 5 mg via INTRAVENOUS

## 2019-08-05 MED ORDER — LACTATED RINGERS IV SOLN: INTRAVENOUS | Status: DC | PRN

## 2019-08-05 MED ORDER — EPINEPHRINE PF 1 MG/ML IJ SOLN
INTRAMUSCULAR | Status: AC
  Filled 2019-08-05: qty 1

## 2019-08-05 MED ORDER — SODIUM CHLORIDE 0.9 % IV SOLN
2.0000 g | Freq: Three times a day (TID) | INTRAVENOUS | Status: DC
  Administered 2019-08-06 – 2019-08-11 (×18): 2 g via INTRAVENOUS
  Filled 2019-08-05 (×18): qty 2

## 2019-08-05 MED ORDER — ONDANSETRON HCL 4 MG/2ML IJ SOLN: 4.0000 mg | Freq: Once | INTRAMUSCULAR | Status: DC | PRN

## 2019-08-05 MED ORDER — MIDAZOLAM HCL 2 MG/2ML IJ SOLN
INTRAMUSCULAR | Status: AC
  Filled 2019-08-05: qty 2

## 2019-08-05 MED ORDER — PHENYLEPHRINE 40 MCG/ML (10ML) SYRINGE FOR IV PUSH (FOR BLOOD PRESSURE SUPPORT)
PREFILLED_SYRINGE | INTRAVENOUS | Status: DC | PRN
  Administered 2019-08-05: 120 ug via INTRAVENOUS
  Administered 2019-08-05: 40 ug via INTRAVENOUS
  Administered 2019-08-05: 120 ug via INTRAVENOUS

## 2019-08-05 MED ORDER — ACETAMINOPHEN 10 MG/ML IV SOLN
1000.0000 mg | Freq: Once | INTRAVENOUS | Status: DC | PRN
  Administered 2019-08-05: 13:00:00 1000 mg via INTRAVENOUS

## 2019-08-05 MED ORDER — VASOPRESSIN 20 UNIT/ML IV SOLN
INTRAVENOUS | Status: AC
  Filled 2019-08-05: qty 1

## 2019-08-05 MED ORDER — 0.9 % SODIUM CHLORIDE (POUR BTL) OPTIME
TOPICAL | Status: DC | PRN
  Administered 2019-08-05: 1000 mL

## 2019-08-05 MED ORDER — SODIUM CHLORIDE 0.9 % IR SOLN
Status: DC | PRN
  Administered 2019-08-05 (×3): 3000 mL

## 2019-08-05 MED ORDER — FENTANYL CITRATE (PF) 100 MCG/2ML IJ SOLN
INTRAMUSCULAR | Status: AC
  Administered 2019-08-05: 13:00:00 50 ug via INTRAVENOUS
  Filled 2019-08-05: qty 2

## 2019-08-05 MED ORDER — MINERAL OIL LIGHT 100 % EX OIL
TOPICAL_OIL | CUTANEOUS | Status: DC | PRN
  Administered 2019-08-05: 1 via TOPICAL

## 2019-08-05 MED ORDER — LIDOCAINE 2% (20 MG/ML) 5 ML SYRINGE
INTRAMUSCULAR | Status: DC | PRN
  Administered 2019-08-05: 80 mg via INTRAVENOUS

## 2019-08-05 MED ORDER — FENTANYL CITRATE (PF) 250 MCG/5ML IJ SOLN
INTRAMUSCULAR | Status: AC
  Filled 2019-08-05: qty 5

## 2019-08-05 MED ORDER — FENTANYL CITRATE (PF) 100 MCG/2ML IJ SOLN: 25.0000 ug | INTRAMUSCULAR | Status: DC | PRN

## 2019-08-05 MED ORDER — SODIUM CHLORIDE (PF) 0.9 % IJ SOLN
INTRAMUSCULAR | Status: DC | PRN
  Administered 2019-08-05: 500 mL

## 2019-08-05 MED ORDER — VANCOMYCIN HCL 1000 MG IV SOLR
INTRAVENOUS | Status: DC | PRN
  Administered 2019-08-05: 1000 mg

## 2019-08-05 MED ORDER — PHENYLEPHRINE HCL-NACL 10-0.9 MG/250ML-% IV SOLN
INTRAVENOUS | Status: DC | PRN
  Administered 2019-08-05: 50 ug/min via INTRAVENOUS
  Administered 2019-08-05: 125 ug/min via INTRAVENOUS

## 2019-08-05 MED ORDER — VASOPRESSIN 20 UNIT/ML IV SOLN
INTRAVENOUS | Status: DC | PRN
  Administered 2019-08-05: 1 [IU] via INTRAVENOUS

## 2019-08-05 MED ORDER — FENTANYL CITRATE (PF) 100 MCG/2ML IJ SOLN
INTRAMUSCULAR | Status: DC | PRN
  Administered 2019-08-05: 25 ug via INTRAVENOUS
  Administered 2019-08-05: 100 ug via INTRAVENOUS
  Administered 2019-08-05: 50 ug via INTRAVENOUS

## 2019-08-05 MED ORDER — ACETAMINOPHEN 10 MG/ML IV SOLN
INTRAVENOUS | Status: AC
  Filled 2019-08-05: qty 100

## 2019-08-05 MED ORDER — ONDANSETRON HCL 4 MG/2ML IJ SOLN: INTRAMUSCULAR | Status: DC | PRN

## 2019-08-05 MED ORDER — MEPERIDINE HCL 25 MG/ML IJ SOLN: 6.2500 mg | INTRAMUSCULAR | Status: DC | PRN

## 2019-08-05 SURGICAL SUPPLY — 84 items
APPLIER CLIP 11 MED OPEN (CLIP) ×3
APPLIER CLIP 9.375 SM OPEN (CLIP) ×3
ATTUNE PSRP INSR SZ6 5 KNEE (Insert) ×1 IMPLANT
BIOPATCH RED 1 DISK 7.0 (GAUZE/BANDAGES/DRESSINGS) ×2 IMPLANT
BLADE DERMATOME SS (BLADE) ×1 IMPLANT
BNDG COHESIVE 6X5 TAN STRL LF (GAUZE/BANDAGES/DRESSINGS) ×1 IMPLANT
BNDG CONFORM 2 STRL LF (GAUZE/BANDAGES/DRESSINGS) IMPLANT
BNDG ELASTIC 4X5.8 VLCR STR LF (GAUZE/BANDAGES/DRESSINGS) ×2 IMPLANT
BNDG GAUZE ELAST 4 BULKY (GAUZE/BANDAGES/DRESSINGS) IMPLANT
CANISTER WOUNDNEG PRESSURE 500 (CANNISTER) ×1 IMPLANT
CLIP APPLIE 11 MED OPEN (CLIP) IMPLANT
CLIP APPLIE 9.375 SM OPEN (CLIP) IMPLANT
CORD BIPOLAR FORCEPS 12FT (ELECTRODE) IMPLANT
COVER SURGICAL LIGHT HANDLE (MISCELLANEOUS) ×6 IMPLANT
COVER WAND RF STERILE (DRAPES) ×4 IMPLANT
CUFF TOURN SGL QUICK 18X4 (TOURNIQUET CUFF) IMPLANT
CUFF TOURN SGL QUICK 24 (TOURNIQUET CUFF)
CUFF TOURN SGL QUICK 34 (TOURNIQUET CUFF) ×1
CUFF TOURN SGL QUICK 42 (TOURNIQUET CUFF) IMPLANT
CUFF TRNQT CYL 24X4X16.5-23 (TOURNIQUET CUFF) IMPLANT
CUFF TRNQT CYL 34X4.125X (TOURNIQUET CUFF) IMPLANT
DERMACARRIERS GRAFT 1 TO 1.5 (DISPOSABLE) ×3
DRAIN CHANNEL 15F RND FF W/TCR (WOUND CARE) ×1 IMPLANT
DRAPE IMP U-DRAPE 54X76 (DRAPES) ×3 IMPLANT
DRAPE INCISE IOBAN 66X45 STRL (DRAPES) IMPLANT
DRSG ADAPTIC 3X8 NADH LF (GAUZE/BANDAGES/DRESSINGS) ×1 IMPLANT
DRSG CALCIUM ALGINATE 4X4 (GAUZE/BANDAGES/DRESSINGS) ×2 IMPLANT
DRSG MEPITEL 4X7.2 (GAUZE/BANDAGES/DRESSINGS) ×1 IMPLANT
DRSG OPSITE POSTOP 4X8 (GAUZE/BANDAGES/DRESSINGS) ×1 IMPLANT
DRSG VAC ATS MED SENSATRAC (GAUZE/BANDAGES/DRESSINGS) ×1 IMPLANT
EVACUATOR SILICONE 100CC (DRAIN) ×1 IMPLANT
FACESHIELD WRAPAROUND (MASK) ×6 IMPLANT
FACESHIELD WRAPAROUND OR TEAM (MASK) IMPLANT
GAUZE 4X4 16PLY RFD (DISPOSABLE) ×1 IMPLANT
GAUZE SPONGE 4X4 12PLY STRL (GAUZE/BANDAGES/DRESSINGS) ×2 IMPLANT
GAUZE SPONGE 4X4 12PLY STRL LF (GAUZE/BANDAGES/DRESSINGS) ×1 IMPLANT
GAUZE XEROFORM 1X8 LF (GAUZE/BANDAGES/DRESSINGS) ×3 IMPLANT
GLOVE BIO SURGEON STRL SZ8 (GLOVE) ×3 IMPLANT
GLOVE BIOGEL M STRL SZ7.5 (GLOVE) ×4 IMPLANT
GLOVE BIOGEL PI IND STRL 8 (GLOVE) ×8 IMPLANT
GLOVE BIOGEL PI INDICATOR 8 (GLOVE) ×4
GLOVE ORTHO TXT STRL SZ7.5 (GLOVE) ×9 IMPLANT
GLOVE SURG ORTHO 8.0 STRL STRW (GLOVE) ×9 IMPLANT
GOWN STRL REUS W/ TWL LRG LVL3 (GOWN DISPOSABLE) ×4 IMPLANT
GOWN STRL REUS W/TWL 2XL LVL3 (GOWN DISPOSABLE) ×3 IMPLANT
GOWN STRL REUS W/TWL LRG LVL3 (GOWN DISPOSABLE) ×2
GRAFT DERMACARRIERS 1 TO 1.5 (DISPOSABLE) IMPLANT
KIT BASIN OR (CUSTOM PROCEDURE TRAY) ×3 IMPLANT
KIT STIMULAN RAPID CURE  10CC (Orthopedic Implant) ×1 IMPLANT
KIT STIMULAN RAPID CURE 10CC (Orthopedic Implant) IMPLANT
KIT TURNOVER KIT B (KITS) ×6 IMPLANT
MANIFOLD NEPTUNE II (INSTRUMENTS) ×3 IMPLANT
MARKER SKIN DUAL TIP RULER LAB (MISCELLANEOUS) ×1 IMPLANT
NDL HYPO 25GX1X1/2 BEV (NEEDLE) IMPLANT
NDL SPNL 18GX3.5 QUINCKE PK (NEEDLE) IMPLANT
NEEDLE HYPO 25GX1X1/2 BEV (NEEDLE) IMPLANT
NEEDLE SPNL 18GX3.5 QUINCKE PK (NEEDLE) ×3 IMPLANT
NS IRRIG 1000ML POUR BTL (IV SOLUTION) ×5 IMPLANT
PACK ORTHO EXTREMITY (CUSTOM PROCEDURE TRAY) ×3 IMPLANT
PACK UNIVERSAL I (CUSTOM PROCEDURE TRAY) ×3 IMPLANT
PAD ABD 8X10 STRL (GAUZE/BANDAGES/DRESSINGS) IMPLANT
PAD ARMBOARD 7.5X6 YLW CONV (MISCELLANEOUS) ×9 IMPLANT
PAD CAST 4YDX4 CTTN HI CHSV (CAST SUPPLIES) IMPLANT
PADDING CAST COTTON 4X4 STRL (CAST SUPPLIES)
SET CYSTO W/LG BORE CLAMP LF (SET/KITS/TRAYS/PACK) IMPLANT
SOL PREP POV-IOD 4OZ 10% (MISCELLANEOUS) ×5 IMPLANT
SPONGE LAP 18X18 RF (DISPOSABLE) ×4 IMPLANT
SPONGE LAP 4X18 RFD (DISPOSABLE) ×3 IMPLANT
STAPLER VISISTAT 35W (STAPLE) ×3 IMPLANT
STOCKINETTE IMPERVIOUS LG (DRAPES) ×1 IMPLANT
SUT ETHILON 2 0 PSLX (SUTURE) ×2 IMPLANT
SUT ETHILON 3 0 PS 1 (SUTURE) ×2 IMPLANT
SUT PDS AB 1 CT1 36 (SUTURE) ×2 IMPLANT
SUT PDS AB 2-0 CT1 27 (SUTURE) ×3 IMPLANT
SUT PDS AB 3-0 SH 27 (SUTURE) ×3 IMPLANT
SUT VIC AB 4-0 PS2 18 (SUTURE) IMPLANT
SUT VLOC 180 0 24IN GS25 (SUTURE) ×2 IMPLANT
SWAB CULTURE ESWAB REG 1ML (MISCELLANEOUS) IMPLANT
SYR CONTROL 10ML LL (SYRINGE) IMPLANT
TOWEL GREEN STERILE (TOWEL DISPOSABLE) ×3 IMPLANT
TOWEL GREEN STERILE FF (TOWEL DISPOSABLE) ×3 IMPLANT
TUBE CONNECTING 12X1/4 (SUCTIONS) ×2 IMPLANT
WATER STERILE IRR 1000ML POUR (IV SOLUTION) ×4 IMPLANT
YANKAUER SUCT BULB TIP NO VENT (SUCTIONS) ×3 IMPLANT

## 2019-08-05 NOTE — Progress Notes (Signed)
PHARMACY NOTE:  ANTIMICROBIAL RENAL DOSAGE ADJUSTMENT  Current antimicrobial regimen includes a mismatch between antimicrobial dosage and estimated renal function.  As per policy approved by the Pharmacy & Therapeutics and Medical Executive Committees, the antimicrobial dosage will be adjusted accordingly.  Current antimicrobial dosage:  Cefepime 2gm IV q12h  Indication: wound infection  Renal Function:  Estimated Creatinine Clearance: 61.5 mL/min (by C-G formula based on SCr of 0.92 mg/dL). []      On intermittent HD, scheduled: []      On CRRT    Antimicrobial dosage has been changed to:  Cefepime 2gm IV q8h  Additional comments:   Hiroyuki Ozanich A. , PharmD, BCPS, FNKF Clinical Pharmacist [place] Please utilize Amion for appropriate phone number to reach the unit pharmacist [facility])   08/05/2019 2:03 PM

## 2019-08-05 NOTE — Transfer of Care (Signed)
Immediate Anesthesia Transfer of Care Note  Patient: Devin [name]  Procedure(s) Performed: TOTAL KNEE REVISION   INCISION AND DRAINAGE WITH POLY EXCHANGE (Right Knee) GASTROC FLAP WITH THICKNESS SKINGRAFT TO RIGHT KNEE (Right )  Patient Location: PACU  Anesthesia Type:General  Level of Consciousness: awake and alert   Airway & Oxygen Therapy: Patient Spontanous Breathing and Patient connected to face mask oxygen  Post-op Assessment: Report given to RN, Post -op Vital signs reviewed and stable and Patient moving all extremities X 4  Post vital signs: Reviewed and stable  Last Vitals:  Vitals Value Taken Time  BP    Temp    Pulse    Resp    SpO2      Last Pain:  Vitals:   08/05/19 0358  TempSrc: Oral  PainSc:       Patients Stated Pain Goal: 3 (08/03/19 0418)  Complications: No apparent anesthesia complications

## 2019-08-05 NOTE — Interval H&P Note (Signed)
History and Physical Interval Note:  08/05/2019 7:24 AM  Renee Rival  has presented today for surgery, with the diagnosis of RIGHT TOTAL KNEE INFECTION.  The various methods of treatment have been discussed with the patient and family. After consideration of risks, benefits and other options for treatment, the patient has consented to  Procedure(s): TOTAL KNEE REVISION   INCISION AND DRAINAGE WITH POLY EXCHANGE (Right) GASTROC FLAP WITH THICKNESS SKINGRAFT TO RIGHT KNEE (Right) as a surgical intervention.  The patient's history has been reviewed, patient examined, no change in status, stable for surgery.  I have reviewed the patient's chart and labs.  Questions were answered to the patient's satisfaction.     Allena Napoleon

## 2019-08-05 NOTE — Op Note (Signed)
NAME: Devin, [name] MEDICAL RECORD ZO:[id number] ACCOUNT [id number] DATE OF BIRTH:1935/11/26 FACILITY: [facility] LOCATION: [facility] PHYSICIAN:W. Vernie Piet JR., MD  OPERATIVE REPORT  DATE OF PROCEDURE:  08/05/2019  PREOPERATIVE DIAGNOSIS:  Pyarthrosis right knee, status post  right total knee replacement.  POSTOPERATIVE DIAGNOSIS:  Pyarthrosis right knee, status post  right total knee replacement.  OPERATIONS: 1.  Incision and drainage, right knee with arthrotomy. 2.  Poly exchange with revision polyethylene.   3.  Insertion of Stimulan beads.  SURGEON:  Marcie Mowers, MD  ASSISTANT:  Lonia Chimera.  ANESTHESIA:  General.  TOURNIQUET:  No tourniquet.  DESCRIPTION OF PROCEDURE:  The patient had a VAC removed that had been placed within the last 6-5 days.  He had suffered a necrotic wound with superimposed infection.  With I and D, there was a defect left relative to the patellar tendon closure  requiring plastics intervention.  This above procedure dictated in this note is part of a state as part of a combined procedure with Dr. Steffanie Dunn and plastic surgery, who will dictate the secondary portion related to his reconstructive procedure.  We  opened the capsular closure with the VAC.  The wound looked like there was early granulation tissue.  Particularly superiorly, there was some partial necrosis of the patellar tendon,  estimated about half the patellar tendon was intact.  Minor  debridement was carried out of necrotic tissue which was minimal, followed by pulsatile lavage with 6000-8000 mL of pulsatile lavage.  Prior to this, we removed the poly and irrigated the posterior aspect of the knee as well.  We then replaced the poly  after re-prepping the leg with a new sterile poly.  We then placed the Stimulan beads with a gram of vancomycin with gentamicin as well into the suprapatellar area.  Closure of the capsular structures was possible about to the midportion of the  patella  and then the skin was closed and the superior one-third of the incision.  At that point, Dr. Steffanie Dunn entered the surgery to proceed with the planned gastroc flap and skin graft.  VN/NUANCE  D:08/05/2019 T:08/05/2019 JOB:[id number]

## 2019-08-05 NOTE — Anesthesia Procedure Notes (Signed)
Procedure Name: LMA Insertion Date/Time: 08/05/2019 7:46 AM Performed by: Laruth Bouchard., CRNA Pre-anesthesia Checklist: Patient identified, Emergency Drugs available, Suction available, Patient being monitored and Timeout performed Patient Re-evaluated:Patient Re-evaluated prior to induction Oxygen Delivery Method: Circle system utilized Preoxygenation: Pre-oxygenation with 100% oxygen Induction Type: IV induction LMA: LMA inserted LMA Size: 4.0 Number of attempts: 1 Placement Confirmation: positive ETCO2 and breath sounds checked- equal and bilateral Tube secured with: Tape Dental Injury: Teeth and Oropharynx as per pre-operative assessment

## 2019-08-05 NOTE — Op Note (Signed)
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  Right knee soft tissue reconstruction with pedicled medial gastrocnemius flap. 2.  Complex closure right knee skin and soft tissue measuring 5 cm 3.  Debridement of right knee skin and subcutaneous tissue totaling 5 x 3 cm 4.  Split-thickness skin graft to right knee totaling 13 x 7 cm 5.  Wound VAC application right knee totaling 25 x 7 cm  SURGEON: Ancil Linsey, MD  ASSISTANT: Zadie Cleverly, PA The advanced practice practitioner (APP) assisted throughout the case.  The APP was essential in retraction and counter traction when needed to make the case progress smoothly.  This retraction and assistance made it possible to see the tissue plans for the procedure.  The assistance was needed for blood control, tissue re-approximation and assisted with closure of the incision site.  ANESTHESIA:  General.   COMPLICATIONS: None.   INDICATIONS FOR PROCEDURE:  The patient, Devin [name] is a 84 y.o. male born on 1936-05-12, is here for treatment of an exposed right knee prosthesis.  He is 2 months out from a total knee arthroplasty.  This was complicated by infection with Pseudomonas.  He has had 2 washouts and exchanges of the polyethylene components of the knee prosthetic.  He has been on antibiotics.  He has required debridement of the knee capsule and a portion of the patellar tendon.  He has exposed patella, patellar tendon, proximal tibia and joint space.  We went over multiple scenarios and ultimately decided that soft tissue coverage at this point gives him the best chance of functional lower extremity.  The patient and his family are aware of the severity of the situation and that this is a complex problem. MRN: [id number]  CONSENT:  Informed consent was  obtained directly from the patient. Risks, benefits and alternatives were fully discussed. Specific risks including but not limited to bleeding, infection, hematoma, seroma, scarring, pain, contracture, asymmetry, wound healing problems, and need for further surgery were all discussed.  We discussed the potential for flap failure.  We also discussed the challenging in salvaging his lower extremity should this effort fail.  The patient did have an ample opportunity to have questions answered to satisfaction.   DESCRIPTION OF PROCEDURE:  The patient was taken to the operating room. SCDs were placed and antibiotics were given.  General anesthesia was administered.  The patient's operative site was prepped and draped in a sterile fashion. A time out was performed and all information was confirmed to be correct.  Dr. Madelon Lips performed his portion of the procedure.  This included a washout, debridement, and exchange of the polyethylene components of the prosthetic.  He then closed as much of the joint space as was possible and turned the patient over to me for soft tissue reconstruction.  I started by making a curvilinear incision 2 to 3 fingerbreadths posterior to the tibia along the medial aspect of the lower extremity.  I dissected sharply down to the muscle fascia and divided this with cautery.  Blunt dissection was used to elevate the fascia off the posterior body of the gastroc.  This was carried out until the midline raphae was identified with the sural nerve.  I was also then able to bluntly identify the plane anterior to the gastroc and developed this bluntly as well.  There was a couple  perforating vessels coming through the soleus into the gastroc and these were divided between clips.  The plantaris tendon was identified confirming the correct plane of dissection.  I then came across the distal aspect of the gastrocnemius tendon for the medial head taking 1 to 2 cm of this tendon to aid with inset.  The  plane between the medial and lateral heads was then divided freeing up the muscle for rotation.  I then used mostly blunt dissection to free up the muscle proximally to the level of the knee joint.  The muscle appeared healthy and perfused.  A subcutaneous plane was then developed to allow rotation of the flap.  The tendinous anterior surface of the gastroc was scored with a 10 blade to help extended its reach and width.  A 15 French drain was then placed in the calf and secured with a nylon suture.  The posterior incision was then closed with interrupted buried 3-0 PDS sutures and staples.  The muscle was then rotated into position and secured across the defect using interrupted 3-0 PDS sutures.  This gave a nice coverage of the knee joint, the prosthetic, and most of the patella.  I then debrided some obviously nonviable skin along the incision line on both sides in 3 places around the knee joint.  This was done sharply with scissors.  I was then able to close some of the soft tissue superior and inferior to the joint using interrupted buried 3-0 PDS sutures and interrupted nylon mattress sutures.  This was a length of about 5 cm.  This left a defect of exposed gastrocnemius muscle that totaled 13 x 7 cm.  Tumescent solution was then injected in the right proximal lateral thigh.  An appropriately sized area was marked and a split-thickness skin graft was harvested with a dermatome at a depth of 14,000th inch.  The graft was then meshed 1.5-1.  It was then stapled in place.  Mepitel was placed over the skin graft followed by wound VAC using black sponge.  The size of the wound VAC application was 25 x 7 cm as it included the proximal incision.  This was then hooked up and found to have a good seal.  The leg was then wrapped with a soft dressing and placed in a knee immobilizer.  The donor site was dressed with calcium alginate and an OpSite.  The patient had a palpable DP and popliteal pulse at the end of the  case.  The patient tolerated the procedure well.  There were no complications. The patient was allowed to wake from anesthesia, extubated and taken to the recovery room in satisfactory condition.

## 2019-08-05 NOTE — Interval H&P Note (Signed)
History and Physical Interval Note:  08/05/2019 7:29 AM  Devin [name]  has presented today for surgery, with the diagnosis of RIGHT TOTAL KNEE INFECTION.  The various methods of treatment have been discussed with the patient and family. After consideration of risks, benefits and other options for treatment, the patient has consented to  Procedure(s): TOTAL KNEE REVISION   INCISION AND DRAINAGE WITH POLY EXCHANGE (Right) GASTROC FLAP WITH THICKNESS SKINGRAFT TO RIGHT KNEE (Right) as a surgical intervention.  The patient's history has been reviewed, patient examined, no change in status, stable for surgery.  I have reviewed the patient's chart and labs.  Questions were answered to the patient's satisfaction.     Thera Flake

## 2019-08-05 NOTE — Progress Notes (Signed)
1330 Received pt from PACU, A&O x3, sleepy. Denies pain at this time. RLE with ace wrap dry and intact, JP drain and wound vac in place. Right knee immobilizer on. Right toes are warm and mobile.

## 2019-08-05 NOTE — Brief Op Note (Signed)
08/05/2019  5:01 PM  PATIENT:  Devin [name]  84 y.o. male  PRE-OPERATIVE DIAGNOSIS:  RIGHT TOTAL KNEE INFECTION  POST-OPERATIVE DIAGNOSIS:  RIGHT TOTAL KNEE INFECTION  PROCEDURE:  Procedure(s): TOTAL KNEE REVISION   INCISION AND DRAINAGE WITH POLY EXCHANGE (Right) GASTROC FLAP WITH THICKNESS SKINGRAFT TO RIGHT KNEE (Right)  SURGEON:  Surgeon(s) and Role: Panel 1:    Frederico Hamman, MD - Primary Panel 2:    * Allena Napoleon, MD - Primary  PHYSICIAN ASSISTANT: Materials engineer, PA  ASSISTANTS: none   ANESTHESIA:   none  EBL:  150 mL   BLOOD ADMINISTERED:none  DRAINS:JP  LOCAL MEDICATIONS USED:  MARCAINE     SPECIMEN:  No Specimen  DISPOSITION OF SPECIMEN:  N/A  COUNTS:  YES  TOURNIQUET:  * No tourniquets in log *  DICTATION: .Dragon Dictation  PLAN OF CARE: Admit to inpatient   PATIENT DISPOSITION:  PACU - hemodynamically stable.   Delay start of Pharmacological VTE agent (>24hrs) due to surgical blood loss or risk of bleeding: not applicable

## 2019-08-06 LAB — CBC
HCT: 26.3 % — ABNORMAL LOW (ref 39.0–52.0)
Hemoglobin: 8.4 g/dL — ABNORMAL LOW (ref 13.0–17.0)
MCH: 30 pg (ref 26.0–34.0)
MCHC: 31.9 g/dL (ref 30.0–36.0)
MCV: 93.9 fL (ref 80.0–100.0)
Platelets: 240 K/uL (ref 150–400)
RBC: 2.8 MIL/uL — ABNORMAL LOW (ref 4.22–5.81)
RDW: 13.8 % (ref 11.5–15.5)
WBC: 9.3 K/uL (ref 4.0–10.5)
nRBC: 0 % (ref 0.0–0.2)

## 2019-08-06 LAB — PREPARE RBC (CROSSMATCH)

## 2019-08-06 LAB — BASIC METABOLIC PANEL
Anion gap: 8 (ref 5–15)
BUN: 19 mg/dL (ref 8–23)
CO2: 28 mmol/L (ref 22–32)
Calcium: 9.1 mg/dL (ref 8.9–10.3)
Chloride: 97 mmol/L — ABNORMAL LOW (ref 98–111)
Creatinine, Ser: 0.9 mg/dL (ref 0.61–1.24)
GFR calc Af Amer: >60 mL/min (ref >60)
GFR calc non Af Amer: >60 mL/min (ref >60)
Glucose, Bld: 158 mg/dL — ABNORMAL HIGH (ref 70–99)
Potassium: 3.9 mmol/L (ref 3.5–5.1)
Sodium: 133 mmol/L — ABNORMAL LOW (ref 135–145)

## 2019-08-06 MED ORDER — SODIUM CHLORIDE 0.9% IV SOLUTION: Freq: Once | INTRAVENOUS | Status: AC

## 2019-08-06 MED ORDER — FUROSEMIDE 10 MG/ML IJ SOLN
20.0000 mg | Freq: Once | INTRAMUSCULAR | Status: AC
  Administered 2019-08-06: 22:00:00 20 mg via INTRAVENOUS
  Filled 2019-08-06: qty 2

## 2019-08-06 NOTE — Progress Notes (Signed)
Pharmacy Antibiotic Note  Devin [name] is a 84 y.o. male admitted on 07/30/2019 with septic right total knee arthroplasty w/ gangrenous wound dehisence over patella / patella tendon. Patient is 2 months s/p right total knee arthroplasty.  Pharmacy has been consulted for cefepime dosing.   Patient growing pan-S Pseudomonas from wound cultures and is currently planned to be on cefepime until 09/16/19 s/p return to OR on 08/05/19.  Plan:  Cefepime 2gm IV q8h Metronidazole 500mg  PO q8h Continue to monitor renal function for need to adjust dosing interval   Temp (24hrs), Avg:97.9 F (36.6 C), Min:97.8 F (36.6 C), Max:98.5 F (36.9 C)  Recent Labs  Lab 08/01/19 0145 08/02/19 0429 08/02/19 1040 08/03/19 0311 08/04/19 1426 08/05/19 0403 08/06/19 0309  WBC 10.2 6.7  --  6.1 5.8  --  9.3  CREATININE 0.95  --  1.14  --   --  0.92 0.90    Estimated Creatinine Clearance: 62.9 mL/min (by C-G formula based on SCr of 0.9 mg/dL).    No Known Allergies  Antimicrobials this admission: 1/31 Vancomycin >>2/1  1/31 Ceftriaxone >> 1/31  1/31 Cefepime >> (3/19) 2/4 Flagyl>>   Dose adjustments this admission: - Cefepime 2g q12h> q8h for improved renal function while inpatient (remains q12h on OPAT orders per ID recs, given he is borderline CrCl for dosing interval change, and fluid intake will likely decrease on discharge)   Microbiology results: 1/30 wound right knee >> rare WBC, abundant GNRs, few GPC in clusters; now ABUNDANT PSEUDOMONAS AERUGINOSA 1/30 synovial right knee body fluid >> pseudomonas 1/31 surgical deep wound tissue culture > pseudomonas   2/31, PharmD PGY2 Pharmacy Resident Phone [phone]  08/06/2019   1:48 PM

## 2019-08-06 NOTE — Progress Notes (Signed)
Subjective: 1 Day Post-Op Procedure(s) (LRB): TOTAL KNEE REVISION   INCISION AND DRAINAGE WITH POLY EXCHANGE (Right) GASTROC FLAP WITH THICKNESS SKINGRAFT TO RIGHT KNEE (Right) Patient reports pain as 3 on 0-10 scale.    Objective: Vital signs in last 24 hours: Temp:  [97.8 F (36.6 C)-98.5 F (36.9 C)] 98.5 F (36.9 C) (02/06 1305) Pulse Rate:  [85-104] 98 (02/06 1305) Resp:  [14-18] 16 (02/06 1305) BP: (133-182)/(54-93) 138/54 (02/06 1305) SpO2:  [91 %-97 %] 97 % (02/06 1305)  Intake/Output from previous day: 02/05 0701 - 02/06 0700 In: 4095.1 [P.O.:840; I.V.:2805.1; IV Piggyback:450] Out: 2275 [Urine:1825; Drains:300; Blood:150] Intake/Output this shift: No intake/output data recorded.  Recent Labs    08/04/19 1426 08/06/19 0309  HGB 9.5* 8.4*   Recent Labs    08/04/19 1426 08/06/19 0309  WBC 5.8 9.3  RBC 3.17* 2.80*  HCT 29.5* 26.3*  PLT 267 240   Recent Labs    08/05/19 0403 08/06/19 0309  NA 136 133*  K 3.5 3.9  CL 98 97*  CO2 29 28  BUN 18 19  CREATININE 0.92 0.90  GLUCOSE 138* 158*  CALCIUM 9.5 9.1   No results for input(s): LABPT, INR in the last 72 hours.  ABD soft Neurovascular intact Sensation intact distally Incision: dressing C/D/I   Assessment/Plan: 1 Day Post-Op Procedure(s) (LRB): TOTAL KNEE REVISION   INCISION AND DRAINAGE WITH POLY EXCHANGE (Right) GASTROC FLAP WITH THICKNESS SKINGRAFT TO RIGHT KNEE (Right) Advance diet  Transfuse 2 units of PRBCs due to post operative blood loss anemia.  Discuss with the patient and Devin [name]'s PA  All in agreement with the plan   Will recheck labs in the am     Devin [name] Devin [name] 08/06/2019, 2:51 PM

## 2019-08-07 LAB — BASIC METABOLIC PANEL
Anion gap: 12 (ref 5–15)
BUN: 23 mg/dL (ref 8–23)
CO2: 29 mmol/L (ref 22–32)
Calcium: 9.1 mg/dL (ref 8.9–10.3)
Chloride: 93 mmol/L — ABNORMAL LOW (ref 98–111)
Creatinine, Ser: 0.85 mg/dL (ref 0.61–1.24)
GFR calc Af Amer: >60 mL/min (ref >60)
GFR calc non Af Amer: >60 mL/min (ref >60)
Glucose, Bld: 161 mg/dL — ABNORMAL HIGH (ref 70–99)
Potassium: 3.5 mmol/L (ref 3.5–5.1)
Sodium: 134 mmol/L — ABNORMAL LOW (ref 135–145)

## 2019-08-07 LAB — CBC
HCT: 32.7 % — ABNORMAL LOW (ref 39.0–52.0)
Hemoglobin: 10.9 g/dL — ABNORMAL LOW (ref 13.0–17.0)
MCH: 30 pg (ref 26.0–34.0)
MCHC: 33.3 g/dL (ref 30.0–36.0)
MCV: 90.1 fL (ref 80.0–100.0)
Platelets: 227 10*3/uL (ref 150–400)
RBC: 3.63 MIL/uL — ABNORMAL LOW (ref 4.22–5.81)
RDW: 14.8 % (ref 11.5–15.5)
WBC: 10.1 10*3/uL (ref 4.0–10.5)
nRBC: 0.4 % — ABNORMAL HIGH (ref 0.0–0.2)

## 2019-08-07 NOTE — Progress Notes (Signed)
Subjective: 2 Days Post-Op Procedure(s) (LRB): TOTAL KNEE REVISION   INCISION AND DRAINAGE WITH POLY EXCHANGE (Right) GASTROC FLAP WITH THICKNESS SKINGRAFT TO RIGHT KNEE (Right) Patient reports pain as 2 on 0-10 scale.    Objective: Vital signs in last 24 hours: Temp:  [97.8 F (36.6 C)-98.3 F (36.8 C)] 98.3 F (36.8 C) (02/07 0257) Pulse Rate:  [79-100] 79 (02/07 1357) Resp:  [16-20] 20 (02/07 1357) BP: (118-181)/(65-100) 146/76 (02/07 1357) SpO2:  [90 %-97 %] 90 % (02/07 1357)  Intake/Output from previous day: 02/06 0701 - 02/07 0700 In: 1850 [I.V.:852; Blood:698; IV Piggyback:300] Out: 1727 [Urine:1600; Drains:127] Intake/Output this shift: Total I/O In: 240 [P.O.:240] Out: 10 [Drains:10]  Recent Labs    08/04/19 1426 08/06/19 0309 08/07/19 0422  HGB 9.5* 8.4* 10.9*   Recent Labs    08/06/19 0309 08/07/19 0422  WBC 9.3 10.1  RBC 2.80* 3.63*  HCT 26.3* 32.7*  PLT 240 227   Recent Labs    08/06/19 0309 08/07/19 0422  NA 133* 134*  K 3.9 3.5  CL 97* 93*  CO2 28 29  BUN 19 23  CREATININE 0.90 0.85  GLUCOSE 158* 161*  CALCIUM 9.1 9.1   No results for input(s): LABPT, INR in the last 72 hours.  ABD soft Neurovascular intact knee immobilizer in place and vac improving    Assessment/Plan: 2 Days Post-Op Procedure(s) (LRB): TOTAL KNEE REVISION   INCISION AND DRAINAGE WITH POLY EXCHANGE (Right) GASTROC FLAP WITH THICKNESS SKINGRAFT TO RIGHT KNEE (Right) Advance diet    Devin [name] J Vennie Waymire 08/07/2019, 2:09 PM

## 2019-08-08 ENCOUNTER — Encounter: Payer: Self-pay | Admitting: *Deleted

## 2019-08-08 DIAGNOSIS — B9689 Other specified bacterial agents as the cause of diseases classified elsewhere: Secondary | ICD-10-CM

## 2019-08-08 LAB — CBC
HCT: 34.3 % — ABNORMAL LOW (ref 39.0–52.0)
Hemoglobin: 11.2 g/dL — ABNORMAL LOW (ref 13.0–17.0)
MCH: 29.7 pg (ref 26.0–34.0)
MCHC: 32.7 g/dL (ref 30.0–36.0)
MCV: 91 fL (ref 80.0–100.0)
Platelets: 214 10*3/uL (ref 150–400)
RBC: 3.77 MIL/uL — ABNORMAL LOW (ref 4.22–5.81)
RDW: 15 % (ref 11.5–15.5)
WBC: 9.7 10*3/uL (ref 4.0–10.5)
nRBC: 0 % (ref 0.0–0.2)

## 2019-08-08 LAB — BPAM RBC
Blood Product Expiration Date: 202102112359
Blood Product Expiration Date: 202102112359
ISSUE DATE / TIME: 202102061845
ISSUE DATE / TIME: 202102070037
Unit Type and Rh: 9500
Unit Type and Rh: 9500

## 2019-08-08 LAB — TYPE AND SCREEN
ABO/RH(D): O NEG
Antibody Screen: NEGATIVE
Unit division: 0
Unit division: 0

## 2019-08-08 NOTE — Plan of Care (Signed)
  Problem: Education: Goal: Knowledge of General Education information will improve Description: Including pain rating scale, medication(s)/side effects and non-pharmacologic comfort measures Outcome: Progressing   Problem: Clinical Measurements: Goal: Respiratory complications will improve Outcome: Progressing Note: On room air   Problem: Nutrition: Goal: Adequate nutrition will be maintained Outcome: Progressing Note: Continues to drink ensure around the clock, won't eat much from cafeteria    Problem: Coping: Goal: Level of anxiety will decrease Outcome: Progressing   Problem: Elimination: Goal: Will not experience complications related to urinary retention Outcome: Progressing   Problem: Pain Managment: Goal: General experience of comfort will improve Outcome: Progressing Note: Treated for right leg/knee pain with oxycodone multiple times today   Problem: Safety: Goal: Ability to remain free from injury will improve Outcome: Progressing

## 2019-08-08 NOTE — Progress Notes (Signed)
  [facility]  Date of Admission:  07/30/2019     Total days of antibiotics 9         ASSESSMENT:  Devin [name] is POD 3 s/p incision and drainage of right knee with arthrotomy and poly exchange with revision polyethylene. He has remained afebrile and hemodynamically stable. Cultures have been positive for Pseudomonas and Fingoldia. He will need 6 week of IV therapy with Cefepime and metronidazole and then transition to oral Ciprofloxacin for 6 months. OPAT orders placed. Will arrange for clinic follow up in 3-4 weeks.   PLAN:  1. Continue cefepime and metronidazole.  2. OPAT orders placed. 3. Wound care per orthopedics. 4. Follow up in ID clinic. 5. Ok for discharge from ID standpoint.  Diagnosis: Right knee prosthetic joint infection  Culture Result: Pseudomonas / Finegoldia   No Known Allergies  OPAT Orders Discharge antibiotics: Cefepime / Metronidazole Per pharmacy protocol   Duration: 6 weeks  End Date: 09/11/19  PIC Care Per Protocol:  Home health RN for IV administration and teaching; PICC line care and labs.    Labs weekly while on IV antibiotics: _X_ CBC with differential __ BMP _X_ CMP _X_ CRP _X_ ESR __ Vancomycin trough __ CK  _X_ Please pull PIC at completion of IV antibiotics __ Please leave PIC in place until doctor has seen patient or been notified  Fax weekly labs to [phone]  Clinic Follow Up Appt: 08/31/19 at 4:00pm with Dr. Van Dam   Active Problems:   Infection of total right knee replacement (HCC)   Acute pain of right knee   Pseudomonas infection   Right knee skin infection   S/P revision of total knee, right   . aspirin  81 mg Oral BID  . Chlorhexidine Gluconate Cloth  6 each Topical Daily  . docusate sodium  100 mg Oral BID  . feeding supplement (ENSURE ENLIVE)  237 mL Oral TID BM  . labetalol  300 mg Oral BID  . losartan  100 mg Oral Daily  . metroNIDAZOLE  500 mg Oral Q8H  .  multivitamin with minerals  1 tablet Oral Daily    SUBJECTIVE:  Afebrile overnight with no acute events. Resting comfortably during visit.   No Known Allergies   Review of Systems: Review of Systems  Constitutional: Negative for chills, fever and weight loss.  Respiratory: Negative for cough, shortness of breath and wheezing.   Cardiovascular: Negative for chest pain and leg swelling.  Gastrointestinal: Negative for abdominal pain, constipation, diarrhea, nausea and vomiting.  Skin: Negative for rash.      OBJECTIVE: Vitals:   08/07/19 2050 08/08/19 0257 08/08/19 0746 08/08/19 1257  BP: (!) 174/83 (!) 167/75 (!) 196/88 (!) 186/87  Pulse: 89 81 86 91  Resp:   20 16  Temp: 97.7 F (36.5 C) 98 F (36.7 C) 98.3 F (36.8 C) 98.5 F (36.9 C)  TempSrc: Oral Oral Oral Oral  SpO2: 93% 98% 95% 97%  Weight:      Height:       Body mass index is 27.4 kg/m.  Physical Exam Constitutional:      General: He is not in acute distress.    Appearance: He is well-developed.  Cardiovascular:     Rate and Rhythm: Normal rate and regular rhythm.     Heart sounds: Normal heart sounds.  Pulmonary:     Effort: Pulmonary effort is normal.     Breath sounds: Normal breath sounds.    Skin:    General: Skin is warm and dry.  Neurological:     Mental Status: He is alert and oriented to person, place, and time.  Psychiatric:        Behavior: Behavior normal.        Thought Content: Thought content normal.        Judgment: Judgment normal.     Lab Results Lab Results  Component Value Date   WBC 9.7 08/08/2019   HGB 11.2 (L) 08/08/2019   HCT 34.3 (L) 08/08/2019   MCV 91.0 08/08/2019   PLT 214 08/08/2019    Lab Results  Component Value Date   CREATININE 0.85 08/07/2019   BUN 23 08/07/2019   NA 134 (L) 08/07/2019   K 3.5 08/07/2019   CL 93 (L) 08/07/2019   CO2 29 08/07/2019    Lab Results  Component Value Date   ALT 28 05/25/2019   AST 26 05/25/2019   ALKPHOS 89 05/25/2019    BILITOT 1.0 05/25/2019     Microbiology: Recent Results (from the past 240 hour(s))  Wound or Superficial Culture     Status: None   Collection Time: 07/30/19 11:20 AM   Specimen: Wound  Result Value Ref Range Status   Specimen Description WOUND RIGHT KNEE  Final   Special Requests Normal  Final   Gram Stain   Final    RARE WBC PRESENT, PREDOMINANTLY PMN ABUNDANT GRAM NEGATIVE RODS FEW GRAM POSITIVE COCCI IN CLUSTERS Performed at [facility], [address] [place], [place] [address]    Culture ABUNDANT PSEUDOMONAS AERUGINOSA  Final   Report Status 08/01/2019 FINAL  Final   Organism ID, Bacteria PSEUDOMONAS AERUGINOSA  Final      Susceptibility   Pseudomonas aeruginosa - MIC*    CEFTAZIDIME 2 SENSITIVE Sensitive     CIPROFLOXACIN <=0.25 SENSITIVE Sensitive     GENTAMICIN <=1 SENSITIVE Sensitive     IMIPENEM 1 SENSITIVE Sensitive     PIP/TAZO <=4 SENSITIVE Sensitive     CEFEPIME 2 SENSITIVE Sensitive     * ABUNDANT PSEUDOMONAS AERUGINOSA  Body fluid culture     Status: None   Collection Time: 07/30/19  2:44 PM   Specimen: Synovium  Result Value Ref Range Status   Specimen Description SYNOVIAL RIGHT KNEE  Final   Special Requests NONE  Final   Gram Stain   Final    ABUNDANT WBC PRESENT, PREDOMINANTLY PMN NO ORGANISMS SEEN Performed at [facility], [place] [address], [place], [place] [address]    Culture NO GROWTH  Final   Report Status 08/02/2019 FINAL  Final  SARS CORONAVIRUS 2 (TAT 6-24 HRS) Nasopharyngeal Nasopharyngeal Swab     Status: None   Collection Time: 07/30/19  3:25 PM   Specimen: Nasopharyngeal Swab  Result Value Ref Range Status   SARS Coronavirus 2 NEGATIVE NEGATIVE Final    Comment: (NOTE) SARS-CoV-2 target nucleic acids are NOT DETECTED. The SARS-CoV-2 RNA is generally detectable in upper and lower respiratory specimens during the acute phase of infection. Negative results do not preclude SARS-CoV-2 infection, do not rule out  co-infections with other pathogens, and should not be used as the sole basis for treatment or other patient management decisions. Negative results must be combined with clinical observations, patient history, and epidemiological information. The expected result is Negative. Fact Sheet for Patients: SugarRoll.be Fact Sheet for Healthcare Providers: https://www.woods-mathews.com/ This test is not yet approved or cleared by the [place] FDA and  has  been authorized for detection and/or diagnosis of SARS-CoV-2 by FDA under an Emergency Use Authorization (EUA). This EUA will remain  in effect (meaning this test can be used) for the duration of the COVID-19 declaration under Section 56 4(b)(1) of the Act, 21 U.S.C. section 360bbb-3(b)(1), unless the authorization is terminated or revoked sooner. Performed at [facility], [address] [place], [place] [address]   MRSA PCR Screening     Status: None   Collection Time: 07/30/19 10:30 PM   Specimen: Nasal Mucosa; Nasopharyngeal  Result Value Ref Range Status   MRSA by PCR NEGATIVE NEGATIVE Final    Comment:        The GeneXpert MRSA Assay (FDA approved for NASAL specimens only), is one component of a comprehensive MRSA colonization surveillance program. It is not intended to diagnose MRSA infection nor to guide or monitor treatment for MRSA infections. Performed at [facility], [address] [place], [place] [address]   Aerobic Culture (superficial specimen)     Status: None   Collection Time: 07/31/19  8:15 AM   Specimen: Fluid  Result Value Ref Range Status   Specimen Description FLUID RIGHT KNEE  Final   Special Requests NONE  Final   Gram Stain NO WBC SEEN NO ORGANISMS SEEN   Final   Culture   Final    RARE PSEUDOMONAS AERUGINOSA CRITICAL RESULT CALLED TO, READ BACK BY AND VERIFIED WITH: RN GEORGE N. [id number] FCP Performed at [facility], [address] [place], [place] [address]    Report Status 08/02/2019 FINAL  Final   Organism ID, Bacteria PSEUDOMONAS AERUGINOSA  Final      Susceptibility   Pseudomonas aeruginosa - MIC*    CEFTAZIDIME 2 SENSITIVE Sensitive     CIPROFLOXACIN <=0.25 SENSITIVE Sensitive     GENTAMICIN <=1 SENSITIVE Sensitive     IMIPENEM 2 SENSITIVE Sensitive     PIP/TAZO <=4 SENSITIVE Sensitive     CEFEPIME 2 SENSITIVE Sensitive     * RARE PSEUDOMONAS AERUGINOSA  Aerobic/Anaerobic Culture (surgical/deep wound)     Status: None   Collection Time: 07/31/19  8:23 AM   Specimen: PATH Cytology Misc. fluid; Tissue  Result Value Ref Range Status   Specimen Description TISSUE RIGHT KNEE  Final   Special Requests NONE  Final   Gram Stain   Final    MODERATE WBC PRESENT, PREDOMINANTLY PMN MODERATE GRAM POSITIVE COCCI IN PAIRS IN CLUSTERS FEW GRAM NEGATIVE RODS Performed at [facility], [address] [place], [place] [address]    Culture   Final    MODERATE PSEUDOMONAS AERUGINOSA ABUNDANT FINEGOLDIA MAGNA    Report Status 08/04/2019 FINAL  Final   Organism ID, Bacteria PSEUDOMONAS AERUGINOSA  Final      Susceptibility   Pseudomonas aeruginosa - MIC*    CEFTAZIDIME 4 SENSITIVE Sensitive     CIPROFLOXACIN <=0.25 SENSITIVE Sensitive     GENTAMICIN <=1 SENSITIVE Sensitive     IMIPENEM 2 SENSITIVE Sensitive     PIP/TAZO <=4 SENSITIVE Sensitive     CEFEPIME 2 SENSITIVE Sensitive     * MODERATE PSEUDOMONAS AERUGINOSA  Aerobic/Anaerobic Culture (surgical/deep wound)     Status: None   Collection Time: 07/31/19  8:28 AM   Specimen: PATH Cytology Misc. fluid; Other  Result Value Ref Range Status   Specimen Description TISSUE RIGHT KNEE DEEP TISSUE  Final   Special Requests NONE  Final   Gram Stain   Final      FEW WBC PRESENT, PREDOMINANTLY PMN NO ORGANISMS SEEN Performed at [facility] [address], [place], [place] [address]    Culture   Final    FEW PSEUDOMONAS AERUGINOSA CRITICAL RESULT CALLED  TO, READ BACK BY AND VERIFIED WITH: RN Iona Beard NJIRI [id number] 08/02/19 FCP FEW FINEGOLDIA MAGNA    Report Status 08/04/2019 FINAL  Final   Organism ID, Bacteria PSEUDOMONAS AERUGINOSA  Final      Susceptibility   Pseudomonas aeruginosa - MIC*    CEFTAZIDIME 2 SENSITIVE Sensitive     CIPROFLOXACIN <=0.25 SENSITIVE Sensitive     GENTAMICIN <=1 SENSITIVE Sensitive     IMIPENEM 1 SENSITIVE Sensitive     PIP/TAZO <=4 SENSITIVE Sensitive     CEFEPIME 2 SENSITIVE Sensitive     * FEW PSEUDOMONAS AERUGINOSA     Terri Piedra, NP [facility]  08/08/2019  1:38 PM

## 2019-08-08 NOTE — Progress Notes (Signed)
Subjective: 3 Days Post-Op Procedure(s) (LRB): TOTAL KNEE REVISION   INCISION AND DRAINAGE WITH POLY EXCHANGE (Right) GASTROC FLAP WITH THICKNESS SKINGRAFT TO RIGHT KNEE (Right) Patient resting comfortably.  Objective: Vital signs in last 24 hours: Temp:  [97.7 F (36.5 C)-98.3 F (36.8 C)] 98.3 F (36.8 C) (02/08 0746) Pulse Rate:  [79-89] 86 (02/08 0746) Resp:  [20] 20 (02/08 0746) BP: (146-196)/(75-88) 196/88 (02/08 0746) SpO2:  [90 %-98 %] 95 % (02/08 0746)  Intake/Output from previous day: 02/07 0701 - 02/08 0700 In: 1969.8 [P.O.:720; I.V.:1049.8; IV Piggyback:200] Out: 2255 [Urine:2200; Drains:55] Intake/Output this shift: No intake/output data recorded.  Recent Labs    08/06/19 0309 08/07/19 0422 08/08/19 0355  HGB 8.4* 10.9* 11.2*   Recent Labs    08/07/19 0422 08/08/19 0355  WBC 10.1 9.7  RBC 3.63* 3.77*  HCT 32.7* 34.3*  PLT 227 214   Recent Labs    08/06/19 0309 08/07/19 0422  NA 133* 134*  K 3.9 3.5  CL 97* 93*  CO2 28 29  BUN 19 23  CREATININE 0.90 0.85  GLUCOSE 158* 161*  CALCIUM 9.1 9.1   No results for input(s): LABPT, INR in the last 72 hours.  Wound vac and JP drains in place and functioning, knee immobilizer in place  Assessment/Plan: 3 Days Post-Op Procedure(s) (LRB): TOTAL KNEE REVISION   INCISION AND DRAINAGE WITH POLY EXCHANGE (Right) GASTROC FLAP WITH THICKNESS SKINGRAFT TO RIGHT KNEE (Right)  ABLA improving  NWB RLE Ok for transfers Aspirin dvt proph Pain control as ordered Cont abx per ID Cont wound vac per plastics dispo plan on home with [facility] and PT possibly midweek   [employer] 08/08/2019, 8:33 AM

## 2019-08-08 NOTE — Progress Notes (Signed)
3 Days Post-Op  Subjective: Resting in bed, no complaints.    Objective: Vital signs in last 24 hours: Temp:  [97.7 F (36.5 C)-98.5 F (36.9 C)] 98.5 F (36.9 C) (02/08 1257) Pulse Rate:  [79-91] 91 (02/08 1257) Resp:  [16-20] 16 (02/08 1257) BP: (146-196)/(75-88) 186/87 (02/08 1257) SpO2:  [90 %-98 %] 97 % (02/08 1257) Last BM Date: (PTA)  Intake/Output from previous day: 02/07 0701 - 02/08 0700 In: 1969.8 [P.O.:720; I.V.:1049.8; IV Piggyback:200] Out: 2255 [Urine:2200; Drains:55] Intake/Output this shift: Total I/O In: 1947 [P.O.:237; I.V.:76.6; IV Piggyback:1633.4] Out: -   General appearance: alert, cooperative and no distress Extremities: RLE with wound vac in place over anterior knee and lower thigh. Honeycomb dressing over lateral mid thigh. JP drain in place, serosanguinous output. Wound vac with 200cc of serosanguinous fluid in canister, no leaks noted. Periwound area non erythematous. Pulses: 2+ and symmetric Incision/Wound: Wound vac in place  Lab Results:  CBC Latest Ref Rng & Units 08/08/2019 08/07/2019 08/06/2019  WBC 4.0 - 10.5 K/uL 9.7 10.1 9.3  Hemoglobin 13.0 - 17.0 g/dL 11.2(L) 10.9(L) 8.4(L)  Hematocrit 39.0 - 52.0 % 34.3(L) 32.7(L) 26.3(L)  Platelets 150 - 400 K/uL 214 227 240    BMET Recent Labs    08/06/19 0309 08/07/19 0422  NA 133* 134*  K 3.9 3.5  CL 97* 93*  CO2 28 29  GLUCOSE 158* 161*  BUN 19 23  CREATININE 0.90 0.85  CALCIUM 9.1 9.1   PT/INR No results for input(s): LABPROT, INR in the last 72 hours. ABG No results for input(s): PHART, HCO3 in the last 72 hours.  Invalid input(s): PCO2, PO2  Studies/Results: No results found.  Anti-infectives: Anti-infectives (From admission, onward)   Start     Dose/Rate Route Frequency Ordered Stop   08/05/19 2200  ceFEPIme (MAXIPIME) 2 g in sodium chloride 0.9 % 100 mL IVPB     2 g 200 mL/hr over 30 Minutes Intravenous Every 8 hours 08/05/19 1402     08/05/19 0824  vancomycin (VANCOCIN)  powder  Status:  Discontinued       As needed 08/05/19 0825 08/05/19 1138   08/05/19 0817  gentamicin (GARAMYCIN) injection  Status:  Discontinued       As needed 08/05/19 0818 08/05/19 1138   08/04/19 1400  metroNIDAZOLE (FLAGYL) tablet 500 mg     500 mg Oral Every 8 hours 08/04/19 1324     08/01/19 0800  vancomycin (VANCOCIN) IVPB 1000 mg/200 mL premix  Status:  Discontinued     1,000 mg 200 mL/hr over 60 Minutes Intravenous Every 24 hours 07/31/19 1131 08/01/19 1051   07/31/19 1345  ceFEPIme (MAXIPIME) 2 g in sodium chloride 0.9 % 100 mL IVPB  Status:  Discontinued     2 g 200 mL/hr over 30 Minutes Intravenous Every 12 hours 07/31/19 1336 08/05/19 1402   07/31/19 1100  cefTRIAXone (ROCEPHIN) 2 g in sodium chloride 0.9 % 100 mL IVPB  Status:  Discontinued     2 g 200 mL/hr over 30 Minutes Intravenous Every 24 hours 07/31/19 1054 07/31/19 1329   07/31/19 0832  vancomycin (VANCOCIN) powder  Status:  Discontinued       As needed 07/31/19 0833 07/31/19 0915   07/31/19 0831  gentamicin (GARAMYCIN) injection  Status:  Discontinued       As needed 07/31/19 0832 07/31/19 0915   07/31/19 0815  vancomycin (VANCOREADY) IVPB 1500 mg/300 mL     1,500 mg 150 mL/hr over 120 Minutes  Intravenous To Surgery 07/31/19 0807 07/31/19 1021      Assessment/Plan: s/p Procedure(s): TOTAL KNEE REVISION   INCISION AND DRAINAGE WITH POLY EXCHANGE GASTROC FLAP WITH THICKNESS SKINGRAFT TO RIGHT KNEE  Plan for removal and replacement of wound vac tomorrow afternoon to check status of skin graft.  Continue with ensure protein shakes - will benefit rate of healing.   LOS: 9 days    Charlies Constable, PA-C 08/08/2019

## 2019-08-09 ENCOUNTER — Telehealth: Payer: Self-pay | Admitting: *Deleted

## 2019-08-09 LAB — BASIC METABOLIC PANEL
Anion gap: 10 (ref 5–15)
BUN: 25 mg/dL — ABNORMAL HIGH (ref 8–23)
CO2: 30 mmol/L (ref 22–32)
Calcium: 8.9 mg/dL (ref 8.9–10.3)
Chloride: 93 mmol/L — ABNORMAL LOW (ref 98–111)
Creatinine, Ser: 0.79 mg/dL (ref 0.61–1.24)
GFR calc Af Amer: >60 mL/min (ref >60)
GFR calc non Af Amer: >60 mL/min (ref >60)
Glucose, Bld: 131 mg/dL — ABNORMAL HIGH (ref 70–99)
Potassium: 4 mmol/L (ref 3.5–5.1)
Sodium: 133 mmol/L — ABNORMAL LOW (ref 135–145)

## 2019-08-09 LAB — CBC
HCT: 34.4 % — ABNORMAL LOW (ref 39.0–52.0)
Hemoglobin: 11.2 g/dL — ABNORMAL LOW (ref 13.0–17.0)
MCH: 29.6 pg (ref 26.0–34.0)
MCHC: 32.6 g/dL (ref 30.0–36.0)
MCV: 91 fL (ref 80.0–100.0)
Platelets: 227 10*3/uL (ref 150–400)
RBC: 3.78 MIL/uL — ABNORMAL LOW (ref 4.22–5.81)
RDW: 14.7 % (ref 11.5–15.5)
WBC: 8.6 10*3/uL (ref 4.0–10.5)
nRBC: 0 % (ref 0.0–0.2)

## 2019-08-09 NOTE — Telephone Encounter (Signed)
Wound vac form filled out and faxed to [facility].  Confirmation received.//AB/CMA

## 2019-08-09 NOTE — Progress Notes (Signed)
4 Days Post-Op  Subjective: Resting in bed upon eval No complaints.  Objective: Vital signs in last 24 hours: Temp:  [98.2 F (36.8 C)-98.6 F (37 C)] 98.6 F (37 C) (02/09 0750) Pulse Rate:  [71-91] 81 (02/09 1206) Resp:  [16-19] 18 (02/09 0750) BP: (132-186)/(56-87) 132/80 (02/09 1206) SpO2:  [94 %-97 %] 96 % (02/09 0750) Last BM Date: (PTA)  Intake/Output from previous day: 02/08 0701 - 02/09 0700 In: 2274.8 [P.O.:237; I.V.:204.4; IV Piggyback:1833.4] Out: 1835 [Urine:1750; Drains:85] Intake/Output this shift: Total I/O In: -  Out: 420 [Drains:420]  General appearance: alert, cooperative and no distress  Extremities: LLE without abnormality, SCDs in place.  RLE with wound vac in place, honeycomb dressing in place - moderately soiled, staples in place along medial gastroc incision. Wound bed/skin graft in place, superior portion over patella with darker tissue, lower portion with good color, gastroc noted with beefy red color. No foul odor. No purulence noted. periwound skin without any sign of necrosis. Primary closure superior incision healing well, no dehiscence noted.    Lab Results:  CBC Latest Ref Rng & Units 08/09/2019 08/08/2019 08/07/2019  WBC 4.0 - 10.5 K/uL 8.6 9.7 10.1  Hemoglobin 13.0 - 17.0 g/dL 11.2(L) 11.2(L) 10.9(L)  Hematocrit 39.0 - 52.0 % 34.4(L) 34.3(L) 32.7(L)  Platelets 150 - 400 K/uL 227 214 227    BMET Recent Labs    08/07/19 0422 08/09/19 0506  NA 134* 133*  K 3.5 4.0  CL 93* 93*  CO2 29 30  GLUCOSE 161* 131*  BUN 23 25*  CREATININE 0.85 0.79  CALCIUM 9.1 8.9   PT/INR No results for input(s): LABPROT, INR in the last 72 hours. ABG No results for input(s): PHART, HCO3 in the last 72 hours.  Invalid input(s): PCO2, PO2  Studies/Results: No results found.  Anti-infectives: Anti-infectives (From admission, onward)   Start     Dose/Rate Route Frequency Ordered Stop   08/05/19 2200  ceFEPIme (MAXIPIME) 2 g in sodium chloride 0.9 % 100  mL IVPB     2 g 200 mL/hr over 30 Minutes Intravenous Every 8 hours 08/05/19 1402     08/05/19 0824  vancomycin (VANCOCIN) powder  Status:  Discontinued       As needed 08/05/19 0825 08/05/19 1138   08/05/19 0817  gentamicin (GARAMYCIN) injection  Status:  Discontinued       As needed 08/05/19 0818 08/05/19 1138   08/04/19 1400  metroNIDAZOLE (FLAGYL) tablet 500 mg     500 mg Oral Every 8 hours 08/04/19 1324     08/01/19 0800  vancomycin (VANCOCIN) IVPB 1000 mg/200 mL premix  Status:  Discontinued     1,000 mg 200 mL/hr over 60 Minutes Intravenous Every 24 hours 07/31/19 1131 08/01/19 1051   07/31/19 1345  ceFEPIme (MAXIPIME) 2 g in sodium chloride 0.9 % 100 mL IVPB  Status:  Discontinued     2 g 200 mL/hr over 30 Minutes Intravenous Every 12 hours 07/31/19 1336 08/05/19 1402   07/31/19 1100  cefTRIAXone (ROCEPHIN) 2 g in sodium chloride 0.9 % 100 mL IVPB  Status:  Discontinued     2 g 200 mL/hr over 30 Minutes Intravenous Every 24 hours 07/31/19 1054 07/31/19 1329   07/31/19 0832  vancomycin (VANCOCIN) powder  Status:  Discontinued       As needed 07/31/19 0833 07/31/19 0915   07/31/19 0831  gentamicin (GARAMYCIN) injection  Status:  Discontinued       As needed 07/31/19 2952 07/31/19  0915   07/31/19 0815  vancomycin (VANCOREADY) IVPB 1500 mg/300 mL     1,500 mg 150 mL/hr over 120 Minutes Intravenous To Surgery 07/31/19 0807 07/31/19 1021      Assessment/Plan: s/p Procedure(s): TOTAL KNEE REVISION  INCISION AND DRAINAGE WITH POLY EXCHANGE GASTROC FLAP WITH THICKNESS SKINGRAFT TO RIGHT KNEE  -Patient stable for discharge from plastic surgery stance.   -Wound vac changed today by Dr. Arita Miss and myself. Patient will need home vac, consult to case management for assistance. Patient to follow up in plastic surgery clinic in ~ 1 week for removal of wound vac.   Wound care plan: 1. Wound vac to remain in place for 1 week over Knee wound. Dressing changed today.  2. Prior to discharge,  nursing to change dressing on skin graft donor site - apply new xeroform, 4x4 gauze, ABD pad, medipore tape.  3. RLE medial gastroc wound with staples in place, covered with kerlix and ACE when wrapping RLE.   -Continue with healthy eating, ensure protein shakes for optimal nutrition.     LOS: 10 days    Leslee Home, PA-C 08/09/2019

## 2019-08-09 NOTE — Plan of Care (Signed)

## 2019-08-09 NOTE — Progress Notes (Signed)
Pharmacy Antibiotic Note  Devin [name] is a 84 y.o. male admitted on 07/30/2019 with knee infection.  Pharmacy has been consulted for Cefepime dosing.  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 Anticipate 6 weeks of IV abx with PO suppression x 5 months. CrCl 70 - repeat I&D on 2/5  1/31 Vancomycin >>2/1 1/31 Cefepime >> (3/19) 2/4 Flagyl>>  1/30 wound right knee >> ABUNDANT PSEUDOMONAS AERUGINOSA 1/30 synovial right knee body fluid >> pseudomonas 1/31 surgical deep wound tissue culture > pseudomonas   Plan: Cefepime 2g IV q8h while inpatient (CrCl is currently 70) OPAT placed with q 12hrs dosing per ID preference   Height: 5\' 6"  (167.6 cm) Weight: 169 lb 12.1 oz (77 kg)(from 07/28/19 encounter) IBW/kg (Calculated) : 63.8  Temp (24hrs), Avg:98.5 F (36.9 C), Min:98.2 F (36.8 C), Max:98.6 F (37 C)  Recent Labs  Lab 08/02/19 1040 08/03/19 0311 08/04/19 1426 08/05/19 0403 08/06/19 0309 08/07/19 0422 08/08/19 0355 08/09/19 0506  WBC  --    < > 5.8  --  9.3 10.1 9.7 8.6  CREATININE 1.14  --   --  0.92 0.90 0.85  --  0.79   < > = values in this interval not displayed.    Estimated Creatinine Clearance: 70.8 mL/min (by C-G formula based on SCr of 0.79 mg/dL).    No Known Allergies  [name] S. 10/07/19, PharmD, BCPS Clinical Staff Pharmacist [email or web address]  Merilynn Finland 08/09/2019 9:09 AM

## 2019-08-09 NOTE — Progress Notes (Signed)
Nutrition Follow-up  RD working remotely.  DOCUMENTATION CODES:   Not applicable  INTERVENTION:   -Continue Ensure Enlive po TID, each supplement provides 350 kcal and 20 grams of protein -Continue MVI with minerals daily -Pt with no documented BM this admission; consider initiation of bowl regmen  NUTRITION DIAGNOSIS:   Increased nutrient needs related to post-op healing as evidenced by estimated needs.  Ongoing  GOAL:   Patient will meet greater than or equal to 90% of their needs  Progressing   MONITOR:   PO intake, Supplement acceptance, Labs, Weight trends, Skin, I & O's  REASON FOR ASSESSMENT:   Consult Assessment of nutrition requirement/status  ASSESSMENT:   Patient is s/p RTK 06/03/2019 with Dr. French Ana, he developed some skin issues acutely following surgery within the first week which in tern led to some wound necrosis and dehisence since.  He was initially treated with some po doxycycline but not currently on abx.  Since surgery postop period also complicated by hospital admission closer to his home in [place] for positive covid and pneumonia.  Family member states he has also been seen for dehydration.  They were consulted by plastic surgery 2 days ago who mentioned possiblity of skin graft pending no deep infection.  Up to this point wound has not appeared infected despite the dehisence which has seemed to progress over the last month.  We did do a knee aspiration on 06/10/19 which showed total cell count of 12,600, negative culture, no crystals.  Denies fevers, chills, sweats, they do note some wound drainage which has been persistent but not increasing.  Family member called ortho on call this AM mentioning increased right knee pain, inability to bear weight, not eating, instructed to come to [facility] for admission under Dr. French Ana and likely plan for surgery.  1/31- s/p I&D of rt knee, revision of polyethylene exchange, tissue transfer, antibiotic bead placement, and  application of wound vac 2/5- s/p Right knee soft tissue reconstruction with pedicled medial gastrocnemius flap; Complex closure right knee skin and soft tissue measuring 5 cm; Debridement of right knee skin and subcutaneous tissue totaling 5 x 3 cm; Split-thickness skin graft to right knee totaling 13 x 7 cm; Wound VAC application right knee totaling 25 x 7 cm  Reviewed I/O's: +439 ml x 24 hours and -431 ml since admission  UOP: 1.8 L x 24 hours  Drain output: 85 ml x 24 hours  Attempted to speak with pt via phone, however, no answer.  Per plastics note, plan for removal and replacement of wound vac today to check status of skin graft.   Pt's intake has improved since last visit. Noted meal completion 25-100%. Pt has been consuming Ensure supplements and is enjoying them.   Medications reviewed and include colace (initiated on 07/30/19). Per doc floswheets, pt has not had a BM since admission.   Plan to d/c home with home health and wound vac once medically stable.   Labs reviewed: Na: 133.    Diet Order:   Diet Order            Diet regular Room service appropriate? Yes; Fluid consistency: Thin  Diet effective now              EDUCATION NEEDS:   Education needs have been addressed  Skin:  Skin Assessment: Skin Integrity Issues: Skin Integrity Issues:: Wound VAC Wound Vac: rt knee  Last BM:  Unknown  Height:   Ht Readings from Last 1 Encounters:  08/01/19  5\' 6"  (1.676 m)    Weight:   Wt Readings from Last 1 Encounters:  08/01/19 77 kg    Ideal Body Weight:  64.5 kg  BMI:  Body mass index is 27.4 kg/m.  Estimated Nutritional Needs:   Kcal:  2050-2250  Protein:  100-115 grams  Fluid:  > 2 L    01-30-1979, RD, LDN, CDCES Registered Dietitian II Certified Diabetes Care and Education Specialist Please refer to [facility] for RD and/or RD on-call/weekend/after hours pager

## 2019-08-09 NOTE — Care Plan (Signed)
Spoke with patient's daughter in law, primary caregiver, this am. Discussed current situation and plan for home with IV antibiotics via PICC line. Will need the care outlined from plastics for wound care. [facility] is ready to accept referral when patient is ready for discharge.   Shauna Hugh, RNCM  [phone]

## 2019-08-09 NOTE — Progress Notes (Signed)
Subjective: 4 Days Post-Op Procedure(s) (LRB): TOTAL KNEE REVISION   INCISION AND DRAINAGE WITH POLY EXCHANGE (Right) GASTROC FLAP WITH THICKNESS SKINGRAFT TO RIGHT KNEE (Right)   Objective: Vital signs in last 24 hours: Temp:  [98.2 F (36.8 C)-98.6 F (37 C)] 98.6 F (37 C) (02/09 0750) Pulse Rate:  [71-88] 81 (02/09 1206) Resp:  [17-19] 18 (02/09 0750) BP: (132-146)/(56-82) 132/80 (02/09 1206) SpO2:  [94 %-96 %] 96 % (02/09 0750)  Intake/Output from previous day: 02/08 0701 - 02/09 0700 In: 2274.8 [P.O.:237; I.V.:204.4; IV Piggyback:1833.4] Out: 1835 [Urine:1750; Drains:85] Intake/Output this shift: Total I/O In: -  Out: 420 [Drains:420]  Recent Labs    08/07/19 0422 08/08/19 0355 08/09/19 0506  HGB 10.9* 11.2* 11.2*   Recent Labs    08/08/19 0355 08/09/19 0506  WBC 9.7 8.6  RBC 3.77* 3.78*  HCT 34.3* 34.4*  PLT 214 227   Recent Labs    08/07/19 0422 08/09/19 0506  NA 134* 133*  K 3.5 4.0  CL 93* 93*  CO2 29 30  BUN 23 25*  CREATININE 0.85 0.79  GLUCOSE 161* 131*  CALCIUM 9.1 8.9   No results for input(s): LABPT, INR in the last 72 hours.   Assessment/Plan: 4 Days Post-Op Procedure(s) (LRB): TOTAL KNEE REVISION   INCISION AND DRAINAGE WITH POLY EXCHANGE (Right) GASTROC FLAP WITH THICKNESS SKINGRAFT TO RIGHT KNEE (Right) NWB RLE in knee immobilizer, no ROM R knee Pain control as ordered Aspirin dvt proph Cont ensure nutrition Wound vac changed per plastics Cont abx per ID PT/OT consults with weight bearing restrictions to assess for discharge home with home health Will allow bathroom privileges but otherwise minimal activity dispo- likely tomorrow or Thursday pending assessments and home need coordination    Margart Sickles 08/09/2019, 1:29 PM

## 2019-08-09 NOTE — Progress Notes (Signed)
Authorization for home wound vac sent to [facility] for approval this afternoon. Spoke with [facility] team and they will have info routed to case management team. Appreciate assistance.

## 2019-08-09 NOTE — Evaluation (Signed)
Physical Therapy Re-Evaluation Patient Details Name: Devin [name] MRN: [id number] DOB: 06/05/1936 Today's Date: 08/09/2019   History of Present Illness  Pt is an 84 y/o admitted with Rt TKA necrosis with pyarthrosis s/p I&D with TKA revision. 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 S/p I&D, poly exchange, washout and gastroc muscle flap w/ STSG on 08/05/19.  Clinical Impression  Patient presents s/p surgery as noted above and still able to mobilize with min A and cues to keep NWB R LE.  Spoke by phone with daughter in law who will be assisting him at home to ensure follows precautions.  Still appropriate for home with follow up HHPT and wheelchair for trips out of the home.  PT to follow until d/c.     Follow Up Recommendations Home health PT;Supervision/Assistance - 24 hour    Equipment Recommendations  Wheelchair (measurements PT);Wheelchair cushion (measurements PT)(18x18 w/ [employer])    Recommendations for Other Services       Precautions / Restrictions Precautions Precautions: Fall;Knee Precaution Comments: VAC Required Braces or Orthoses: Knee Immobilizer - Right(Bledsoe locked in extension in room with better fit) Knee Immobilizer - Right: On at all times Restrictions Weight Bearing Restrictions: Yes RLE Weight Bearing: Non weight bearing      Mobility  Bed Mobility Overal bed mobility: Needs Assistance Bed Mobility: Supine to Sit     Supine to sit: Min assist     General bed mobility comments: increased time and needed assist to lift trunk  Transfers Overall transfer level: Needs assistance Equipment used: Rolling walker (2 wheeled) Transfers: Sit to/from Stand Sit to Stand: Min assist         General transfer comment: cues for safety, pt pulls up on RW so stabilized walker and cues throughout for NWB R LE  Ambulation/Gait Ambulation/Gait assistance: Min assist Gait Distance (Feet): 4 Feet Assistive device: Rolling walker (2 wheeled)(& Bledsoe locked  in extension) Gait Pattern/deviations: Step-to pattern     General Gait Details: placing R heel on the ground so continued to cue for NWB and pt able to perform for small scoots backwards to chair with RW  Stairs            Wheelchair Mobility    Modified Rankin (Stroke Patients Only)       Balance Overall balance assessment: Needs assistance   Sitting balance-Leahy Scale: Fair     Standing balance support: Bilateral upper extremity supported Standing balance-Leahy Scale: Poor Standing balance comment: with NWB R LE                             Pertinent Vitals/Pain Pain Assessment: Faces Faces Pain Scale: Hurts little more Pain Location: right knee Pain Descriptors / Indicators: Aching;Sore;Guarding Pain Intervention(s): Monitored during session;Repositioned    Home Living Family/patient expects to be discharged to:: Private residence Living Arrangements: Spouse/significant other Available Help at Discharge: Family;Available 24 hours/day Type of Home: House Home Access: Stairs to enter   [employer] of Steps: 1 Home Layout: Two level;Able to live on main level with bedroom/bathroom Home Equipment: Bedside commode;Walker - 2 wheels Additional Comments: stated he has w/c at home, but daughter in law reports he doesn't have one    Prior Function Level of Independence: Independent with assistive device(s)         Comments: pt states he uses RW or cane, children assist him     Hand Dominance  Extremity/Trunk Assessment   Upper Extremity Assessment Upper Extremity Assessment: Overall WFL for tasks assessed    Lower Extremity Assessment Lower Extremity Assessment: RLE deficits/detail RLE Deficits / Details: pt with KI on at all times so ROM unable to be assessed; able to lift leg with brace on and wiggles ankle RLE: Unable to fully assess due to immobilization       Communication   Communication: HOH  Cognition  Arousal/Alertness: Awake/alert Behavior During Therapy: Flat affect Overall Cognitive Status: No family/caregiver present to determine baseline cognitive functioning Area of Impairment: Safety/judgement                   Current Attention Level: Selective Memory: Decreased short-term memory;Decreased recall of precautions Following Commands: Follows one step commands consistently;Follows one step commands with increased time Safety/Judgement: Decreased awareness of safety;Decreased awareness of deficits   Problem Solving: Requires verbal cues General Comments: perseverating on going home, educated would be tomorrow at least after MD sees him      General Comments General comments (skin integrity, edema, etc.): Educated pt for bed to chair (w/c or 3:1) only    Exercises     Assessment/Plan    PT Assessment Patient needs continued PT services  PT Problem List Decreased strength;Decreased mobility;Decreased activity tolerance;Decreased balance;Decreased safety awareness;Decreased knowledge of precautions;Decreased cognition;Decreased knowledge of use of DME;Pain       PT Treatment Interventions DME instruction;Gait training;Stair training;Functional mobility training;Therapeutic activities;Patient/family education;Cognitive remediation;Therapeutic exercise    PT Goals (Current goals can be found in the Care Plan section)  Acute Rehab PT Goals Patient Stated Goal: go home PT Goal Formulation: With patient Time For Goal Achievement: 08/16/19 Potential to Achieve Goals: Good    Frequency Min 5X/week   Barriers to discharge        Co-evaluation               AM-PAC PT "6 Clicks" Mobility  Outcome Measure Help needed turning from your back to your side while in a flat bed without using bedrails?: A Little Help needed moving from lying on your back to sitting on the side of a flat bed without using bedrails?: A Little Help needed moving to and from a bed to a  chair (including a wheelchair)?: A Little Help needed standing up from a chair using your arms (e.g., wheelchair or bedside chair)?: A Little Help needed to walk in hospital room?: A Little Help needed climbing 3-5 steps with a railing? : A Lot 6 Click Score: 17    End of Session Equipment Utilized During Treatment: Gait belt;Right knee immobilizer Activity Tolerance: Patient tolerated treatment well Patient left: in chair;with call bell/phone within reach;with chair alarm set Nurse Communication: Mobility status;Precautions;Weight bearing status PT Visit Diagnosis: Other abnormalities of gait and mobility (R26.89);Muscle weakness (generalized) (M62.81)    Time: 9518-8416 PT Time Calculation (min) (ACUTE ONLY): 20 min   Charges:   PT Evaluation $PT Re-evaluation: 1 Re-eval          [place] [phone] 08/09/2019   Reginia Naas 08/09/2019, 4:00 PM

## 2019-08-09 NOTE — Progress Notes (Signed)
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  A walker alone will not resolve the issues with performing activities of daily living. A wheelchair will allow patient to safely perform daily activities.  The patient can self propel in the home or has a caregiver who can provide assistance.     Sheran Lawless, [place] [facility] [phone] 08/09/2019

## 2019-08-10 MED ORDER — METRONIDAZOLE 500 MG PO TABS: 500.0000 mg | ORAL_TABLET | Freq: Three times a day (TID) | ORAL | 0 refills | Status: AC

## 2019-08-10 MED ORDER — ASPIRIN EC 81 MG PO TBEC: 81.0000 mg | DELAYED_RELEASE_TABLET | Freq: Two times a day (BID) | ORAL | 0 refills | Status: AC

## 2019-08-10 MED ORDER — DOCUSATE SODIUM 100 MG PO CAPS: 100.0000 mg | ORAL_CAPSULE | Freq: Two times a day (BID) | ORAL | 0 refills | Status: AC

## 2019-08-10 MED ORDER — ENSURE ENLIVE PO LIQD: 237.0000 mL | Freq: Three times a day (TID) | ORAL | 0 refills | Status: AC

## 2019-08-10 MED ORDER — CEFEPIME IV (FOR PTA / DISCHARGE USE ONLY): 2.0000 g | Freq: Two times a day (BID) | INTRAVENOUS | 0 refills | Status: AC

## 2019-08-10 MED ORDER — OXYCODONE HCL 5 MG PO TABS: 5.0000 mg | ORAL_TABLET | ORAL | 0 refills | Status: AC | PRN

## 2019-08-10 NOTE — Progress Notes (Signed)
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 S/p I&D, poly exchange, washout and gastroc muscle flap w/ STSG on 08/05/19.   Clinical Impression   Pt seen for OT re-eval s/p surgery on 08/05/2019. Pt now with NWB RLE status. Pt currently min to mod A with LB ADLs and functional transfers, setup with UB ADLs. Pt with difficulty achieving NWB RLE during transitional movements during sit<>stand but able to hold RLE in NWB position once in standing. Pt declined further mobilizing in the room or transfer to recliner, requesting back to bed. Pt reports daughter will be assisting him at home. D/c plan remains appropriate.     Follow Up Recommendations  Home health OT; Supervision/Assistance - 24 hour    Equipment Recommendations       Recommendations for Other Services       Precautions / Restrictions Precautions Precautions: Fall;Knee Precaution Comments: VAC Required Braces or Orthoses: Knee Immobilizer - Right( Bledsoe locked in extension in room with better fit) Knee Immobilizer - Right: On at all times Restrictions Weight Bearing Restrictions: Yes RLE Weight Bearing: Non weight bearing      Mobility Bed Mobility Overal bed mobility: Needs Assistance Bed Mobility: Supine to Sit;Sit to Supine     Supine to sit: Min assist Sit to supine: Min guard   General bed mobility comments: increased time and needed assist to lift trunk  Transfers Overall transfer level: Needs assistance Equipment used: Rolling walker (2 wheeled) Transfers: Sit to/from Stand Sit to Stand: Min assist         General transfer comment: cues for safety and technique with rw including hand placement. Difficulty adhering to NWB RLE during transition but once in  standing could hold RLE in NWB position. Steadying assist    Balance Overall balance assessment: Needs assistance Sitting-balance support: Feet supported Sitting balance-Leahy Scale: Fair Sitting balance - Comments: sat EOB to complete UB/LB bathing   Standing balance support: Bilateral upper extremity supported Standing balance-Leahy Scale: Poor Standing balance comment: with NWB R LE                           ADL either performed or assessed with clinical judgement   ADL Overall ADL's : Needs assistance/impaired Eating/Feeding: Set up;Sitting   Grooming: Set up;Sitting   Upper Body Bathing: Set up;Sitting   Lower Body Bathing: Moderate assistance Lower Body Bathing Details (indicate cue type and reason): pt stood with BUE of rw while therapsit finished LB bathing in standing position.  Upper Body Dressing : Set up;Sitting   Lower Body Dressing: Moderate assistance Lower Body Dressing Details (indicate cue type and reason): simulated Toilet Transfer: Min guard;Ambulation;RW;Cueing for safety;Stand-pivot;Minimal assistance   Toileting- Clothing Manipulation and Hygiene: Minimal assistance;Min guard;Sit to/from stand         General ADL Comments: Pt completed bed mobility, UB/LB bathing with 1x sit<>stand incoroporated. Pt declined further mobilizing or transfer to recliner. Cues for safety and adherance to NWB which he struggled with during sit<>stand transition. Pt repeating that he is eager to d/c home today.     Vision Baseline Vision/History: Wears glasses       Perception     Praxis      Pertinent Vitals/Pain Pain Assessment:  Faces Faces Pain Scale: Hurts little more Pain Location: right knee Pain Descriptors / Indicators: Aching;Sore;Guarding Pain Intervention(s): Monitored during session     Hand Dominance Left   Extremity/Trunk Assessment Upper Extremity Assessment Upper Extremity Assessment: Overall WFL for tasks assessed;Generalized  weakness   Lower Extremity Assessment Lower Extremity Assessment: Defer to PT evaluation   Cervical / Trunk Assessment Cervical / Trunk Assessment: Kyphotic   Communication Communication Communication: HOH   Cognition Arousal/Alertness: Awake/alert Behavior During Therapy: Flat affect;Impulsive;Restless Overall Cognitive Status: No family/caregiver present to determine baseline cognitive functioning Area of Impairment: Safety/judgement                   Current Attention Level: Selective Memory: Decreased short-term memory;Decreased recall of precautions Following Commands: Follows one step commands consistently Safety/Judgement: Decreased awareness of safety;Decreased awareness of deficits   Problem Solving: Requires verbal cues     General Comments       Exercises     Shoulder Instructions      Home Living Family/patient expects to be discharged to:: Private residence Living Arrangements: Spouse/significant other Available Help at Discharge: Family;Available 24 hours/day Type of Home: House Home Access: Stairs to enter [employer] of Steps: 1 Entrance Stairs-Rails: None Home Layout: Two level;Able to live on main level with bedroom/bathroom Alternate Level Stairs-Number of Steps: pt has a chair lift for stairs to basement (he got it for his dogs to ride up/down the stairs because they typically stay downstairs and have trouble getting up the stairs)   Bathroom Shower/Tub: Producer, television/film/video: Standard Bathroom Accessibility: Yes   Home Equipment: Bedside commode;Walker - 2 wheels          Prior Functioning/Environment Level of Independence: Independent with assistive device(s)        Comments: pt states he uses RW or cane, children assist him        OT Problem List: Impaired balance (sitting and/or standing);Decreased cognition;Pain;Decreased safety awareness;Decreased activity tolerance;Decreased knowledge of use of DME  or AE      OT Treatment/Interventions: Self-care/ADL training;DME and/or AE instruction;Therapeutic activities;Patient/family education    OT Goals(Current goals can be found in the care plan section) Acute Rehab OT Goals Patient Stated Goal: go home OT Goal Formulation: With patient Time For Goal Achievement: 08/15/19 Potential to Achieve Goals: Good  OT Frequency: Min 2X/week   Barriers to D/C:            Co-evaluation              AM-PAC OT "6 Clicks" Daily Activity     Outcome Measure Help from another person eating meals?: None Help from another person taking care of personal grooming?: None Help from another person toileting, which includes using toliet, bedpan, or urinal?: A Little Help from another person bathing (including washing, rinsing, drying)?: A Lot Help from another person to put on and taking off regular upper body clothing?: None Help from another person to put on and taking off regular lower body clothing?: A Lot 6 Click Score: 19   End of Session Equipment Utilized During Treatment: Rolling walker;Right knee immobilizer( Bledsoe locked in extension )  Activity Tolerance: Patient tolerated treatment well;Patient limited by fatigue Patient left: in bed;with call bell/phone within reach;with bed alarm set;Other (comment)  OT Visit Diagnosis: Other abnormalities of gait and mobility (R26.89);Pain Pain - Right/Left: Right Pain - part of body: Knee;Leg                Time:  6599-3570 OT Time Calculation (min): 24 min Charges:  OT General Charges $OT Visit: 1 Visit OT Evaluation $OT Re-eval: 1 Re-eval  Raynald Kemp, OT Acute Rehabilitation Services Pager: [phone] Office: [phone]   Pilar Grammes 08/10/2019, 11:40 AM

## 2019-08-10 NOTE — Progress Notes (Signed)
Physical Therapy Treatment Patient Details Name: Devin [name] MRN: [id number] DOB: 07/20/1935 Today's Date: 08/10/2019    History of Present Illness Pt is an 84 y/o admitted with Rt TKA necrosis with pyarthrosis s/p I&D with TKA revision. 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 S/p I&D, poly exchange, washout and gastroc muscle flap w/ STSG on 08/05/19.    PT Comments    Patient seen for mobility progression. Pt requires min A for functional transfer training. Gait deferred as pt has difficulty maintaining NWB status. R LE bandage saturated and blood on bed pad and RN notified. Continue to progress as tolerated. Current plan remains appropriate.     Follow Up Recommendations  Home health PT;Supervision/Assistance - 24 hour     Equipment Recommendations  Wheelchair (measurements PT);Wheelchair cushion (measurements PT)(18x18 w/ [employer])    Recommendations for Other Services       Precautions / Restrictions Precautions Precautions: Fall;Knee Precaution Comments: VAC Required Braces or Orthoses: Knee Immobilizer - Right( Bledsoe locked in extension ) Knee Immobilizer - Right: On at all times Restrictions Weight Bearing Restrictions: Yes RLE Weight Bearing: Non weight bearing    Mobility  Bed Mobility Overal bed mobility: Needs Assistance Bed Mobility: Supine to Sit;Sit to Supine     Supine to sit: Min guard Sit to supine: Min guard   General bed mobility comments: min guard for safety; use of rail to elevate trunk from flat bed  Transfers Overall transfer level: Needs assistance Equipment used: Rolling walker (2 wheeled) Transfers: Sit to/from [facility] Sit to Stand: Min assist Stand pivot transfers: Min assist       General transfer comment: cues for safety, sequencing/technique, and maintaining NWB status throughout session  Ambulation/Gait                 Stairs             Wheelchair Mobility    Modified Rankin  (Stroke Patients Only)       Balance Overall balance assessment: Needs assistance Sitting-balance support: Feet supported Sitting balance-Leahy Scale: Fair     Standing balance support: Bilateral upper extremity supported Standing balance-Leahy Scale: Poor                              Cognition Arousal/Alertness: Awake/alert Behavior During Therapy: Impulsive;Restless Overall Cognitive Status: No family/caregiver present to determine baseline cognitive functioning Area of Impairment: Safety/judgement                         Safety/Judgement: Decreased awareness of safety;Decreased awareness of deficits   Problem Solving: Requires verbal cues        Exercises      General Comments General comments (skin integrity, edema, etc.): pt with saturated bandage and bleeding onto floor while sitting on BSC; RN notified       Pertinent Vitals/Pain Pain Assessment: Faces Faces Pain Scale: Hurts a little bit Pain Location: right knee Pain Descriptors / Indicators: Guarding;Discomfort Pain Intervention(s): Monitored during session;Repositioned    Home Living                      Prior Function            PT Goals (current goals can now be found in the care plan section) Acute Rehab PT Goals Patient Stated Goal: go home Progress towards PT goals: Progressing toward goals  Frequency    Min 5X/week      PT Plan Current plan remains appropriate    Co-evaluation              AM-PAC PT "6 Clicks" Mobility   Outcome Measure  Help needed turning from your back to your side while in a flat bed without using bedrails?: A Little Help needed moving from lying on your back to sitting on the side of a flat bed without using bedrails?: A Little Help needed moving to and from a bed to a chair (including a wheelchair)?: A Little Help needed standing up from a chair using your arms (e.g., wheelchair or bedside chair)?: A Little Help needed  to walk in hospital room?: A Little Help needed climbing 3-5 steps with a railing? : Total 6 Click Score: 16    End of Session Equipment Utilized During Treatment: Gait belt;Right knee immobilizer Activity Tolerance: Patient tolerated treatment well Patient left: with call bell/phone within reach;in bed;with bed alarm set Nurse Communication: Mobility status;Precautions;Weight bearing status PT Visit Diagnosis: Other abnormalities of gait and mobility (R26.89);Muscle weakness (generalized) (M62.81)     Time: 1520-1600 PT Time Calculation (min) (ACUTE ONLY): 40 min  Charges:  $Gait Training: 23-37 mins                     Erline Levine, PTA Acute Rehabilitation Services Pager: [phone] Office: [phone]     Carolynne Edouard 08/10/2019, 5:13 PM

## 2019-08-10 NOTE — Plan of Care (Signed)

## 2019-08-10 NOTE — Progress Notes (Signed)
Pt BP elevated. Pt states he is not in pain. Attempted to contact Cardiology through [place], IM, and [facility]), no luck...will passed information on to day shift.

## 2019-08-10 NOTE — Discharge Summary (Signed)
PATIENT ID: Devin [name]        MRN:  [id number]          DOB/AGE: 11-19-1935 / 85 y.o.    DISCHARGE SUMMARY  ADMISSION DATE:    07/30/2019 DISCHARGE DATE:   08/11/2019  ADMISSION DIAGNOSIS: Infection of total right knee replacement (HCC) [T84.53XA] S/P revision of total knee, right [Z96.651] Acute pain of right knee [M25.561]    DISCHARGE DIAGNOSIS:  RIGHT TOTAL KNEE INFECTION    ADDITIONAL DIAGNOSIS: Active Problems:   Infection of total right knee replacement (HCC)   Acute pain of right knee   Pseudomonas infection   Right knee skin infection   S/P revision of total knee, right  Past Medical History:  Diagnosis Date  . HOH (hard of hearing)   . Hypertension   . Hypertension     PROCEDURE: Procedure(s): TOTAL KNEE REVISION   INCISION AND DRAINAGE WITH POLY EXCHANGE GASTROC FLAP WITH THICKNESS SKINGRAFT TO RIGHT KNEE Right on 08/05/2019  CONSULTS: PT/OT/Nutrition/IV team/SW  Treatment Team:  Earlie Server, MD   HISTORY:  See H&P in chart  HOSPITAL COURSE:  Devin [name] is a 84 y.o. admitted on 07/30/2019 and found to have a diagnosis of RIGHT TOTAL KNEE INFECTION.  After appropriate laboratory studies were obtained  they were taken to the operating room on1/31/2021 for I&D with polyexchange. Taken back to OR on 08/05/2019 and underwent  Procedure(s): TOTAL KNEE REVISION   INCISION AND DRAINAGE WITH POLY EXCHANGE GASTROC FLAP WITH THICKNESS SKINGRAFT TO RIGHT KNEE  Right.   They were given perioperative antibiotics:  Anti-infectives (From admission, onward)   Start     Dose/Rate Route Frequency Ordered Stop   08/10/19 0000  ceFEPime (MAXIPIME) IVPB     2 g Intravenous Every 12 hours 08/10/19 1544 09/21/19 2359   08/10/19 0000  metroNIDAZOLE (FLAGYL) 500 MG tablet     500 mg Oral Every 8 hours 08/10/19 1640 09/16/19 2359   08/05/19 2200  ceFEPIme (MAXIPIME) 2 g in sodium chloride 0.9 % 100 mL IVPB     2 g 200 mL/hr over 30 Minutes Intravenous Every 8 hours  08/05/19 1402     08/05/19 0824  vancomycin (VANCOCIN) powder  Status:  Discontinued       As needed 08/05/19 0825 08/05/19 1138   08/05/19 0817  gentamicin (GARAMYCIN) injection  Status:  Discontinued       As needed 08/05/19 0818 08/05/19 1138   08/04/19 1400  metroNIDAZOLE (FLAGYL) tablet 500 mg     500 mg Oral Every 8 hours 08/04/19 1324     08/01/19 0800  vancomycin (VANCOCIN) IVPB 1000 mg/200 mL premix  Status:  Discontinued     1,000 mg 200 mL/hr over 60 Minutes Intravenous Every 24 hours 07/31/19 1131 08/01/19 1051   07/31/19 1345  ceFEPIme (MAXIPIME) 2 g in sodium chloride 0.9 % 100 mL IVPB  Status:  Discontinued     2 g 200 mL/hr over 30 Minutes Intravenous Every 12 hours 07/31/19 1336 08/05/19 1402   07/31/19 1100  cefTRIAXone (ROCEPHIN) 2 g in sodium chloride 0.9 % 100 mL IVPB  Status:  Discontinued     2 g 200 mL/hr over 30 Minutes Intravenous Every 24 hours 07/31/19 1054 07/31/19 1329   07/31/19 0832  vancomycin (VANCOCIN) powder  Status:  Discontinued       As needed 07/31/19 0833 07/31/19 0915   07/31/19 0831  gentamicin (GARAMYCIN) injection  Status:  Discontinued  As needed 07/31/19 0832 07/31/19 0915   07/31/19 0815  vancomycin (VANCOREADY) IVPB 1500 mg/300 mL     1,500 mg 150 mL/hr over 120 Minutes Intravenous To Surgery 07/31/19 0807 07/31/19 1021    .  Tolerated the procedure well.   Started on IV antibiotics 07/30/2019, consulted ID team Wound vac placed following both procedures and ran continuously. The remainder of the hospital course was dedicated to administration of IV abx, pain control, PT/OT referrals for safety and discharge assessment.  The patient was discharged on postadmission day 13 in  Stable condition.  Blood products given:2 units CC PRBC  DIAGNOSTIC STUDIES: Recent vital signs:  Patient Vitals for the past 24 hrs:  BP Temp Temp src Pulse Resp SpO2  08/10/19 1259 -- -- -- 75 -- 93 %  08/10/19 1258 (!) 141/64 98.5 F (36.9 C) Oral --  15 --  08/10/19 0618 (!) 176/82 -- -- 88 -- 96 %  08/10/19 0500 (!) 182/85 97.9 F (36.6 C) Oral 78 18 95 %  08/09/19 2324 -- -- -- 78 -- 94 %  08/09/19 2048 (!) 160/70 98.4 F (36.9 C) Oral 87 20 (!) 89 %       Recent laboratory studies: Recent Labs    08/04/19 1426 08/06/19 0309 08/07/19 0422 08/08/19 0355 08/09/19 0506  WBC 5.8 9.3 10.1 9.7 8.6  HGB 9.5* 8.4* 10.9* 11.2* 11.2*  HCT 29.5* 26.3* 32.7* 34.3* 34.4*  PLT 267 240 227 214 227   Recent Labs    08/05/19 0403 08/06/19 0309 08/07/19 0422 08/09/19 0506  NA 136 133* 134* 133*  K 3.5 3.9 3.5 4.0  CL 98 97* 93* 93*  CO2 _0 BUN _1 25*  CREATININE 0.92 0.90 0.85 0.79  GLUCOSE 138* 158* 161* 131*  CALCIUM 9.5 9.1 9.1 8.9   Lab Results  Component Value Date   INR 1.0 05/25/2019     Recent Radiographic Studies :  DG Knee 1-2 Views Right  Result Date: 07/30/2019 CLINICAL DATA:  Knee replacement a few months ago. Incision has never healed properly. Pain. EXAM: RIGHT KNEE - 1-2 VIEW COMPARISON:  None. FINDINGS: The patient is status post left knee replacement. Femoral and tibial components are in good position with no evidence of loosening or failure. Two screws extend through the distal femur. No joint effusion. A skin defect is seen anteriorly on the lateral view, likely the un healed incision. No definite bony erosion. IMPRESSION: 1. An apparent skin defect is seen anteriorly on the lateral view, consistent with the unhealed incision. No definitive erosion of the underlying patella. Surgical hardware is in good position. Electronically Signed   By: Dorise Bullion III M.D   On: 07/30/2019 14:53   CT Head Wo Contrast  Result Date: 07/30/2019 CLINICAL DATA:  Fall today with head and neck pain.  Ataxia. EXAM: CT HEAD WITHOUT CONTRAST CT CERVICAL SPINE WITHOUT CONTRAST TECHNIQUE: Multidetector CT imaging of the head and cervical spine was performed following the standard protocol without intravenous  contrast. Multiplanar CT image reconstructions of the cervical spine were also generated. COMPARISON:  Head CT 06/24/2019 FINDINGS: CT HEAD FINDINGS Brain: Ventricles and cisterns are within normal. There is mild stable prominence of the CSF spaces compatible with atrophic change. Moderate chronic ischemic microvascular disease is present. Small old lacunar infarct over the left lentiform nucleus. No evidence of mass, mass effect, shift of midline structures or acute hemorrhage. No evidence of acute infarction. Vascular: No hyperdense vessel  or unexpected calcification. Skull: Normal. Negative for fracture or focal lesion. Sinuses/Orbits: Orbits are normal. Hypoplastic frontal sinuses. Opacification over the sphenoid sinus slightly worse. Mastoid air cells are clear. Other: None. CT CERVICAL SPINE FINDINGS Alignment: Very subtle anterior subluxation of C5 and C6 likely degenerative in nature. No traumatic subluxation. Skull base and vertebrae: There is moderate spondylosis throughout the cervical spine. Vertebral body heights are normal. There is smooth sclerotic bordered lucency through the base of the dens separating the dens from the remainder of the C2 vertebral body compatible with an old fracture. There is uncovertebral joint spurring and moderate facet arthropathy. There is significant neural foraminal narrowing at multiple levels worse along the right side at the C3-4 and C4-5 levels. Soft tissues and spinal canal: Prevertebral soft tissues are normal. Spinal canal is unremarkable. Disc levels:  No significant disc space narrowing. Upper chest: No acute findings. Other: None. IMPRESSION: 1.  No acute brain injury. 2. Chronic ischemic microvascular disease and age related atrophic change. 3. No acute cervical spine injury. 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 4. Moderate spondylosis throughout the cervical spine with moderate bilateral neural foraminal narrowing at multiple levels as described. 5. Chronic  inflammatory change of the sphenoid sinus slightly worse. Electronically Signed   By: Marin Olp M.D.   On: 07/30/2019 13:40   CT Cervical Spine Wo Contrast  Result Date: 07/30/2019 CLINICAL DATA:  Fall today with head and neck pain.  Ataxia. EXAM: CT HEAD WITHOUT CONTRAST CT CERVICAL SPINE WITHOUT CONTRAST TECHNIQUE: Multidetector CT imaging of the head and cervical spine was performed following the standard protocol without intravenous contrast. Multiplanar CT image reconstructions of the cervical spine were also generated. COMPARISON:  Head CT 06/24/2019 FINDINGS: CT HEAD FINDINGS Brain: Ventricles and cisterns are within normal. There is mild stable prominence of the CSF spaces compatible with atrophic change. Moderate chronic ischemic microvascular disease is present. Small old lacunar infarct over the left lentiform nucleus. No evidence of mass, mass effect, shift of midline structures or acute hemorrhage. No evidence of acute infarction. Vascular: No hyperdense vessel or unexpected calcification. Skull: Normal. Negative for fracture or focal lesion. Sinuses/Orbits: Orbits are normal. Hypoplastic frontal sinuses. Opacification over the sphenoid sinus slightly worse. Mastoid air cells are clear. Other: None. CT CERVICAL SPINE FINDINGS Alignment: Very subtle anterior subluxation of C5 and C6 likely degenerative in nature. No traumatic subluxation. Skull base and vertebrae: There is moderate spondylosis throughout the cervical spine. Vertebral body heights are normal. There is smooth sclerotic bordered lucency through the base of the dens separating the dens from the remainder of the C2 vertebral body compatible with an old fracture. There is uncovertebral joint spurring and moderate facet arthropathy. There is significant neural foraminal narrowing at multiple levels worse along the right side at the C3-4 and C4-5 levels. Soft tissues and spinal canal: Prevertebral soft tissues are normal. Spinal canal is  unremarkable. Disc levels:  No significant disc space narrowing. Upper chest: No acute findings. Other: None. IMPRESSION: 1.  No acute brain injury. 2. Chronic ischemic microvascular disease and age related atrophic change. 3. No acute cervical spine injury. 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 4. Moderate spondylosis throughout the cervical spine with moderate bilateral neural foraminal narrowing at multiple levels as described. 5. 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 Electronically Signed   By: Marin Olp M.D.   On: 07/30/2019 13:40   Chest Portable 1 View  Result Date: 07/30/2019 CLINICAL DATA:  Right knee infection.  EXAM: PORTABLE CHEST 1 VIEW COMPARISON:  June 28, 2019 FINDINGS: Mild to moderate severity chronic appearing increased interstitial lung markings are seen. This is stable in appearance when compared to the prior study. Very mild, stable linear scarring and/or atelectasis is seen within the left lung base. There is no evidence of a pleural effusion or pneumothorax. The cardiac silhouette is markedly enlarged and unchanged in size. Chronic fourth and fifth right rib fractures are seen. Degenerative changes seen throughout the thoracic spine. IMPRESSION: 1. Chronic appearing increased interstitial lung markings with mild, stable left basilar linear scarring and/or atelectasis. Electronically Signed   By: Virgina Norfolk M.D.   On: 07/30/2019 18:41   [place] EKG SITE RITE  Result Date: 08/02/2019 If [facility] image not attached, placement could not be confirmed due to current cardiac rhythm.   DISCHARGE INSTRUCTIONS: Discharge Instructions    Ambulatory referral to [place]   Complete by: As directed    Please evaluate SIRCHARLES HOLZHEIMER for admission to [facility].  Disciplines requested: Nursing and Physical Therapy  Services to provide: IV Antibiotics  Physician to follow patient's care (the person listed here will be responsible for signing  ongoing orders): Other: Dr. Andres Ege Orthopaedic Surgery primary provider, IV abx managed by Dr. Rhina Brackett [facility] Infectious Disease [phone], Wound vac managed by    Requested Start of Care Date: Within 2-3 days  I certify that this patient is under my care and that I, or a Nurse Practitioner or Physician's Assistant working with me, had a face-to-face encounter that meets the physician face-to-face requirements with patient on 08/10/2019. The encounter with the patient was in whole, or in part for the following medical condition(s) which is the primary reason for home health care (List medical condition). Infected right total knee   Special Instructions:  Wound vac to be left in place, plastics to remove or change at follow up.  May touch down weight bear RLE just minimal activity more just to bathroom, etc.  Home Health being arranged thru [facility] in [place], [place] by Ladell Heads [facility] [phone]   Does the patient have Medicare or Medicaid?: Yes   The encounter with the patient was in whole, or in part, for the following medical condition, which is the primary reason for home health care: s/p R knee I&D polyexchange gastroc flap with skin graft for infected right total knee   Reason for Medically Necessary Home Health Services:  Skilled Nursing- Assessment and Training for Infusion Therapy, Line Care, and Infection Control Therapy- Therapeutic Exercises to Increase Strength and Endurance Therapy- Home Adaptation to Facilitate Safety     My clinical findings support the need for the above services: Pain interferes with ambulation/mobility   I certify that, based on my findings, the following services are medically necessary home health services:  Physical therapy Nursing     Further, I certify that my clinical findings support that this patient is homebound due to: Unable to leave home safely without assistance   Home infusion instructions   Complete  by: As directed    Instructions: Flushing of vascular access device: 0.9% NaCl pre/post medication administration and prn patency; Heparin 100 u/ml, 63m for implanted ports and Heparin 10u/ml, 525mfor all other central venous catheters.   PR LIGHTWEIGHT WHEELCHAIR   Complete by: As directed    PR WHEELCHAIR LIGHTWEIGHT LEG R   Complete by: As directed    Needs lightweight wheelchair with leg elevators  DISCHARGE MEDICATIONS:   Allergies as of 08/10/2019   No Known Allergies     Medication List    STOP taking these medications   chlorthalidone 25 MG tablet Commonly known as: HYGROTON     TAKE these medications   acetaminophen 325 MG tablet Commonly known as: Tylenol Take 2 tablets (650 mg total) by mouth every 4 (four) hours as needed.   aspirin EC 81 MG tablet Take 1 tablet (81 mg total) by mouth 2 (two) times daily. TO PREVENT BLOOD CLOTS   ceFEPime  IVPB Commonly known as: MAXIPIME Inject 2 g into the vein every 12 (twelve) hours. Indication:  pseudomonas prosthetic joint injection  Last Day of Therapy:  09/16/2019 Labs - twice weekly:  CBC/D and BMP, Labs - Every other week:  ESR and CRP   CVS D3 25 MCG (1000 UT) capsule Generic drug: Cholecalciferol Take 1,000 Units by mouth daily.   docusate sodium 100 MG capsule Commonly known as: COLACE Take 1 capsule (100 mg total) by mouth 2 (two) times daily.   feeding supplement (ENSURE ENLIVE) Liqd Take 237 mLs by mouth 3 (three) times daily between meals.   labetalol 300 MG tablet Commonly known as: NORMODYNE Take 300 mg by mouth 2 (two) times daily.   losartan 100 MG tablet Commonly known as: COZAAR Take 100 mg by mouth daily.   metroNIDAZOLE 500 MG tablet Commonly known as: FLAGYL Take 1 tablet (500 mg total) by mouth every 8 (eight) hours.   oxyCODONE 5 MG immediate release tablet Commonly known as: Oxy IR/ROXICODONE Take 1-2 tablets (5-10 mg total) by mouth every 4 (four) hours as needed for  breakthrough pain ((for MODERATE breakthrough pain)).            Home Infusion Instuctions  (From admission, onward)         Start     Ordered   08/10/19 0000  Home infusion instructions    Question:  Instructions  Answer:  Flushing of vascular access device: 0.9% NaCl pre/post medication administration and prn patency; Heparin 100 u/ml, 78m for implanted ports and Heparin 10u/ml, 550mfor all other central venous catheters.   08/10/19 1544          FOLLOW UP VISIT:   Follow-up Information    VaTommy MedalCoLavell IslamMD Follow up.   Specialty: Infectious Diseases Why: 08/31/19 at 4pm Contact information: [phone]      PaCindra PresumeMD. Schedule an appointment as soon as possible for a visit.   Specialty: Plastic Surgery Why: call to make appt to be seen on Monday 08/15/19  Contact information: [phone]        CaEarlie ServerMD. Call.   Specialty: Orthopedic Surgery Why: to be seen on Thursday 08/18/2019 Contact information: [phone]           DISPOSITION:   Home  CONDITION:  Stable   JoChriss CzarPA-C  08/10/2019 5:05 PM

## 2019-08-10 NOTE — Progress Notes (Signed)
Subjective: 5 Days Post-Op Procedure(s) (LRB): TOTAL KNEE REVISION   INCISION AND DRAINAGE WITH POLY EXCHANGE (Right) GASTROC FLAP WITH THICKNESS SKINGRAFT TO RIGHT KNEE (Right) Patient reports pain as mild.    Objective: Vital signs in last 24 hours: Temp:  [97.9 F (36.6 C)-98.5 F (36.9 C)] 98.5 F (36.9 C) (02/10 1258) Pulse Rate:  [75-88] 75 (02/10 1259) Resp:  [15-20] 15 (02/10 1258) BP: (141-182)/(64-85) 141/64 (02/10 1258) SpO2:  [89 %-96 %] 93 % (02/10 1259)  Intake/Output from previous day: 02/09 0701 - 02/10 0700 In: -  Out: 2220 [Urine:1100; Drains:1120] Intake/Output this shift: Total I/O In: 120 [P.O.:120] Out: -   Recent Labs    08/08/19 0355 08/09/19 0506  HGB 11.2* 11.2*   Recent Labs    08/08/19 0355 08/09/19 0506  WBC 9.7 8.6  RBC 3.77* 3.78*  HCT 34.3* 34.4*  PLT 214 227   Recent Labs    08/09/19 0506  NA 133*  K 4.0  CL 93*  CO2 30  BUN 25*  CREATININE 0.79  GLUCOSE 131*  CALCIUM 8.9   No results for input(s): LABPT, INR in the last 72 hours.  Neurovascular intact Sensation intact distally Intact pulses distally wound vac in place and functioning   Assessment/Plan: 5 Days Post-Op Procedure(s) (LRB): TOTAL KNEE REVISION   INCISION AND DRAINAGE WITH POLY EXCHANGE (Right) GASTROC FLAP WITH THICKNESS SKINGRAFT TO RIGHT KNEE (Right) Up with therapy Continue ABX therapy due to Post-op infection Plan for discharge tomorrow Discharge home with home health  Pt is to receive AM dose of IV abx and IV infusion nurse to do teaching with caregiver prior to d/c home tomorrow  Margart Sickles 08/10/2019, 5:13 PM

## 2019-08-10 NOTE — Plan of Care (Signed)

## 2019-08-10 NOTE — Discharge Instructions (Signed)
Diet: As you were doing prior to hospitalization plus the ensure nutrition as recommended  Activity: minimal activity or walking   No lifting or driving  Shower: sponge bath  Dressing: wound vac to remain in place and functioning.  Plastic surgery to change at follow up appt.  Weight Bearing: touch down weight bearing as taught in physical therapy. Use a walker or  Crutches as instructed. Also will get wheelchair to use as needed.  To prevent constipation: you may use a stool softener such as -  Colace ( over the counter) 100 mg by mouth twice a day  Drink plenty of fluids ( prune juice may be helpful) and high fiber foods  Miralax ( over the counter) for constipation as needed.   Precautions: If you experience chest pain or shortness of breath - call 911 immediately For transfer to the hospital emergency department!!  If you develop a fever greater that 101 F, purulent drainage from wound, increased redness or drainage from wound, or calf pain -- Call the office to schedule to be seen by Dr. Madelon Lips on Thursday 08/18/2019  Follow- Up Appointment: Ginette Otto - [phone]

## 2019-08-11 NOTE — Progress Notes (Signed)
Physical Therapy Treatment Patient Details Name: Devin [name] MRN: [id number] DOB: 11-22-1935 Today's Date: 08/11/2019    History of Present Illness Pt is an 84 y/o admitted with Rt TKA necrosis with pyarthrosis s/p I&D with TKA revision. 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 S/p I&D, poly exchange, washout and gastroc muscle flap w/ STSG on 08/05/19.    PT Comments    Pt in bed on arrival and agreeable to OOB activity. He progressed OOB to recliner chair, though distance continues to be limited as pt does not adhear to NWB precautions. When questioned, he states he is keeping weight off his foot, though his heel remains on the ground during gait and transfers. Gave pt instructions for bumping up steps in a WC and called daughter in law to reiterate the info, as pt is [name] and did not demonstrate that he had learned the info provided. Took time to answer daughter in laws questions about mobility and he is expected to d/c today with HHPT to follow up.     Follow Up Recommendations  Home health PT;Supervision/Assistance - 24 hour     Equipment Recommendations  Wheelchair (measurements PT);Wheelchair cushion (measurements PT)(18x18 w/ [employer])    Recommendations for Other Services OT consult     Precautions / Restrictions Precautions Precautions: Fall;Knee Precaution Comments: VAC Required Braces or Orthoses: Knee Immobilizer - Right( Bledsoe locked in extension ) Knee Immobilizer - Right: On at all times Restrictions Weight Bearing Restrictions: Yes RLE Weight Bearing: Non weight bearing    Mobility  Bed Mobility Overal bed mobility: Needs Assistance Bed Mobility: Supine to Sit     Supine to sit: Min guard     General bed mobility comments: min guard with HOB elevated and use of bed rails.  Transfers Overall transfer level: Needs assistance Equipment used: Rolling walker (2 wheeled) Transfers: Sit to/from [facility] Sit to Stand: Min  assist Stand pivot transfers: Min assist       General transfer comment: Cues for hand placement and WB precautions. Pt continues to place weight throught R heel, though he states that he isn't.  Ambulation/Gait Ambulation/Gait assistance: Min assist Gait Distance (Feet): 4 Feet Assistive device: Rolling walker (2 wheeled)(& Bledsoe locked in extension) Gait Pattern/deviations: Step-to pattern Gait velocity: decreased   General Gait Details: small steps to recliner chair. pt placing R heel on ground and required continued cues for NWB.    Stairs             Wheelchair Mobility    Modified Rankin (Stroke Patients Only)       Balance Overall balance assessment: Needs assistance Sitting-balance support: Feet supported Sitting balance-Leahy Scale: Fair     Standing balance support: Bilateral upper extremity supported Standing balance-Leahy Scale: Poor Standing balance comment: required UE support for balance due to NWB on R LE                            Cognition Arousal/Alertness: Awake/alert Behavior During Therapy: WFL for tasks assessed/performed Overall Cognitive Status: No family/caregiver present to determine baseline cognitive functioning                                 General Comments: Pt HOH making teaching dificult. He was able to recall WB precautions but did not adhearto during transfer.      Exercises  General Comments General comments (skin integrity, edema, etc.): Pts bandages and bed linins saturated. RN present and stripped bed once pt was OOB.      Pertinent Vitals/Pain Pain Assessment: Faces Faces Pain Scale: Hurts a little bit Pain Location: right knee Pain Descriptors / Indicators: Guarding;Discomfort Pain Intervention(s): Monitored during session;Limited activity within patient's tolerance;Repositioned    Home Living                      Prior Function            PT Goals (current goals  can now be found in the care plan section) Acute Rehab PT Goals Patient Stated Goal: go home PT Goal Formulation: With patient Time For Goal Achievement: 08/16/19 Potential to Achieve Goals: Good Progress towards PT goals: Progressing toward goals    Frequency    Min 5X/week      PT Plan Current plan remains appropriate    Co-evaluation              AM-PAC PT "6 Clicks" Mobility   Outcome Measure  Help needed turning from your back to your side while in a flat bed without using bedrails?: A Little Help needed moving from lying on your back to sitting on the side of a flat bed without using bedrails?: A Little Help needed moving to and from a bed to a chair (including a wheelchair)?: A Little Help needed standing up from a chair using your arms (e.g., wheelchair or bedside chair)?: A Little Help needed to walk in hospital room?: A Little Help needed climbing 3-5 steps with a railing? : Total 6 Click Score: 16    End of Session Equipment Utilized During Treatment: Gait belt;Right knee immobilizer Activity Tolerance: Patient tolerated treatment well Patient left: with call bell/phone within reach;in chair;with chair alarm set Nurse Communication: Mobility status;Precautions;Weight bearing status PT Visit Diagnosis: Other abnormalities of gait and mobility (R26.89);Muscle weakness (generalized) (M62.81)     Time: 2119-4174 PT Time Calculation (min) (ACUTE ONLY): 18 min  Charges:  $Therapeutic Activity: 8-22 mins                     Devin [name], [place] Pager [phone] Acute Rehab   Sheral Apley 08/11/2019, 10:35 AM

## 2019-08-11 NOTE — TOC Transition Note (Signed)
Transition of Care [facility]) - CM/SW Discharge Note   Patient Details  Name: Devin [name] MRN: [id number] Date of Birth: 27-Mar-1936  Transition of Care [facility]) CM/SW Contact:  Truddie Hidden, LCSW Phone Number: 08/11/2019, 4:28 PM   Clinical Narrative:     Discharged home with home health. DME ordered and plan is to have it delivered to the home by [facility]. IV abx will be delivered to home today by 6 pm.   Pam provided teaching to [facility]. Family will transport. No other needs at this time. Case closed to this CSW.   Final next level of care: Home w Home Health Services Barriers to Discharge: Barriers Resolved   Patient Goals and CMS Choice   CMS Medicare.gov Compare Post Acute Care list provided to:: Patient Represenative (must comment)(Luann) Choice offered to / list presented to : [facility] POA / Guardian  Discharge Placement                       Discharge Plan and Services                DME Arranged: Wheelchair manual DME Agency: Other - Comment([facility]) Date DME Agency Contacted: 08/11/19 Time DME Agency Contacted: [id number] Representative spoke with at DME Agency: Morrie Sheldon HH Arranged: PT, OT [facility] Agency: [facility], [employer] Date [facility] Agency Contacted: 08/11/19 Time HH Agency Contacted: [phone] Representative spoke with at [facility] Agency: Barry Dienes  Social Determinants of Health (SDOH) Interventions     Readmission Risk Interventions No flowsheet data found.

## 2019-08-11 NOTE — Progress Notes (Signed)
Devin [name] is present at time of discharge, stated she only has  HCPOA and when patient came in for his knee replacement originally and the hospital staff would not let anybody go back with patient he didn't understand that HCPOA and legal guardian were separate so he said yes she was.

## 2019-08-12 DIAGNOSIS — B965 Pseudomonas (aeruginosa) (mallei) (pseudomallei) as the cause of diseases classified elsewhere: Secondary | ICD-10-CM | POA: Diagnosis not present

## 2019-08-12 DIAGNOSIS — G4733 Obstructive sleep apnea (adult) (pediatric): Secondary | ICD-10-CM | POA: Diagnosis not present

## 2019-08-12 DIAGNOSIS — H9190 Unspecified hearing loss, unspecified ear: Secondary | ICD-10-CM | POA: Diagnosis not present

## 2019-08-12 DIAGNOSIS — I10 Essential (primary) hypertension: Secondary | ICD-10-CM | POA: Diagnosis not present

## 2019-08-12 DIAGNOSIS — Z945 Skin transplant status: Secondary | ICD-10-CM | POA: Diagnosis not present

## 2019-08-12 DIAGNOSIS — G8918 Other acute postprocedural pain: Secondary | ICD-10-CM | POA: Diagnosis not present

## 2019-08-12 DIAGNOSIS — Z8616 Personal history of COVID-19: Secondary | ICD-10-CM | POA: Diagnosis not present

## 2019-08-12 DIAGNOSIS — Z792 Long term (current) use of antibiotics: Secondary | ICD-10-CM | POA: Diagnosis not present

## 2019-08-12 DIAGNOSIS — T8131XA Disruption of external operation (surgical) wound, not elsewhere classified, initial encounter: Secondary | ICD-10-CM | POA: Diagnosis not present

## 2019-08-12 DIAGNOSIS — T8453XA Infection and inflammatory reaction due to internal right knee prosthesis, initial encounter: Secondary | ICD-10-CM | POA: Diagnosis not present

## 2019-08-12 DIAGNOSIS — E785 Hyperlipidemia, unspecified: Secondary | ICD-10-CM | POA: Diagnosis not present

## 2019-08-12 DIAGNOSIS — Z452 Encounter for adjustment and management of vascular access device: Secondary | ICD-10-CM | POA: Diagnosis not present

## 2019-08-12 DIAGNOSIS — Z9181 History of falling: Secondary | ICD-10-CM | POA: Diagnosis not present

## 2019-08-15 ENCOUNTER — Other Ambulatory Visit: Payer: Self-pay

## 2019-08-15 ENCOUNTER — Ambulatory Visit (INDEPENDENT_AMBULATORY_CARE_PROVIDER_SITE_OTHER): Payer: Medicare Other | Admitting: Plastic Surgery

## 2019-08-15 DIAGNOSIS — G4733 Obstructive sleep apnea (adult) (pediatric): Secondary | ICD-10-CM | POA: Diagnosis not present

## 2019-08-15 DIAGNOSIS — T8453XA Infection and inflammatory reaction due to internal right knee prosthesis, initial encounter: Secondary | ICD-10-CM | POA: Diagnosis not present

## 2019-08-15 DIAGNOSIS — G8918 Other acute postprocedural pain: Secondary | ICD-10-CM | POA: Diagnosis not present

## 2019-08-15 DIAGNOSIS — S81001A Unspecified open wound, right knee, initial encounter: Secondary | ICD-10-CM

## 2019-08-15 DIAGNOSIS — I10 Essential (primary) hypertension: Secondary | ICD-10-CM | POA: Diagnosis not present

## 2019-08-15 DIAGNOSIS — E785 Hyperlipidemia, unspecified: Secondary | ICD-10-CM | POA: Diagnosis not present

## 2019-08-15 DIAGNOSIS — H9190 Unspecified hearing loss, unspecified ear: Secondary | ICD-10-CM | POA: Diagnosis not present

## 2019-08-15 NOTE — Progress Notes (Signed)
Patient presents 1.5 weeks postop from knee debridement, gastroc flap, and stsg.  Here for exam and vac change.  Patient feels well other than some mild knee pain.  JP putting out 40cc/day.  Minimal output in wound vac.  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  Gastroc muscle is healthy up to the border of the patella with adherence of skin graft.  Remaining skin and incisions look fine.  Replaced VAC today.  Will discuss further management of patella with Dr. Madelon Lips.  Patient asked to remain non-weight bearing with knee in immobilizer.  I will see him Thursday for another VAC change.

## 2019-08-16 DIAGNOSIS — T8453XA Infection and inflammatory reaction due to internal right knee prosthesis, initial encounter: Secondary | ICD-10-CM | POA: Diagnosis not present

## 2019-08-16 DIAGNOSIS — G4733 Obstructive sleep apnea (adult) (pediatric): Secondary | ICD-10-CM | POA: Diagnosis not present

## 2019-08-16 DIAGNOSIS — I10 Essential (primary) hypertension: Secondary | ICD-10-CM | POA: Diagnosis not present

## 2019-08-16 DIAGNOSIS — G8918 Other acute postprocedural pain: Secondary | ICD-10-CM | POA: Diagnosis not present

## 2019-08-16 DIAGNOSIS — Z792 Long term (current) use of antibiotics: Secondary | ICD-10-CM | POA: Diagnosis not present

## 2019-08-16 DIAGNOSIS — E785 Hyperlipidemia, unspecified: Secondary | ICD-10-CM | POA: Diagnosis not present

## 2019-08-16 DIAGNOSIS — H9190 Unspecified hearing loss, unspecified ear: Secondary | ICD-10-CM | POA: Diagnosis not present

## 2019-08-17 DIAGNOSIS — I96 Gangrene, not elsewhere classified: Secondary | ICD-10-CM | POA: Diagnosis present

## 2019-08-17 DIAGNOSIS — R339 Retention of urine, unspecified: Secondary | ICD-10-CM | POA: Diagnosis present

## 2019-08-17 DIAGNOSIS — T8453XA Infection and inflammatory reaction due to internal right knee prosthesis, initial encounter: Secondary | ICD-10-CM | POA: Diagnosis present

## 2019-08-17 DIAGNOSIS — I10 Essential (primary) hypertension: Secondary | ICD-10-CM | POA: Diagnosis present

## 2019-08-17 DIAGNOSIS — T8459XA Infection and inflammatory reaction due to other internal joint prosthesis, initial encounter: Secondary | ICD-10-CM | POA: Diagnosis not present

## 2019-08-17 DIAGNOSIS — B965 Pseudomonas (aeruginosa) (mallei) (pseudomallei) as the cause of diseases classified elsewhere: Secondary | ICD-10-CM | POA: Diagnosis present

## 2019-08-17 DIAGNOSIS — Z01818 Encounter for other preprocedural examination: Secondary | ICD-10-CM | POA: Diagnosis not present

## 2019-08-17 DIAGNOSIS — Z471 Aftercare following joint replacement surgery: Secondary | ICD-10-CM | POA: Diagnosis not present

## 2019-08-17 DIAGNOSIS — E871 Hypo-osmolality and hyponatremia: Secondary | ICD-10-CM | POA: Diagnosis present

## 2019-08-17 DIAGNOSIS — Z96651 Presence of right artificial knee joint: Secondary | ICD-10-CM | POA: Diagnosis not present

## 2019-08-17 DIAGNOSIS — T8131XA Disruption of external operation (surgical) wound, not elsewhere classified, initial encounter: Secondary | ICD-10-CM | POA: Diagnosis present

## 2019-08-17 DIAGNOSIS — Z0000 Encounter for general adult medical examination without abnormal findings: Secondary | ICD-10-CM | POA: Diagnosis not present

## 2019-08-17 DIAGNOSIS — M25561 Pain in right knee: Secondary | ICD-10-CM | POA: Diagnosis not present

## 2019-08-17 DIAGNOSIS — T8450XA Infection and inflammatory reaction due to unspecified internal joint prosthesis, initial encounter: Secondary | ICD-10-CM | POA: Diagnosis not present

## 2019-08-17 DIAGNOSIS — T84012A Broken internal right knee prosthesis, initial encounter: Secondary | ICD-10-CM | POA: Diagnosis present

## 2019-08-17 DIAGNOSIS — K5900 Constipation, unspecified: Secondary | ICD-10-CM | POA: Diagnosis present

## 2019-08-17 DIAGNOSIS — Z20822 Contact with and (suspected) exposure to covid-19: Secondary | ICD-10-CM | POA: Diagnosis present

## 2019-08-17 DIAGNOSIS — T86821 Skin graft (allograft) (autograft) failure: Secondary | ICD-10-CM | POA: Diagnosis present

## 2019-08-17 DIAGNOSIS — B379 Candidiasis, unspecified: Secondary | ICD-10-CM | POA: Diagnosis present

## 2019-08-18 ENCOUNTER — Ambulatory Visit: Payer: Medicare Other | Admitting: Surgical

## 2019-08-22 DIAGNOSIS — M25561 Pain in right knee: Secondary | ICD-10-CM | POA: Diagnosis not present

## 2019-08-23 ENCOUNTER — Telehealth: Payer: Self-pay | Admitting: *Deleted

## 2019-08-23 DIAGNOSIS — T8459XA Infection and inflammatory reaction due to other internal joint prosthesis, initial encounter: Secondary | ICD-10-CM | POA: Diagnosis not present

## 2019-08-23 NOTE — Telephone Encounter (Signed)
Devin [name] - ID consultants, [name] called.  Patient is being discharged today from [facility] in [place].  She is faxing over records to [phone].  She can be reached with questions at [phone] (cell). Records not available via CareEverywhere ([facility] not on EPIC).  Patient is scheduled for follow up with Dr Daiva Eves 08/31/19 at 4:00. Andree Coss, RN

## 2019-08-24 DIAGNOSIS — T8453XA Infection and inflammatory reaction due to internal right knee prosthesis, initial encounter: Secondary | ICD-10-CM | POA: Diagnosis not present

## 2019-08-24 DIAGNOSIS — E785 Hyperlipidemia, unspecified: Secondary | ICD-10-CM | POA: Diagnosis not present

## 2019-08-24 DIAGNOSIS — H9190 Unspecified hearing loss, unspecified ear: Secondary | ICD-10-CM | POA: Diagnosis not present

## 2019-08-24 DIAGNOSIS — G8918 Other acute postprocedural pain: Secondary | ICD-10-CM | POA: Diagnosis not present

## 2019-08-24 DIAGNOSIS — I10 Essential (primary) hypertension: Secondary | ICD-10-CM | POA: Diagnosis not present

## 2019-08-24 DIAGNOSIS — G4733 Obstructive sleep apnea (adult) (pediatric): Secondary | ICD-10-CM | POA: Diagnosis not present

## 2019-08-29 DIAGNOSIS — Z471 Aftercare following joint replacement surgery: Secondary | ICD-10-CM | POA: Diagnosis not present

## 2019-08-30 DIAGNOSIS — T8453XA Infection and inflammatory reaction due to internal right knee prosthesis, initial encounter: Secondary | ICD-10-CM | POA: Diagnosis not present

## 2019-08-30 DIAGNOSIS — G8918 Other acute postprocedural pain: Secondary | ICD-10-CM | POA: Diagnosis not present

## 2019-08-30 DIAGNOSIS — G4733 Obstructive sleep apnea (adult) (pediatric): Secondary | ICD-10-CM | POA: Diagnosis not present

## 2019-08-30 DIAGNOSIS — I10 Essential (primary) hypertension: Secondary | ICD-10-CM | POA: Diagnosis not present

## 2019-08-30 DIAGNOSIS — H9190 Unspecified hearing loss, unspecified ear: Secondary | ICD-10-CM | POA: Diagnosis not present

## 2019-08-30 DIAGNOSIS — E785 Hyperlipidemia, unspecified: Secondary | ICD-10-CM | POA: Diagnosis not present

## 2019-08-30 NOTE — Telephone Encounter (Signed)
Discussed with her today apparently the patient underwent resection of his arthroplasty with temporary fusion placement of antibiotic beads Candida parapsilosis and Pseudomonas were isolated

## 2019-08-30 NOTE — Anesthesia Postprocedure Evaluation (Signed)
Anesthesia Post Note  Patient: Devin [name]  Procedure(s) Performed: TOTAL KNEE REVISION   INCISION AND DRAINAGE WITH POLY EXCHANGE (Right Knee) GASTROC FLAP WITH THICKNESS SKINGRAFT TO RIGHT KNEE (Right )     Patient location during evaluation: PACU Anesthesia Type: General Level of consciousness: sedated Pain management: pain level controlled Vital Signs Assessment: post-procedure vital signs reviewed and stable Respiratory status: spontaneous breathing Cardiovascular status: stable Postop Assessment: no apparent nausea or vomiting Anesthetic complications: no    Last Vitals:  Vitals:   08/11/19 0742 08/11/19 1530  BP: 129/78 (!) 142/69  Pulse: 82 79  Resp: 16 18  Temp: 37 C 37.2 C  SpO2: 93% 96%    Last Pain:  Vitals:   08/11/19 1530  TempSrc: Oral  PainSc:    Pain Goal: Patients Stated Pain Goal: 3 (08/11/19 1112)                 Caren Macadam

## 2019-08-31 ENCOUNTER — Inpatient Hospital Stay: Payer: Medicare Other | Admitting: Infectious Disease

## 2019-08-31 DIAGNOSIS — Z792 Long term (current) use of antibiotics: Secondary | ICD-10-CM | POA: Diagnosis not present

## 2019-08-31 DIAGNOSIS — T8453XA Infection and inflammatory reaction due to internal right knee prosthesis, initial encounter: Secondary | ICD-10-CM | POA: Diagnosis not present

## 2019-09-05 DIAGNOSIS — G8918 Other acute postprocedural pain: Secondary | ICD-10-CM | POA: Diagnosis not present

## 2019-09-05 DIAGNOSIS — I10 Essential (primary) hypertension: Secondary | ICD-10-CM | POA: Diagnosis not present

## 2019-09-05 DIAGNOSIS — T8453XA Infection and inflammatory reaction due to internal right knee prosthesis, initial encounter: Secondary | ICD-10-CM | POA: Diagnosis not present

## 2019-09-05 DIAGNOSIS — H9190 Unspecified hearing loss, unspecified ear: Secondary | ICD-10-CM | POA: Diagnosis not present

## 2019-09-05 DIAGNOSIS — F3341 Major depressive disorder, recurrent, in partial remission: Secondary | ICD-10-CM | POA: Diagnosis not present

## 2019-09-05 DIAGNOSIS — R630 Anorexia: Secondary | ICD-10-CM | POA: Diagnosis not present

## 2019-09-05 DIAGNOSIS — G4733 Obstructive sleep apnea (adult) (pediatric): Secondary | ICD-10-CM | POA: Diagnosis not present

## 2019-09-05 DIAGNOSIS — E785 Hyperlipidemia, unspecified: Secondary | ICD-10-CM | POA: Diagnosis not present

## 2019-09-06 DIAGNOSIS — Z452 Encounter for adjustment and management of vascular access device: Secondary | ICD-10-CM | POA: Diagnosis not present

## 2019-09-06 DIAGNOSIS — I10 Essential (primary) hypertension: Secondary | ICD-10-CM | POA: Diagnosis not present

## 2019-09-06 DIAGNOSIS — T8131XA Disruption of external operation (surgical) wound, not elsewhere classified, initial encounter: Secondary | ICD-10-CM | POA: Diagnosis not present

## 2019-09-07 DIAGNOSIS — D509 Iron deficiency anemia, unspecified: Secondary | ICD-10-CM | POA: Diagnosis present

## 2019-09-07 DIAGNOSIS — R627 Adult failure to thrive: Secondary | ICD-10-CM | POA: Diagnosis present

## 2019-09-07 DIAGNOSIS — E860 Dehydration: Secondary | ICD-10-CM | POA: Diagnosis present

## 2019-09-07 DIAGNOSIS — T361X5A Adverse effect of cephalosporins and other beta-lactam antibiotics, initial encounter: Secondary | ICD-10-CM | POA: Diagnosis present

## 2019-09-07 DIAGNOSIS — Z8616 Personal history of COVID-19: Secondary | ICD-10-CM | POA: Diagnosis not present

## 2019-09-07 DIAGNOSIS — T8459XA Infection and inflammatory reaction due to other internal joint prosthesis, initial encounter: Secondary | ICD-10-CM | POA: Diagnosis not present

## 2019-09-07 DIAGNOSIS — D649 Anemia, unspecified: Secondary | ICD-10-CM | POA: Diagnosis not present

## 2019-09-07 DIAGNOSIS — G92 Toxic encephalopathy: Secondary | ICD-10-CM | POA: Diagnosis present

## 2019-09-07 DIAGNOSIS — G9340 Encephalopathy, unspecified: Secondary | ICD-10-CM | POA: Diagnosis not present

## 2019-09-07 DIAGNOSIS — T43595A Adverse effect of other antipsychotics and neuroleptics, initial encounter: Secondary | ICD-10-CM | POA: Diagnosis present

## 2019-09-07 DIAGNOSIS — I10 Essential (primary) hypertension: Secondary | ICD-10-CM | POA: Diagnosis present

## 2019-09-07 DIAGNOSIS — E538 Deficiency of other specified B group vitamins: Secondary | ICD-10-CM | POA: Diagnosis present

## 2019-09-07 DIAGNOSIS — D638 Anemia in other chronic diseases classified elsewhere: Secondary | ICD-10-CM | POA: Diagnosis present

## 2019-09-07 DIAGNOSIS — Z7982 Long term (current) use of aspirin: Secondary | ICD-10-CM | POA: Diagnosis not present

## 2019-09-07 DIAGNOSIS — R109 Unspecified abdominal pain: Secondary | ICD-10-CM | POA: Diagnosis not present

## 2019-09-07 DIAGNOSIS — H9190 Unspecified hearing loss, unspecified ear: Secondary | ICD-10-CM | POA: Diagnosis present

## 2019-09-07 DIAGNOSIS — T8453XA Infection and inflammatory reaction due to internal right knee prosthesis, initial encounter: Secondary | ICD-10-CM | POA: Diagnosis present

## 2019-09-07 DIAGNOSIS — N179 Acute kidney failure, unspecified: Secondary | ICD-10-CM | POA: Diagnosis present

## 2019-09-07 DIAGNOSIS — R5381 Other malaise: Secondary | ICD-10-CM | POA: Diagnosis present

## 2019-09-07 DIAGNOSIS — K5900 Constipation, unspecified: Secondary | ICD-10-CM | POA: Diagnosis present

## 2019-09-07 DIAGNOSIS — R630 Anorexia: Secondary | ICD-10-CM | POA: Diagnosis present

## 2019-09-07 DIAGNOSIS — F329 Major depressive disorder, single episode, unspecified: Secondary | ICD-10-CM | POA: Diagnosis present

## 2019-09-07 DIAGNOSIS — T86821 Skin graft (allograft) (autograft) failure: Secondary | ICD-10-CM | POA: Diagnosis not present

## 2019-09-07 DIAGNOSIS — Z452 Encounter for adjustment and management of vascular access device: Secondary | ICD-10-CM | POA: Diagnosis not present

## 2019-09-07 DIAGNOSIS — Z6822 Body mass index (BMI) 22.0-22.9, adult: Secondary | ICD-10-CM | POA: Diagnosis not present

## 2019-09-07 DIAGNOSIS — E43 Unspecified severe protein-calorie malnutrition: Secondary | ICD-10-CM | POA: Diagnosis present

## 2019-09-07 DIAGNOSIS — E871 Hypo-osmolality and hyponatremia: Secondary | ICD-10-CM | POA: Diagnosis present

## 2019-09-07 DIAGNOSIS — R471 Dysarthria and anarthria: Secondary | ICD-10-CM | POA: Diagnosis not present

## 2019-09-12 ENCOUNTER — Telehealth: Payer: Self-pay | Admitting: Infectious Disease

## 2019-09-12 NOTE — Telephone Encounter (Signed)
RN notified Dr Bartholomew Crews office, confirmed fax number and sent labs. They have more recent lab results drawn at their office as well. Andree Coss, RN

## 2019-09-12 NOTE — Telephone Encounter (Signed)
I have noticed that the patient's basic metabolic panels consistently shown to have a sodium of 128 which is slightly hyponatremic can you route to the PCP for work-up for this

## 2019-09-16 DIAGNOSIS — Z452 Encounter for adjustment and management of vascular access device: Secondary | ICD-10-CM | POA: Diagnosis not present

## 2019-09-16 DIAGNOSIS — Z792 Long term (current) use of antibiotics: Secondary | ICD-10-CM | POA: Diagnosis not present

## 2019-09-16 DIAGNOSIS — F329 Major depressive disorder, single episode, unspecified: Secondary | ICD-10-CM | POA: Diagnosis not present

## 2019-09-16 DIAGNOSIS — R630 Anorexia: Secondary | ICD-10-CM | POA: Diagnosis not present

## 2019-09-16 DIAGNOSIS — G4733 Obstructive sleep apnea (adult) (pediatric): Secondary | ICD-10-CM | POA: Diagnosis not present

## 2019-09-16 DIAGNOSIS — I10 Essential (primary) hypertension: Secondary | ICD-10-CM | POA: Diagnosis not present

## 2019-09-16 DIAGNOSIS — T8459XA Infection and inflammatory reaction due to other internal joint prosthesis, initial encounter: Secondary | ICD-10-CM | POA: Diagnosis not present

## 2019-09-16 DIAGNOSIS — Z9181 History of falling: Secondary | ICD-10-CM | POA: Diagnosis not present

## 2019-09-16 DIAGNOSIS — R627 Adult failure to thrive: Secondary | ICD-10-CM | POA: Diagnosis not present

## 2019-09-16 DIAGNOSIS — E538 Deficiency of other specified B group vitamins: Secondary | ICD-10-CM | POA: Diagnosis not present

## 2019-09-16 DIAGNOSIS — N179 Acute kidney failure, unspecified: Secondary | ICD-10-CM | POA: Diagnosis not present

## 2019-09-16 DIAGNOSIS — D649 Anemia, unspecified: Secondary | ICD-10-CM | POA: Diagnosis not present

## 2019-09-16 DIAGNOSIS — H9190 Unspecified hearing loss, unspecified ear: Secondary | ICD-10-CM | POA: Diagnosis not present

## 2019-09-16 DIAGNOSIS — E785 Hyperlipidemia, unspecified: Secondary | ICD-10-CM | POA: Diagnosis not present

## 2019-09-19 DIAGNOSIS — R627 Adult failure to thrive: Secondary | ICD-10-CM | POA: Diagnosis not present

## 2019-09-19 DIAGNOSIS — T8459XA Infection and inflammatory reaction due to other internal joint prosthesis, initial encounter: Secondary | ICD-10-CM | POA: Diagnosis not present

## 2019-09-19 DIAGNOSIS — Z792 Long term (current) use of antibiotics: Secondary | ICD-10-CM | POA: Diagnosis not present

## 2019-09-19 DIAGNOSIS — E538 Deficiency of other specified B group vitamins: Secondary | ICD-10-CM | POA: Diagnosis not present

## 2019-09-19 DIAGNOSIS — F329 Major depressive disorder, single episode, unspecified: Secondary | ICD-10-CM | POA: Diagnosis not present

## 2019-09-19 DIAGNOSIS — N179 Acute kidney failure, unspecified: Secondary | ICD-10-CM | POA: Diagnosis not present

## 2019-09-19 DIAGNOSIS — R630 Anorexia: Secondary | ICD-10-CM | POA: Diagnosis not present

## 2019-09-22 DIAGNOSIS — T8459XA Infection and inflammatory reaction due to other internal joint prosthesis, initial encounter: Secondary | ICD-10-CM | POA: Diagnosis not present

## 2019-09-22 DIAGNOSIS — F329 Major depressive disorder, single episode, unspecified: Secondary | ICD-10-CM | POA: Diagnosis not present

## 2019-09-22 DIAGNOSIS — R627 Adult failure to thrive: Secondary | ICD-10-CM | POA: Diagnosis not present

## 2019-09-22 DIAGNOSIS — E538 Deficiency of other specified B group vitamins: Secondary | ICD-10-CM | POA: Diagnosis not present

## 2019-09-22 DIAGNOSIS — N179 Acute kidney failure, unspecified: Secondary | ICD-10-CM | POA: Diagnosis not present

## 2019-09-22 DIAGNOSIS — R630 Anorexia: Secondary | ICD-10-CM | POA: Diagnosis not present

## 2019-09-26 DIAGNOSIS — T8450XA Infection and inflammatory reaction due to unspecified internal joint prosthesis, initial encounter: Secondary | ICD-10-CM | POA: Diagnosis not present

## 2019-09-26 DIAGNOSIS — E538 Deficiency of other specified B group vitamins: Secondary | ICD-10-CM | POA: Diagnosis not present

## 2019-09-26 DIAGNOSIS — R627 Adult failure to thrive: Secondary | ICD-10-CM | POA: Diagnosis not present

## 2019-09-26 DIAGNOSIS — N179 Acute kidney failure, unspecified: Secondary | ICD-10-CM | POA: Diagnosis not present

## 2019-09-26 DIAGNOSIS — R630 Anorexia: Secondary | ICD-10-CM | POA: Diagnosis not present

## 2019-09-26 DIAGNOSIS — F329 Major depressive disorder, single episode, unspecified: Secondary | ICD-10-CM | POA: Diagnosis not present

## 2019-09-26 DIAGNOSIS — T8459XA Infection and inflammatory reaction due to other internal joint prosthesis, initial encounter: Secondary | ICD-10-CM | POA: Diagnosis not present

## 2019-09-28 DIAGNOSIS — R627 Adult failure to thrive: Secondary | ICD-10-CM | POA: Diagnosis not present

## 2019-09-28 DIAGNOSIS — N179 Acute kidney failure, unspecified: Secondary | ICD-10-CM | POA: Diagnosis not present

## 2019-09-28 DIAGNOSIS — T8459XA Infection and inflammatory reaction due to other internal joint prosthesis, initial encounter: Secondary | ICD-10-CM | POA: Diagnosis not present

## 2019-09-28 DIAGNOSIS — E538 Deficiency of other specified B group vitamins: Secondary | ICD-10-CM | POA: Diagnosis not present

## 2019-09-28 DIAGNOSIS — R630 Anorexia: Secondary | ICD-10-CM | POA: Diagnosis not present

## 2019-09-28 DIAGNOSIS — F329 Major depressive disorder, single episode, unspecified: Secondary | ICD-10-CM | POA: Diagnosis not present

## 2019-09-29 DIAGNOSIS — T8459XA Infection and inflammatory reaction due to other internal joint prosthesis, initial encounter: Secondary | ICD-10-CM | POA: Diagnosis not present

## 2019-10-03 DIAGNOSIS — T8459XA Infection and inflammatory reaction due to other internal joint prosthesis, initial encounter: Secondary | ICD-10-CM | POA: Diagnosis not present

## 2019-10-03 DIAGNOSIS — E538 Deficiency of other specified B group vitamins: Secondary | ICD-10-CM | POA: Diagnosis not present

## 2019-10-03 DIAGNOSIS — T8450XA Infection and inflammatory reaction due to unspecified internal joint prosthesis, initial encounter: Secondary | ICD-10-CM | POA: Diagnosis not present

## 2019-10-03 DIAGNOSIS — R630 Anorexia: Secondary | ICD-10-CM | POA: Diagnosis not present

## 2019-10-03 DIAGNOSIS — N179 Acute kidney failure, unspecified: Secondary | ICD-10-CM | POA: Diagnosis not present

## 2019-10-03 DIAGNOSIS — F329 Major depressive disorder, single episode, unspecified: Secondary | ICD-10-CM | POA: Diagnosis not present

## 2019-10-03 DIAGNOSIS — R627 Adult failure to thrive: Secondary | ICD-10-CM | POA: Diagnosis not present

## 2019-10-05 DIAGNOSIS — T8459XA Infection and inflammatory reaction due to other internal joint prosthesis, initial encounter: Secondary | ICD-10-CM | POA: Diagnosis not present

## 2019-10-05 DIAGNOSIS — Z471 Aftercare following joint replacement surgery: Secondary | ICD-10-CM | POA: Diagnosis not present

## 2019-10-06 DIAGNOSIS — F329 Major depressive disorder, single episode, unspecified: Secondary | ICD-10-CM | POA: Diagnosis not present

## 2019-10-06 DIAGNOSIS — E538 Deficiency of other specified B group vitamins: Secondary | ICD-10-CM | POA: Diagnosis not present

## 2019-10-06 DIAGNOSIS — N179 Acute kidney failure, unspecified: Secondary | ICD-10-CM | POA: Diagnosis not present

## 2019-10-06 DIAGNOSIS — R627 Adult failure to thrive: Secondary | ICD-10-CM | POA: Diagnosis not present

## 2019-10-06 DIAGNOSIS — R630 Anorexia: Secondary | ICD-10-CM | POA: Diagnosis not present

## 2019-10-06 DIAGNOSIS — T8459XA Infection and inflammatory reaction due to other internal joint prosthesis, initial encounter: Secondary | ICD-10-CM | POA: Diagnosis not present

## 2019-10-10 DIAGNOSIS — T8450XA Infection and inflammatory reaction due to unspecified internal joint prosthesis, initial encounter: Secondary | ICD-10-CM | POA: Diagnosis not present

## 2019-10-10 DIAGNOSIS — F329 Major depressive disorder, single episode, unspecified: Secondary | ICD-10-CM | POA: Diagnosis not present

## 2019-10-10 DIAGNOSIS — R627 Adult failure to thrive: Secondary | ICD-10-CM | POA: Diagnosis not present

## 2019-10-10 DIAGNOSIS — R630 Anorexia: Secondary | ICD-10-CM | POA: Diagnosis not present

## 2019-10-10 DIAGNOSIS — N179 Acute kidney failure, unspecified: Secondary | ICD-10-CM | POA: Diagnosis not present

## 2019-10-10 DIAGNOSIS — E538 Deficiency of other specified B group vitamins: Secondary | ICD-10-CM | POA: Diagnosis not present

## 2019-10-10 DIAGNOSIS — T8459XA Infection and inflammatory reaction due to other internal joint prosthesis, initial encounter: Secondary | ICD-10-CM | POA: Diagnosis not present

## 2019-10-13 DIAGNOSIS — F329 Major depressive disorder, single episode, unspecified: Secondary | ICD-10-CM | POA: Diagnosis not present

## 2019-10-13 DIAGNOSIS — N179 Acute kidney failure, unspecified: Secondary | ICD-10-CM | POA: Diagnosis not present

## 2019-10-13 DIAGNOSIS — T8459XA Infection and inflammatory reaction due to other internal joint prosthesis, initial encounter: Secondary | ICD-10-CM | POA: Diagnosis not present

## 2019-10-13 DIAGNOSIS — E538 Deficiency of other specified B group vitamins: Secondary | ICD-10-CM | POA: Diagnosis not present

## 2019-10-13 DIAGNOSIS — R627 Adult failure to thrive: Secondary | ICD-10-CM | POA: Diagnosis not present

## 2019-10-13 DIAGNOSIS — R630 Anorexia: Secondary | ICD-10-CM | POA: Diagnosis not present

## 2019-10-14 DIAGNOSIS — R627 Adult failure to thrive: Secondary | ICD-10-CM | POA: Diagnosis not present

## 2019-10-14 DIAGNOSIS — T8459XA Infection and inflammatory reaction due to other internal joint prosthesis, initial encounter: Secondary | ICD-10-CM | POA: Diagnosis not present

## 2019-10-14 DIAGNOSIS — E538 Deficiency of other specified B group vitamins: Secondary | ICD-10-CM | POA: Diagnosis not present

## 2019-10-14 DIAGNOSIS — N179 Acute kidney failure, unspecified: Secondary | ICD-10-CM | POA: Diagnosis not present

## 2019-10-14 DIAGNOSIS — R630 Anorexia: Secondary | ICD-10-CM | POA: Diagnosis not present

## 2019-10-14 DIAGNOSIS — F329 Major depressive disorder, single episode, unspecified: Secondary | ICD-10-CM | POA: Diagnosis not present

## 2019-10-16 DIAGNOSIS — F329 Major depressive disorder, single episode, unspecified: Secondary | ICD-10-CM | POA: Diagnosis not present

## 2019-10-16 DIAGNOSIS — T8459XA Infection and inflammatory reaction due to other internal joint prosthesis, initial encounter: Secondary | ICD-10-CM | POA: Diagnosis not present

## 2019-10-16 DIAGNOSIS — D649 Anemia, unspecified: Secondary | ICD-10-CM | POA: Diagnosis not present

## 2019-10-16 DIAGNOSIS — R630 Anorexia: Secondary | ICD-10-CM | POA: Diagnosis not present

## 2019-10-16 DIAGNOSIS — E538 Deficiency of other specified B group vitamins: Secondary | ICD-10-CM | POA: Diagnosis not present

## 2019-10-16 DIAGNOSIS — G4733 Obstructive sleep apnea (adult) (pediatric): Secondary | ICD-10-CM | POA: Diagnosis not present

## 2019-10-16 DIAGNOSIS — E785 Hyperlipidemia, unspecified: Secondary | ICD-10-CM | POA: Diagnosis not present

## 2019-10-16 DIAGNOSIS — I10 Essential (primary) hypertension: Secondary | ICD-10-CM | POA: Diagnosis not present

## 2019-10-16 DIAGNOSIS — Z792 Long term (current) use of antibiotics: Secondary | ICD-10-CM | POA: Diagnosis not present

## 2019-10-16 DIAGNOSIS — R627 Adult failure to thrive: Secondary | ICD-10-CM | POA: Diagnosis not present

## 2019-10-16 DIAGNOSIS — Z452 Encounter for adjustment and management of vascular access device: Secondary | ICD-10-CM | POA: Diagnosis not present

## 2019-10-16 DIAGNOSIS — Z9181 History of falling: Secondary | ICD-10-CM | POA: Diagnosis not present

## 2019-10-16 DIAGNOSIS — N179 Acute kidney failure, unspecified: Secondary | ICD-10-CM | POA: Diagnosis not present

## 2019-10-16 DIAGNOSIS — H9190 Unspecified hearing loss, unspecified ear: Secondary | ICD-10-CM | POA: Diagnosis not present

## 2019-10-17 DIAGNOSIS — R627 Adult failure to thrive: Secondary | ICD-10-CM | POA: Diagnosis not present

## 2019-10-17 DIAGNOSIS — E538 Deficiency of other specified B group vitamins: Secondary | ICD-10-CM | POA: Diagnosis not present

## 2019-10-17 DIAGNOSIS — F329 Major depressive disorder, single episode, unspecified: Secondary | ICD-10-CM | POA: Diagnosis not present

## 2019-10-17 DIAGNOSIS — N179 Acute kidney failure, unspecified: Secondary | ICD-10-CM | POA: Diagnosis not present

## 2019-10-17 DIAGNOSIS — T8459XA Infection and inflammatory reaction due to other internal joint prosthesis, initial encounter: Secondary | ICD-10-CM | POA: Diagnosis not present

## 2019-10-17 DIAGNOSIS — R630 Anorexia: Secondary | ICD-10-CM | POA: Diagnosis not present

## 2019-10-19 DIAGNOSIS — R630 Anorexia: Secondary | ICD-10-CM | POA: Diagnosis not present

## 2019-10-19 DIAGNOSIS — N179 Acute kidney failure, unspecified: Secondary | ICD-10-CM | POA: Diagnosis not present

## 2019-10-19 DIAGNOSIS — R627 Adult failure to thrive: Secondary | ICD-10-CM | POA: Diagnosis not present

## 2019-10-19 DIAGNOSIS — F329 Major depressive disorder, single episode, unspecified: Secondary | ICD-10-CM | POA: Diagnosis not present

## 2019-10-19 DIAGNOSIS — E538 Deficiency of other specified B group vitamins: Secondary | ICD-10-CM | POA: Diagnosis not present

## 2019-10-19 DIAGNOSIS — T8459XA Infection and inflammatory reaction due to other internal joint prosthesis, initial encounter: Secondary | ICD-10-CM | POA: Diagnosis not present

## 2019-10-21 DIAGNOSIS — R630 Anorexia: Secondary | ICD-10-CM | POA: Diagnosis not present

## 2019-10-21 DIAGNOSIS — N390 Urinary tract infection, site not specified: Secondary | ICD-10-CM | POA: Diagnosis not present

## 2019-10-21 DIAGNOSIS — N179 Acute kidney failure, unspecified: Secondary | ICD-10-CM | POA: Diagnosis not present

## 2019-10-21 DIAGNOSIS — T8459XA Infection and inflammatory reaction due to other internal joint prosthesis, initial encounter: Secondary | ICD-10-CM | POA: Diagnosis not present

## 2019-10-21 DIAGNOSIS — F329 Major depressive disorder, single episode, unspecified: Secondary | ICD-10-CM | POA: Diagnosis not present

## 2019-10-21 DIAGNOSIS — E538 Deficiency of other specified B group vitamins: Secondary | ICD-10-CM | POA: Diagnosis not present

## 2019-10-21 DIAGNOSIS — R627 Adult failure to thrive: Secondary | ICD-10-CM | POA: Diagnosis not present

## 2019-10-24 DIAGNOSIS — E538 Deficiency of other specified B group vitamins: Secondary | ICD-10-CM | POA: Diagnosis not present

## 2019-10-24 DIAGNOSIS — N179 Acute kidney failure, unspecified: Secondary | ICD-10-CM | POA: Diagnosis not present

## 2019-10-24 DIAGNOSIS — R627 Adult failure to thrive: Secondary | ICD-10-CM | POA: Diagnosis not present

## 2019-10-24 DIAGNOSIS — T8459XA Infection and inflammatory reaction due to other internal joint prosthesis, initial encounter: Secondary | ICD-10-CM | POA: Diagnosis not present

## 2019-10-24 DIAGNOSIS — R630 Anorexia: Secondary | ICD-10-CM | POA: Diagnosis not present

## 2019-10-24 DIAGNOSIS — F329 Major depressive disorder, single episode, unspecified: Secondary | ICD-10-CM | POA: Diagnosis not present

## 2019-10-26 DIAGNOSIS — T8459XA Infection and inflammatory reaction due to other internal joint prosthesis, initial encounter: Secondary | ICD-10-CM | POA: Diagnosis not present

## 2019-10-26 DIAGNOSIS — R630 Anorexia: Secondary | ICD-10-CM | POA: Diagnosis not present

## 2019-10-26 DIAGNOSIS — R627 Adult failure to thrive: Secondary | ICD-10-CM | POA: Diagnosis not present

## 2019-10-26 DIAGNOSIS — N179 Acute kidney failure, unspecified: Secondary | ICD-10-CM | POA: Diagnosis not present

## 2019-10-26 DIAGNOSIS — F329 Major depressive disorder, single episode, unspecified: Secondary | ICD-10-CM | POA: Diagnosis not present

## 2019-10-26 DIAGNOSIS — E538 Deficiency of other specified B group vitamins: Secondary | ICD-10-CM | POA: Diagnosis not present

## 2019-10-31 DIAGNOSIS — T8459XA Infection and inflammatory reaction due to other internal joint prosthesis, initial encounter: Secondary | ICD-10-CM | POA: Diagnosis not present

## 2019-10-31 DIAGNOSIS — N179 Acute kidney failure, unspecified: Secondary | ICD-10-CM | POA: Diagnosis not present

## 2019-10-31 DIAGNOSIS — R627 Adult failure to thrive: Secondary | ICD-10-CM | POA: Diagnosis not present

## 2019-10-31 DIAGNOSIS — E538 Deficiency of other specified B group vitamins: Secondary | ICD-10-CM | POA: Diagnosis not present

## 2019-10-31 DIAGNOSIS — R630 Anorexia: Secondary | ICD-10-CM | POA: Diagnosis not present

## 2019-10-31 DIAGNOSIS — F329 Major depressive disorder, single episode, unspecified: Secondary | ICD-10-CM | POA: Diagnosis not present

## 2019-11-07 DIAGNOSIS — E538 Deficiency of other specified B group vitamins: Secondary | ICD-10-CM | POA: Diagnosis not present

## 2019-11-07 DIAGNOSIS — R627 Adult failure to thrive: Secondary | ICD-10-CM | POA: Diagnosis not present

## 2019-11-07 DIAGNOSIS — T8459XA Infection and inflammatory reaction due to other internal joint prosthesis, initial encounter: Secondary | ICD-10-CM | POA: Diagnosis not present

## 2019-11-07 DIAGNOSIS — R630 Anorexia: Secondary | ICD-10-CM | POA: Diagnosis not present

## 2019-11-07 DIAGNOSIS — N179 Acute kidney failure, unspecified: Secondary | ICD-10-CM | POA: Diagnosis not present

## 2019-11-07 DIAGNOSIS — F329 Major depressive disorder, single episode, unspecified: Secondary | ICD-10-CM | POA: Diagnosis not present

## 2019-11-08 DIAGNOSIS — R630 Anorexia: Secondary | ICD-10-CM | POA: Diagnosis not present

## 2019-11-08 DIAGNOSIS — E538 Deficiency of other specified B group vitamins: Secondary | ICD-10-CM | POA: Diagnosis not present

## 2019-11-08 DIAGNOSIS — F329 Major depressive disorder, single episode, unspecified: Secondary | ICD-10-CM | POA: Diagnosis not present

## 2019-11-08 DIAGNOSIS — T8459XA Infection and inflammatory reaction due to other internal joint prosthesis, initial encounter: Secondary | ICD-10-CM | POA: Diagnosis not present

## 2019-11-08 DIAGNOSIS — R627 Adult failure to thrive: Secondary | ICD-10-CM | POA: Diagnosis not present

## 2019-11-08 DIAGNOSIS — N179 Acute kidney failure, unspecified: Secondary | ICD-10-CM | POA: Diagnosis not present

## 2019-11-10 DIAGNOSIS — T8459XA Infection and inflammatory reaction due to other internal joint prosthesis, initial encounter: Secondary | ICD-10-CM | POA: Diagnosis not present

## 2019-11-11 DIAGNOSIS — T8459XA Infection and inflammatory reaction due to other internal joint prosthesis, initial encounter: Secondary | ICD-10-CM | POA: Diagnosis not present

## 2019-11-11 DIAGNOSIS — R627 Adult failure to thrive: Secondary | ICD-10-CM | POA: Diagnosis not present

## 2019-11-11 DIAGNOSIS — F329 Major depressive disorder, single episode, unspecified: Secondary | ICD-10-CM | POA: Diagnosis not present

## 2019-11-11 DIAGNOSIS — E538 Deficiency of other specified B group vitamins: Secondary | ICD-10-CM | POA: Diagnosis not present

## 2019-11-11 DIAGNOSIS — N179 Acute kidney failure, unspecified: Secondary | ICD-10-CM | POA: Diagnosis not present

## 2019-11-11 DIAGNOSIS — R630 Anorexia: Secondary | ICD-10-CM | POA: Diagnosis not present

## 2019-11-14 DIAGNOSIS — E538 Deficiency of other specified B group vitamins: Secondary | ICD-10-CM | POA: Diagnosis not present

## 2019-11-14 DIAGNOSIS — T8459XA Infection and inflammatory reaction due to other internal joint prosthesis, initial encounter: Secondary | ICD-10-CM | POA: Diagnosis not present

## 2019-11-14 DIAGNOSIS — N179 Acute kidney failure, unspecified: Secondary | ICD-10-CM | POA: Diagnosis not present

## 2019-11-14 DIAGNOSIS — F329 Major depressive disorder, single episode, unspecified: Secondary | ICD-10-CM | POA: Diagnosis not present

## 2019-11-14 DIAGNOSIS — R630 Anorexia: Secondary | ICD-10-CM | POA: Diagnosis not present

## 2019-11-14 DIAGNOSIS — R627 Adult failure to thrive: Secondary | ICD-10-CM | POA: Diagnosis not present

## 2019-11-15 DIAGNOSIS — Z9181 History of falling: Secondary | ICD-10-CM | POA: Diagnosis not present

## 2019-11-15 DIAGNOSIS — Z87440 Personal history of urinary (tract) infections: Secondary | ICD-10-CM | POA: Diagnosis not present

## 2019-11-15 DIAGNOSIS — T8459XA Infection and inflammatory reaction due to other internal joint prosthesis, initial encounter: Secondary | ICD-10-CM | POA: Diagnosis not present

## 2019-11-15 DIAGNOSIS — R627 Adult failure to thrive: Secondary | ICD-10-CM | POA: Diagnosis not present

## 2019-11-15 DIAGNOSIS — I10 Essential (primary) hypertension: Secondary | ICD-10-CM | POA: Diagnosis not present

## 2019-11-15 DIAGNOSIS — F329 Major depressive disorder, single episode, unspecified: Secondary | ICD-10-CM | POA: Diagnosis not present

## 2019-11-15 DIAGNOSIS — R630 Anorexia: Secondary | ICD-10-CM | POA: Diagnosis not present

## 2019-11-15 DIAGNOSIS — T8459XD Infection and inflammatory reaction due to other internal joint prosthesis, subsequent encounter: Secondary | ICD-10-CM | POA: Diagnosis not present

## 2019-11-15 DIAGNOSIS — H9190 Unspecified hearing loss, unspecified ear: Secondary | ICD-10-CM | POA: Diagnosis not present

## 2019-11-15 DIAGNOSIS — D649 Anemia, unspecified: Secondary | ICD-10-CM | POA: Diagnosis not present

## 2019-11-15 DIAGNOSIS — E538 Deficiency of other specified B group vitamins: Secondary | ICD-10-CM | POA: Diagnosis not present

## 2019-11-15 DIAGNOSIS — R262 Difficulty in walking, not elsewhere classified: Secondary | ICD-10-CM | POA: Diagnosis not present

## 2019-11-15 DIAGNOSIS — G4733 Obstructive sleep apnea (adult) (pediatric): Secondary | ICD-10-CM | POA: Diagnosis not present

## 2019-11-15 DIAGNOSIS — E785 Hyperlipidemia, unspecified: Secondary | ICD-10-CM | POA: Diagnosis not present

## 2019-11-17 DIAGNOSIS — E538 Deficiency of other specified B group vitamins: Secondary | ICD-10-CM | POA: Diagnosis not present

## 2019-11-17 DIAGNOSIS — R627 Adult failure to thrive: Secondary | ICD-10-CM | POA: Diagnosis not present

## 2019-11-17 DIAGNOSIS — T8459XA Infection and inflammatory reaction due to other internal joint prosthesis, initial encounter: Secondary | ICD-10-CM | POA: Diagnosis not present

## 2019-11-17 DIAGNOSIS — D649 Anemia, unspecified: Secondary | ICD-10-CM | POA: Diagnosis not present

## 2019-11-17 DIAGNOSIS — R630 Anorexia: Secondary | ICD-10-CM | POA: Diagnosis not present

## 2019-11-17 DIAGNOSIS — F329 Major depressive disorder, single episode, unspecified: Secondary | ICD-10-CM | POA: Diagnosis not present

## 2019-11-20 DIAGNOSIS — M25511 Pain in right shoulder: Secondary | ICD-10-CM | POA: Diagnosis not present

## 2019-11-20 DIAGNOSIS — M25461 Effusion, right knee: Secondary | ICD-10-CM | POA: Diagnosis not present

## 2019-11-21 DIAGNOSIS — E538 Deficiency of other specified B group vitamins: Secondary | ICD-10-CM | POA: Diagnosis not present

## 2019-11-21 DIAGNOSIS — R627 Adult failure to thrive: Secondary | ICD-10-CM | POA: Diagnosis not present

## 2019-11-21 DIAGNOSIS — F329 Major depressive disorder, single episode, unspecified: Secondary | ICD-10-CM | POA: Diagnosis not present

## 2019-11-21 DIAGNOSIS — T8459XA Infection and inflammatory reaction due to other internal joint prosthesis, initial encounter: Secondary | ICD-10-CM | POA: Diagnosis not present

## 2019-11-21 DIAGNOSIS — R630 Anorexia: Secondary | ICD-10-CM | POA: Diagnosis not present

## 2019-11-21 DIAGNOSIS — D649 Anemia, unspecified: Secondary | ICD-10-CM | POA: Diagnosis not present

## 2019-11-22 DIAGNOSIS — T8459XA Infection and inflammatory reaction due to other internal joint prosthesis, initial encounter: Secondary | ICD-10-CM | POA: Diagnosis not present

## 2019-11-22 DIAGNOSIS — R627 Adult failure to thrive: Secondary | ICD-10-CM | POA: Diagnosis not present

## 2019-11-22 DIAGNOSIS — R630 Anorexia: Secondary | ICD-10-CM | POA: Diagnosis not present

## 2019-11-22 DIAGNOSIS — F329 Major depressive disorder, single episode, unspecified: Secondary | ICD-10-CM | POA: Diagnosis not present

## 2019-11-22 DIAGNOSIS — D649 Anemia, unspecified: Secondary | ICD-10-CM | POA: Diagnosis not present

## 2019-11-22 DIAGNOSIS — E538 Deficiency of other specified B group vitamins: Secondary | ICD-10-CM | POA: Diagnosis not present

## 2019-11-23 DIAGNOSIS — D649 Anemia, unspecified: Secondary | ICD-10-CM | POA: Diagnosis not present

## 2019-11-23 DIAGNOSIS — E538 Deficiency of other specified B group vitamins: Secondary | ICD-10-CM | POA: Diagnosis not present

## 2019-11-23 DIAGNOSIS — R630 Anorexia: Secondary | ICD-10-CM | POA: Diagnosis not present

## 2019-11-23 DIAGNOSIS — F329 Major depressive disorder, single episode, unspecified: Secondary | ICD-10-CM | POA: Diagnosis not present

## 2019-11-23 DIAGNOSIS — R627 Adult failure to thrive: Secondary | ICD-10-CM | POA: Diagnosis not present

## 2019-11-23 DIAGNOSIS — T8459XA Infection and inflammatory reaction due to other internal joint prosthesis, initial encounter: Secondary | ICD-10-CM | POA: Diagnosis not present

## 2019-11-28 DIAGNOSIS — F329 Major depressive disorder, single episode, unspecified: Secondary | ICD-10-CM | POA: Diagnosis not present

## 2019-11-28 DIAGNOSIS — R630 Anorexia: Secondary | ICD-10-CM | POA: Diagnosis not present

## 2019-11-28 DIAGNOSIS — E538 Deficiency of other specified B group vitamins: Secondary | ICD-10-CM | POA: Diagnosis not present

## 2019-11-28 DIAGNOSIS — D649 Anemia, unspecified: Secondary | ICD-10-CM | POA: Diagnosis not present

## 2019-11-28 DIAGNOSIS — R627 Adult failure to thrive: Secondary | ICD-10-CM | POA: Diagnosis not present

## 2019-11-28 DIAGNOSIS — T8459XA Infection and inflammatory reaction due to other internal joint prosthesis, initial encounter: Secondary | ICD-10-CM | POA: Diagnosis not present

## 2019-11-30 DIAGNOSIS — F329 Major depressive disorder, single episode, unspecified: Secondary | ICD-10-CM | POA: Diagnosis not present

## 2019-11-30 DIAGNOSIS — R627 Adult failure to thrive: Secondary | ICD-10-CM | POA: Diagnosis not present

## 2019-11-30 DIAGNOSIS — D649 Anemia, unspecified: Secondary | ICD-10-CM | POA: Diagnosis not present

## 2019-11-30 DIAGNOSIS — T8459XA Infection and inflammatory reaction due to other internal joint prosthesis, initial encounter: Secondary | ICD-10-CM | POA: Diagnosis not present

## 2019-11-30 DIAGNOSIS — R630 Anorexia: Secondary | ICD-10-CM | POA: Diagnosis not present

## 2019-11-30 DIAGNOSIS — E538 Deficiency of other specified B group vitamins: Secondary | ICD-10-CM | POA: Diagnosis not present

## 2019-12-01 DIAGNOSIS — E538 Deficiency of other specified B group vitamins: Secondary | ICD-10-CM | POA: Diagnosis not present

## 2019-12-01 DIAGNOSIS — R630 Anorexia: Secondary | ICD-10-CM | POA: Diagnosis not present

## 2019-12-01 DIAGNOSIS — T8459XA Infection and inflammatory reaction due to other internal joint prosthesis, initial encounter: Secondary | ICD-10-CM | POA: Diagnosis not present

## 2019-12-01 DIAGNOSIS — R627 Adult failure to thrive: Secondary | ICD-10-CM | POA: Diagnosis not present

## 2019-12-01 DIAGNOSIS — D649 Anemia, unspecified: Secondary | ICD-10-CM | POA: Diagnosis not present

## 2019-12-01 DIAGNOSIS — F329 Major depressive disorder, single episode, unspecified: Secondary | ICD-10-CM | POA: Diagnosis not present

## 2019-12-05 DIAGNOSIS — T8459XA Infection and inflammatory reaction due to other internal joint prosthesis, initial encounter: Secondary | ICD-10-CM | POA: Diagnosis not present

## 2019-12-05 DIAGNOSIS — D649 Anemia, unspecified: Secondary | ICD-10-CM | POA: Diagnosis not present

## 2019-12-05 DIAGNOSIS — R627 Adult failure to thrive: Secondary | ICD-10-CM | POA: Diagnosis not present

## 2019-12-05 DIAGNOSIS — R630 Anorexia: Secondary | ICD-10-CM | POA: Diagnosis not present

## 2019-12-05 DIAGNOSIS — E538 Deficiency of other specified B group vitamins: Secondary | ICD-10-CM | POA: Diagnosis not present

## 2019-12-05 DIAGNOSIS — F329 Major depressive disorder, single episode, unspecified: Secondary | ICD-10-CM | POA: Diagnosis not present

## 2019-12-06 DIAGNOSIS — R630 Anorexia: Secondary | ICD-10-CM | POA: Diagnosis not present

## 2019-12-06 DIAGNOSIS — E538 Deficiency of other specified B group vitamins: Secondary | ICD-10-CM | POA: Diagnosis not present

## 2019-12-06 DIAGNOSIS — T8459XA Infection and inflammatory reaction due to other internal joint prosthesis, initial encounter: Secondary | ICD-10-CM | POA: Diagnosis not present

## 2019-12-06 DIAGNOSIS — F329 Major depressive disorder, single episode, unspecified: Secondary | ICD-10-CM | POA: Diagnosis not present

## 2019-12-06 DIAGNOSIS — R627 Adult failure to thrive: Secondary | ICD-10-CM | POA: Diagnosis not present

## 2019-12-06 DIAGNOSIS — D649 Anemia, unspecified: Secondary | ICD-10-CM | POA: Diagnosis not present

## 2019-12-07 DIAGNOSIS — R627 Adult failure to thrive: Secondary | ICD-10-CM | POA: Diagnosis not present

## 2019-12-07 DIAGNOSIS — R630 Anorexia: Secondary | ICD-10-CM | POA: Diagnosis not present

## 2019-12-07 DIAGNOSIS — D649 Anemia, unspecified: Secondary | ICD-10-CM | POA: Diagnosis not present

## 2019-12-07 DIAGNOSIS — E538 Deficiency of other specified B group vitamins: Secondary | ICD-10-CM | POA: Diagnosis not present

## 2019-12-07 DIAGNOSIS — F329 Major depressive disorder, single episode, unspecified: Secondary | ICD-10-CM | POA: Diagnosis not present

## 2019-12-07 DIAGNOSIS — T8459XA Infection and inflammatory reaction due to other internal joint prosthesis, initial encounter: Secondary | ICD-10-CM | POA: Diagnosis not present

## 2019-12-07 DIAGNOSIS — Z471 Aftercare following joint replacement surgery: Secondary | ICD-10-CM | POA: Diagnosis not present

## 2019-12-12 DIAGNOSIS — E538 Deficiency of other specified B group vitamins: Secondary | ICD-10-CM | POA: Diagnosis not present

## 2019-12-12 DIAGNOSIS — T8459XA Infection and inflammatory reaction due to other internal joint prosthesis, initial encounter: Secondary | ICD-10-CM | POA: Diagnosis not present

## 2019-12-12 DIAGNOSIS — D649 Anemia, unspecified: Secondary | ICD-10-CM | POA: Diagnosis not present

## 2019-12-12 DIAGNOSIS — R630 Anorexia: Secondary | ICD-10-CM | POA: Diagnosis not present

## 2019-12-12 DIAGNOSIS — F329 Major depressive disorder, single episode, unspecified: Secondary | ICD-10-CM | POA: Diagnosis not present

## 2019-12-12 DIAGNOSIS — R627 Adult failure to thrive: Secondary | ICD-10-CM | POA: Diagnosis not present

## 2019-12-14 DIAGNOSIS — Z471 Aftercare following joint replacement surgery: Secondary | ICD-10-CM | POA: Diagnosis not present

## 2019-12-15 DIAGNOSIS — Z87440 Personal history of urinary (tract) infections: Secondary | ICD-10-CM | POA: Diagnosis not present

## 2019-12-15 DIAGNOSIS — R630 Anorexia: Secondary | ICD-10-CM | POA: Diagnosis not present

## 2019-12-15 DIAGNOSIS — E785 Hyperlipidemia, unspecified: Secondary | ICD-10-CM | POA: Diagnosis not present

## 2019-12-15 DIAGNOSIS — I10 Essential (primary) hypertension: Secondary | ICD-10-CM | POA: Diagnosis not present

## 2019-12-15 DIAGNOSIS — G4733 Obstructive sleep apnea (adult) (pediatric): Secondary | ICD-10-CM | POA: Diagnosis not present

## 2019-12-15 DIAGNOSIS — E538 Deficiency of other specified B group vitamins: Secondary | ICD-10-CM | POA: Diagnosis not present

## 2019-12-15 DIAGNOSIS — Z9181 History of falling: Secondary | ICD-10-CM | POA: Diagnosis not present

## 2019-12-15 DIAGNOSIS — H9190 Unspecified hearing loss, unspecified ear: Secondary | ICD-10-CM | POA: Diagnosis not present

## 2019-12-15 DIAGNOSIS — F329 Major depressive disorder, single episode, unspecified: Secondary | ICD-10-CM | POA: Diagnosis not present

## 2019-12-15 DIAGNOSIS — T8459XD Infection and inflammatory reaction due to other internal joint prosthesis, subsequent encounter: Secondary | ICD-10-CM | POA: Diagnosis not present

## 2019-12-15 DIAGNOSIS — D649 Anemia, unspecified: Secondary | ICD-10-CM | POA: Diagnosis not present

## 2019-12-15 DIAGNOSIS — R262 Difficulty in walking, not elsewhere classified: Secondary | ICD-10-CM | POA: Diagnosis not present

## 2019-12-15 DIAGNOSIS — T8459XA Infection and inflammatory reaction due to other internal joint prosthesis, initial encounter: Secondary | ICD-10-CM | POA: Diagnosis not present

## 2019-12-15 DIAGNOSIS — R627 Adult failure to thrive: Secondary | ICD-10-CM | POA: Diagnosis not present

## 2019-12-20 DIAGNOSIS — E538 Deficiency of other specified B group vitamins: Secondary | ICD-10-CM | POA: Diagnosis not present

## 2019-12-20 DIAGNOSIS — R630 Anorexia: Secondary | ICD-10-CM | POA: Diagnosis not present

## 2019-12-20 DIAGNOSIS — R627 Adult failure to thrive: Secondary | ICD-10-CM | POA: Diagnosis not present

## 2019-12-20 DIAGNOSIS — T8459XA Infection and inflammatory reaction due to other internal joint prosthesis, initial encounter: Secondary | ICD-10-CM | POA: Diagnosis not present

## 2019-12-20 DIAGNOSIS — F329 Major depressive disorder, single episode, unspecified: Secondary | ICD-10-CM | POA: Diagnosis not present

## 2019-12-20 DIAGNOSIS — D649 Anemia, unspecified: Secondary | ICD-10-CM | POA: Diagnosis not present

## 2019-12-29 DIAGNOSIS — F329 Major depressive disorder, single episode, unspecified: Secondary | ICD-10-CM | POA: Diagnosis not present

## 2019-12-29 DIAGNOSIS — D649 Anemia, unspecified: Secondary | ICD-10-CM | POA: Diagnosis not present

## 2019-12-29 DIAGNOSIS — R630 Anorexia: Secondary | ICD-10-CM | POA: Diagnosis not present

## 2019-12-29 DIAGNOSIS — E538 Deficiency of other specified B group vitamins: Secondary | ICD-10-CM | POA: Diagnosis not present

## 2019-12-29 DIAGNOSIS — T8459XA Infection and inflammatory reaction due to other internal joint prosthesis, initial encounter: Secondary | ICD-10-CM | POA: Diagnosis not present

## 2019-12-29 DIAGNOSIS — R627 Adult failure to thrive: Secondary | ICD-10-CM | POA: Diagnosis not present

## 2020-01-03 DIAGNOSIS — R627 Adult failure to thrive: Secondary | ICD-10-CM | POA: Diagnosis not present

## 2020-01-03 DIAGNOSIS — R630 Anorexia: Secondary | ICD-10-CM | POA: Diagnosis not present

## 2020-01-03 DIAGNOSIS — D649 Anemia, unspecified: Secondary | ICD-10-CM | POA: Diagnosis not present

## 2020-01-03 DIAGNOSIS — E538 Deficiency of other specified B group vitamins: Secondary | ICD-10-CM | POA: Diagnosis not present

## 2020-01-03 DIAGNOSIS — T8459XA Infection and inflammatory reaction due to other internal joint prosthesis, initial encounter: Secondary | ICD-10-CM | POA: Diagnosis not present

## 2020-01-03 DIAGNOSIS — F329 Major depressive disorder, single episode, unspecified: Secondary | ICD-10-CM | POA: Diagnosis not present

## 2020-01-11 DIAGNOSIS — R627 Adult failure to thrive: Secondary | ICD-10-CM | POA: Diagnosis not present

## 2020-01-11 DIAGNOSIS — D649 Anemia, unspecified: Secondary | ICD-10-CM | POA: Diagnosis not present

## 2020-01-11 DIAGNOSIS — F329 Major depressive disorder, single episode, unspecified: Secondary | ICD-10-CM | POA: Diagnosis not present

## 2020-01-11 DIAGNOSIS — E538 Deficiency of other specified B group vitamins: Secondary | ICD-10-CM | POA: Diagnosis not present

## 2020-01-11 DIAGNOSIS — T8459XA Infection and inflammatory reaction due to other internal joint prosthesis, initial encounter: Secondary | ICD-10-CM | POA: Diagnosis not present

## 2020-01-11 DIAGNOSIS — R630 Anorexia: Secondary | ICD-10-CM | POA: Diagnosis not present

## 2020-01-14 DIAGNOSIS — G4733 Obstructive sleep apnea (adult) (pediatric): Secondary | ICD-10-CM | POA: Diagnosis not present

## 2020-01-14 DIAGNOSIS — H9190 Unspecified hearing loss, unspecified ear: Secondary | ICD-10-CM | POA: Diagnosis not present

## 2020-01-14 DIAGNOSIS — T8453XD Infection and inflammatory reaction due to internal right knee prosthesis, subsequent encounter: Secondary | ICD-10-CM | POA: Diagnosis not present

## 2020-01-14 DIAGNOSIS — E538 Deficiency of other specified B group vitamins: Secondary | ICD-10-CM | POA: Diagnosis not present

## 2020-01-14 DIAGNOSIS — R630 Anorexia: Secondary | ICD-10-CM | POA: Diagnosis not present

## 2020-01-14 DIAGNOSIS — E785 Hyperlipidemia, unspecified: Secondary | ICD-10-CM | POA: Diagnosis not present

## 2020-01-14 DIAGNOSIS — R627 Adult failure to thrive: Secondary | ICD-10-CM | POA: Diagnosis not present

## 2020-01-14 DIAGNOSIS — Z87440 Personal history of urinary (tract) infections: Secondary | ICD-10-CM | POA: Diagnosis not present

## 2020-01-14 DIAGNOSIS — Z9181 History of falling: Secondary | ICD-10-CM | POA: Diagnosis not present

## 2020-01-14 DIAGNOSIS — T8131XD Disruption of external operation (surgical) wound, not elsewhere classified, subsequent encounter: Secondary | ICD-10-CM | POA: Diagnosis not present

## 2020-01-14 DIAGNOSIS — F329 Major depressive disorder, single episode, unspecified: Secondary | ICD-10-CM | POA: Diagnosis not present

## 2020-01-14 DIAGNOSIS — D649 Anemia, unspecified: Secondary | ICD-10-CM | POA: Diagnosis not present

## 2020-01-14 DIAGNOSIS — I10 Essential (primary) hypertension: Secondary | ICD-10-CM | POA: Diagnosis not present

## 2020-01-18 DIAGNOSIS — D649 Anemia, unspecified: Secondary | ICD-10-CM | POA: Diagnosis not present

## 2020-01-18 DIAGNOSIS — I10 Essential (primary) hypertension: Secondary | ICD-10-CM | POA: Diagnosis not present

## 2020-01-18 DIAGNOSIS — T8131XD Disruption of external operation (surgical) wound, not elsewhere classified, subsequent encounter: Secondary | ICD-10-CM | POA: Diagnosis not present

## 2020-01-18 DIAGNOSIS — F329 Major depressive disorder, single episode, unspecified: Secondary | ICD-10-CM | POA: Diagnosis not present

## 2020-01-18 DIAGNOSIS — E538 Deficiency of other specified B group vitamins: Secondary | ICD-10-CM | POA: Diagnosis not present

## 2020-01-18 DIAGNOSIS — T8453XD Infection and inflammatory reaction due to internal right knee prosthesis, subsequent encounter: Secondary | ICD-10-CM | POA: Diagnosis not present

## 2020-01-19 DIAGNOSIS — Z0131 Encounter for examination of blood pressure with abnormal findings: Secondary | ICD-10-CM | POA: Diagnosis not present

## 2020-01-19 DIAGNOSIS — Z20822 Contact with and (suspected) exposure to covid-19: Secondary | ICD-10-CM | POA: Diagnosis not present

## 2020-01-23 DIAGNOSIS — T8453XA Infection and inflammatory reaction due to internal right knee prosthesis, initial encounter: Secondary | ICD-10-CM | POA: Diagnosis not present

## 2020-01-24 DIAGNOSIS — Z20822 Contact with and (suspected) exposure to covid-19: Secondary | ICD-10-CM | POA: Diagnosis not present

## 2020-01-24 DIAGNOSIS — Z01812 Encounter for preprocedural laboratory examination: Secondary | ICD-10-CM | POA: Diagnosis not present

## 2020-01-25 DIAGNOSIS — T8131XD Disruption of external operation (surgical) wound, not elsewhere classified, subsequent encounter: Secondary | ICD-10-CM | POA: Diagnosis not present

## 2020-01-25 DIAGNOSIS — I10 Essential (primary) hypertension: Secondary | ICD-10-CM | POA: Diagnosis not present

## 2020-01-25 DIAGNOSIS — T8453XD Infection and inflammatory reaction due to internal right knee prosthesis, subsequent encounter: Secondary | ICD-10-CM | POA: Diagnosis not present

## 2020-01-25 DIAGNOSIS — F329 Major depressive disorder, single episode, unspecified: Secondary | ICD-10-CM | POA: Diagnosis not present

## 2020-01-25 DIAGNOSIS — D649 Anemia, unspecified: Secondary | ICD-10-CM | POA: Diagnosis not present

## 2020-01-25 DIAGNOSIS — E538 Deficiency of other specified B group vitamins: Secondary | ICD-10-CM | POA: Diagnosis not present

## 2020-01-31 DIAGNOSIS — M25561 Pain in right knee: Secondary | ICD-10-CM | POA: Diagnosis not present

## 2020-01-31 DIAGNOSIS — T8189XA Other complications of procedures, not elsewhere classified, initial encounter: Secondary | ICD-10-CM | POA: Diagnosis not present

## 2020-01-31 DIAGNOSIS — F329 Major depressive disorder, single episode, unspecified: Secondary | ICD-10-CM | POA: Diagnosis not present

## 2020-01-31 DIAGNOSIS — T8453XA Infection and inflammatory reaction due to internal right knee prosthesis, initial encounter: Secondary | ICD-10-CM | POA: Diagnosis not present

## 2020-01-31 DIAGNOSIS — Z79899 Other long term (current) drug therapy: Secondary | ICD-10-CM | POA: Diagnosis not present

## 2020-01-31 DIAGNOSIS — Z8616 Personal history of COVID-19: Secondary | ICD-10-CM | POA: Diagnosis not present

## 2020-01-31 DIAGNOSIS — S81001S Unspecified open wound, right knee, sequela: Secondary | ICD-10-CM | POA: Diagnosis not present

## 2020-01-31 DIAGNOSIS — Z96651 Presence of right artificial knee joint: Secondary | ICD-10-CM | POA: Diagnosis not present

## 2020-01-31 DIAGNOSIS — I10 Essential (primary) hypertension: Secondary | ICD-10-CM | POA: Diagnosis not present

## 2020-01-31 DIAGNOSIS — M1711 Unilateral primary osteoarthritis, right knee: Secondary | ICD-10-CM | POA: Diagnosis not present

## 2020-02-01 DIAGNOSIS — E538 Deficiency of other specified B group vitamins: Secondary | ICD-10-CM | POA: Diagnosis not present

## 2020-02-01 DIAGNOSIS — T8453XD Infection and inflammatory reaction due to internal right knee prosthesis, subsequent encounter: Secondary | ICD-10-CM | POA: Diagnosis not present

## 2020-02-01 DIAGNOSIS — T8131XD Disruption of external operation (surgical) wound, not elsewhere classified, subsequent encounter: Secondary | ICD-10-CM | POA: Diagnosis not present

## 2020-02-01 DIAGNOSIS — F329 Major depressive disorder, single episode, unspecified: Secondary | ICD-10-CM | POA: Diagnosis not present

## 2020-02-01 DIAGNOSIS — D649 Anemia, unspecified: Secondary | ICD-10-CM | POA: Diagnosis not present

## 2020-02-01 DIAGNOSIS — I10 Essential (primary) hypertension: Secondary | ICD-10-CM | POA: Diagnosis not present

## 2020-02-06 DIAGNOSIS — I10 Essential (primary) hypertension: Secondary | ICD-10-CM | POA: Diagnosis not present

## 2020-02-06 DIAGNOSIS — T8453XD Infection and inflammatory reaction due to internal right knee prosthesis, subsequent encounter: Secondary | ICD-10-CM | POA: Diagnosis not present

## 2020-02-06 DIAGNOSIS — F329 Major depressive disorder, single episode, unspecified: Secondary | ICD-10-CM | POA: Diagnosis not present

## 2020-02-06 DIAGNOSIS — T8131XD Disruption of external operation (surgical) wound, not elsewhere classified, subsequent encounter: Secondary | ICD-10-CM | POA: Diagnosis not present

## 2020-02-06 DIAGNOSIS — E538 Deficiency of other specified B group vitamins: Secondary | ICD-10-CM | POA: Diagnosis not present

## 2020-02-06 DIAGNOSIS — D649 Anemia, unspecified: Secondary | ICD-10-CM | POA: Diagnosis not present

## 2020-02-13 DIAGNOSIS — T8453XD Infection and inflammatory reaction due to internal right knee prosthesis, subsequent encounter: Secondary | ICD-10-CM | POA: Diagnosis not present

## 2020-02-13 DIAGNOSIS — Z87440 Personal history of urinary (tract) infections: Secondary | ICD-10-CM | POA: Diagnosis not present

## 2020-02-13 DIAGNOSIS — H9190 Unspecified hearing loss, unspecified ear: Secondary | ICD-10-CM | POA: Diagnosis not present

## 2020-02-13 DIAGNOSIS — E538 Deficiency of other specified B group vitamins: Secondary | ICD-10-CM | POA: Diagnosis not present

## 2020-02-13 DIAGNOSIS — R627 Adult failure to thrive: Secondary | ICD-10-CM | POA: Diagnosis not present

## 2020-02-13 DIAGNOSIS — G4733 Obstructive sleep apnea (adult) (pediatric): Secondary | ICD-10-CM | POA: Diagnosis not present

## 2020-02-13 DIAGNOSIS — I10 Essential (primary) hypertension: Secondary | ICD-10-CM | POA: Diagnosis not present

## 2020-02-13 DIAGNOSIS — Z9181 History of falling: Secondary | ICD-10-CM | POA: Diagnosis not present

## 2020-02-13 DIAGNOSIS — T8131XD Disruption of external operation (surgical) wound, not elsewhere classified, subsequent encounter: Secondary | ICD-10-CM | POA: Diagnosis not present

## 2020-02-13 DIAGNOSIS — F329 Major depressive disorder, single episode, unspecified: Secondary | ICD-10-CM | POA: Diagnosis not present

## 2020-02-13 DIAGNOSIS — E785 Hyperlipidemia, unspecified: Secondary | ICD-10-CM | POA: Diagnosis not present

## 2020-02-13 DIAGNOSIS — D649 Anemia, unspecified: Secondary | ICD-10-CM | POA: Diagnosis not present

## 2020-02-13 DIAGNOSIS — R630 Anorexia: Secondary | ICD-10-CM | POA: Diagnosis not present

## 2020-02-14 DIAGNOSIS — F329 Major depressive disorder, single episode, unspecified: Secondary | ICD-10-CM | POA: Diagnosis not present

## 2020-02-14 DIAGNOSIS — I10 Essential (primary) hypertension: Secondary | ICD-10-CM | POA: Diagnosis not present

## 2020-02-14 DIAGNOSIS — T8131XD Disruption of external operation (surgical) wound, not elsewhere classified, subsequent encounter: Secondary | ICD-10-CM | POA: Diagnosis not present

## 2020-02-14 DIAGNOSIS — D649 Anemia, unspecified: Secondary | ICD-10-CM | POA: Diagnosis not present

## 2020-02-14 DIAGNOSIS — T8453XD Infection and inflammatory reaction due to internal right knee prosthesis, subsequent encounter: Secondary | ICD-10-CM | POA: Diagnosis not present

## 2020-02-14 DIAGNOSIS — E538 Deficiency of other specified B group vitamins: Secondary | ICD-10-CM | POA: Diagnosis not present

## 2020-02-22 DIAGNOSIS — T8131XD Disruption of external operation (surgical) wound, not elsewhere classified, subsequent encounter: Secondary | ICD-10-CM | POA: Diagnosis not present

## 2020-02-22 DIAGNOSIS — E538 Deficiency of other specified B group vitamins: Secondary | ICD-10-CM | POA: Diagnosis not present

## 2020-02-22 DIAGNOSIS — F329 Major depressive disorder, single episode, unspecified: Secondary | ICD-10-CM | POA: Diagnosis not present

## 2020-02-22 DIAGNOSIS — D649 Anemia, unspecified: Secondary | ICD-10-CM | POA: Diagnosis not present

## 2020-02-22 DIAGNOSIS — T8453XD Infection and inflammatory reaction due to internal right knee prosthesis, subsequent encounter: Secondary | ICD-10-CM | POA: Diagnosis not present

## 2020-02-22 DIAGNOSIS — I10 Essential (primary) hypertension: Secondary | ICD-10-CM | POA: Diagnosis not present

## 2020-02-29 DIAGNOSIS — F329 Major depressive disorder, single episode, unspecified: Secondary | ICD-10-CM | POA: Diagnosis not present

## 2020-02-29 DIAGNOSIS — T8453XD Infection and inflammatory reaction due to internal right knee prosthesis, subsequent encounter: Secondary | ICD-10-CM | POA: Diagnosis not present

## 2020-02-29 DIAGNOSIS — I10 Essential (primary) hypertension: Secondary | ICD-10-CM | POA: Diagnosis not present

## 2020-02-29 DIAGNOSIS — T8131XD Disruption of external operation (surgical) wound, not elsewhere classified, subsequent encounter: Secondary | ICD-10-CM | POA: Diagnosis not present

## 2020-02-29 DIAGNOSIS — D649 Anemia, unspecified: Secondary | ICD-10-CM | POA: Diagnosis not present

## 2020-02-29 DIAGNOSIS — E538 Deficiency of other specified B group vitamins: Secondary | ICD-10-CM | POA: Diagnosis not present

## 2020-03-08 DIAGNOSIS — T8453XD Infection and inflammatory reaction due to internal right knee prosthesis, subsequent encounter: Secondary | ICD-10-CM | POA: Diagnosis not present

## 2020-03-08 DIAGNOSIS — I10 Essential (primary) hypertension: Secondary | ICD-10-CM | POA: Diagnosis not present

## 2020-03-08 DIAGNOSIS — T8131XD Disruption of external operation (surgical) wound, not elsewhere classified, subsequent encounter: Secondary | ICD-10-CM | POA: Diagnosis not present

## 2020-03-08 DIAGNOSIS — E538 Deficiency of other specified B group vitamins: Secondary | ICD-10-CM | POA: Diagnosis not present

## 2020-03-08 DIAGNOSIS — D649 Anemia, unspecified: Secondary | ICD-10-CM | POA: Diagnosis not present

## 2020-03-08 DIAGNOSIS — F329 Major depressive disorder, single episode, unspecified: Secondary | ICD-10-CM | POA: Diagnosis not present

## 2020-03-14 DIAGNOSIS — T8453XD Infection and inflammatory reaction due to internal right knee prosthesis, subsequent encounter: Secondary | ICD-10-CM | POA: Diagnosis not present

## 2020-03-14 DIAGNOSIS — Z96651 Presence of right artificial knee joint: Secondary | ICD-10-CM | POA: Diagnosis not present

## 2020-03-28 DIAGNOSIS — M25561 Pain in right knee: Secondary | ICD-10-CM | POA: Diagnosis not present

## 2020-03-28 DIAGNOSIS — I10 Essential (primary) hypertension: Secondary | ICD-10-CM | POA: Diagnosis not present

## 2020-03-28 DIAGNOSIS — N3091 Cystitis, unspecified with hematuria: Secondary | ICD-10-CM | POA: Diagnosis not present

## 2020-05-03 DIAGNOSIS — J42 Unspecified chronic bronchitis: Secondary | ICD-10-CM | POA: Diagnosis not present

## 2020-05-03 DIAGNOSIS — J449 Chronic obstructive pulmonary disease, unspecified: Secondary | ICD-10-CM | POA: Diagnosis not present

## 2020-05-10 DIAGNOSIS — E538 Deficiency of other specified B group vitamins: Secondary | ICD-10-CM | POA: Diagnosis not present

## 2020-05-10 DIAGNOSIS — Z20822 Contact with and (suspected) exposure to covid-19: Secondary | ICD-10-CM | POA: Diagnosis not present

## 2020-05-10 DIAGNOSIS — M1711 Unilateral primary osteoarthritis, right knee: Secondary | ICD-10-CM | POA: Diagnosis not present

## 2020-05-10 DIAGNOSIS — E559 Vitamin D deficiency, unspecified: Secondary | ICD-10-CM | POA: Diagnosis not present

## 2020-05-10 DIAGNOSIS — Z01812 Encounter for preprocedural laboratory examination: Secondary | ICD-10-CM | POA: Diagnosis not present

## 2020-05-10 DIAGNOSIS — Z8619 Personal history of other infectious and parasitic diseases: Secondary | ICD-10-CM | POA: Diagnosis not present

## 2020-05-10 DIAGNOSIS — Z9889 Other specified postprocedural states: Secondary | ICD-10-CM | POA: Diagnosis not present

## 2020-05-10 DIAGNOSIS — K219 Gastro-esophageal reflux disease without esophagitis: Secondary | ICD-10-CM | POA: Diagnosis not present

## 2020-05-10 DIAGNOSIS — T8450XS Infection and inflammatory reaction due to unspecified internal joint prosthesis, sequela: Secondary | ICD-10-CM | POA: Diagnosis not present

## 2020-05-10 DIAGNOSIS — T8131XD Disruption of external operation (surgical) wound, not elsewhere classified, subsequent encounter: Secondary | ICD-10-CM | POA: Diagnosis not present

## 2020-05-10 DIAGNOSIS — Z471 Aftercare following joint replacement surgery: Secondary | ICD-10-CM | POA: Diagnosis not present

## 2020-05-10 DIAGNOSIS — Z01810 Encounter for preprocedural cardiovascular examination: Secondary | ICD-10-CM | POA: Diagnosis not present

## 2020-05-10 DIAGNOSIS — I10 Essential (primary) hypertension: Secondary | ICD-10-CM | POA: Diagnosis not present

## 2020-05-10 DIAGNOSIS — N1831 Chronic kidney disease, stage 3a: Secondary | ICD-10-CM | POA: Diagnosis not present

## 2020-05-10 DIAGNOSIS — Z96651 Presence of right artificial knee joint: Secondary | ICD-10-CM | POA: Diagnosis not present

## 2020-05-14 DIAGNOSIS — H6122 Impacted cerumen, left ear: Secondary | ICD-10-CM | POA: Diagnosis not present

## 2020-05-15 DIAGNOSIS — G8918 Other acute postprocedural pain: Secondary | ICD-10-CM | POA: Diagnosis not present

## 2020-05-15 DIAGNOSIS — Z8616 Personal history of COVID-19: Secondary | ICD-10-CM | POA: Diagnosis not present

## 2020-05-15 DIAGNOSIS — I129 Hypertensive chronic kidney disease with stage 1 through stage 4 chronic kidney disease, or unspecified chronic kidney disease: Secondary | ICD-10-CM | POA: Diagnosis present

## 2020-05-15 DIAGNOSIS — H9190 Unspecified hearing loss, unspecified ear: Secondary | ICD-10-CM | POA: Diagnosis present

## 2020-05-15 DIAGNOSIS — I10 Essential (primary) hypertension: Secondary | ICD-10-CM | POA: Diagnosis not present

## 2020-05-15 DIAGNOSIS — T84092A Other mechanical complication of internal right knee prosthesis, initial encounter: Secondary | ICD-10-CM | POA: Diagnosis not present

## 2020-05-15 DIAGNOSIS — Z471 Aftercare following joint replacement surgery: Secondary | ICD-10-CM | POA: Diagnosis not present

## 2020-05-15 DIAGNOSIS — D62 Acute posthemorrhagic anemia: Secondary | ICD-10-CM | POA: Diagnosis not present

## 2020-05-15 DIAGNOSIS — N182 Chronic kidney disease, stage 2 (mild): Secondary | ICD-10-CM | POA: Diagnosis present

## 2020-05-15 DIAGNOSIS — E871 Hypo-osmolality and hyponatremia: Secondary | ICD-10-CM | POA: Diagnosis present

## 2020-05-15 DIAGNOSIS — K5900 Constipation, unspecified: Secondary | ICD-10-CM | POA: Diagnosis not present

## 2020-05-15 DIAGNOSIS — T84093A Other mechanical complication of internal left knee prosthesis, initial encounter: Secondary | ICD-10-CM | POA: Diagnosis not present

## 2020-05-15 DIAGNOSIS — M67861 Other specified disorders of synovium, right knee: Secondary | ICD-10-CM | POA: Diagnosis not present

## 2020-05-15 DIAGNOSIS — M25561 Pain in right knee: Secondary | ICD-10-CM | POA: Diagnosis not present

## 2020-05-15 DIAGNOSIS — R2689 Other abnormalities of gait and mobility: Secondary | ICD-10-CM | POA: Diagnosis not present

## 2020-05-15 DIAGNOSIS — Z96651 Presence of right artificial knee joint: Secondary | ICD-10-CM | POA: Diagnosis not present

## 2020-05-15 DIAGNOSIS — T8453XA Infection and inflammatory reaction due to internal right knee prosthesis, initial encounter: Secondary | ICD-10-CM | POA: Diagnosis not present

## 2020-05-15 DIAGNOSIS — Z4733 Aftercare following explantation of knee joint prosthesis: Secondary | ICD-10-CM | POA: Diagnosis not present

## 2020-05-15 DIAGNOSIS — Z981 Arthrodesis status: Secondary | ICD-10-CM | POA: Diagnosis not present

## 2020-05-15 DIAGNOSIS — M6281 Muscle weakness (generalized): Secondary | ICD-10-CM | POA: Diagnosis not present

## 2020-05-15 DIAGNOSIS — R41841 Cognitive communication deficit: Secondary | ICD-10-CM | POA: Diagnosis not present

## 2020-05-15 DIAGNOSIS — T8450XS Infection and inflammatory reaction due to unspecified internal joint prosthesis, sequela: Secondary | ICD-10-CM | POA: Diagnosis not present

## 2020-05-19 DIAGNOSIS — M25561 Pain in right knee: Secondary | ICD-10-CM | POA: Diagnosis not present

## 2020-05-19 DIAGNOSIS — K5900 Constipation, unspecified: Secondary | ICD-10-CM | POA: Diagnosis not present

## 2020-05-19 DIAGNOSIS — R2689 Other abnormalities of gait and mobility: Secondary | ICD-10-CM | POA: Diagnosis not present

## 2020-05-19 DIAGNOSIS — Z5189 Encounter for other specified aftercare: Secondary | ICD-10-CM | POA: Diagnosis not present

## 2020-05-19 DIAGNOSIS — I10 Essential (primary) hypertension: Secondary | ICD-10-CM | POA: Diagnosis not present

## 2020-05-19 DIAGNOSIS — D62 Acute posthemorrhagic anemia: Secondary | ICD-10-CM | POA: Diagnosis not present

## 2020-05-19 DIAGNOSIS — E871 Hypo-osmolality and hyponatremia: Secondary | ICD-10-CM | POA: Diagnosis not present

## 2020-05-19 DIAGNOSIS — Z981 Arthrodesis status: Secondary | ICD-10-CM | POA: Diagnosis not present

## 2020-05-19 DIAGNOSIS — R41841 Cognitive communication deficit: Secondary | ICD-10-CM | POA: Diagnosis not present

## 2020-05-19 DIAGNOSIS — N182 Chronic kidney disease, stage 2 (mild): Secondary | ICD-10-CM | POA: Diagnosis not present

## 2020-05-19 DIAGNOSIS — M6281 Muscle weakness (generalized): Secondary | ICD-10-CM | POA: Diagnosis not present

## 2020-05-19 DIAGNOSIS — Z471 Aftercare following joint replacement surgery: Secondary | ICD-10-CM | POA: Diagnosis not present

## 2020-05-21 DIAGNOSIS — M25561 Pain in right knee: Secondary | ICD-10-CM | POA: Diagnosis not present

## 2020-05-21 DIAGNOSIS — E871 Hypo-osmolality and hyponatremia: Secondary | ICD-10-CM | POA: Diagnosis not present

## 2020-05-21 DIAGNOSIS — N182 Chronic kidney disease, stage 2 (mild): Secondary | ICD-10-CM | POA: Diagnosis not present

## 2020-05-21 DIAGNOSIS — I10 Essential (primary) hypertension: Secondary | ICD-10-CM | POA: Diagnosis not present

## 2020-05-28 ENCOUNTER — Other Ambulatory Visit: Payer: Self-pay | Admitting: *Deleted

## 2020-05-28 DIAGNOSIS — Z5189 Encounter for other specified aftercare: Secondary | ICD-10-CM | POA: Diagnosis not present

## 2020-05-28 NOTE — Patient Outreach (Signed)
Member screened for potential [facility] Care Management needs as a benefit of [employer].  Per Patient Ping member resides in [facility].   Communication sent to [facility] SW to collaborate about anticipated dc plans and potential [facility] Care Management needs.  Will continue to follow while member resides in SNF.    Finbar Nippert, MSN, RN,BSN [facility] Post Acute Care Coordinator [phone] ( Business Mobile) [phone]  (Toll free office) 

## 2020-06-09 DIAGNOSIS — E539 Vitamin B deficiency, unspecified: Secondary | ICD-10-CM | POA: Diagnosis not present

## 2020-06-09 DIAGNOSIS — K219 Gastro-esophageal reflux disease without esophagitis: Secondary | ICD-10-CM | POA: Diagnosis not present

## 2020-06-09 DIAGNOSIS — T8453XD Infection and inflammatory reaction due to internal right knee prosthesis, subsequent encounter: Secondary | ICD-10-CM | POA: Diagnosis not present

## 2020-06-09 DIAGNOSIS — I129 Hypertensive chronic kidney disease with stage 1 through stage 4 chronic kidney disease, or unspecified chronic kidney disease: Secondary | ICD-10-CM | POA: Diagnosis not present

## 2020-06-09 DIAGNOSIS — R2689 Other abnormalities of gait and mobility: Secondary | ICD-10-CM | POA: Diagnosis not present

## 2020-06-09 DIAGNOSIS — D62 Acute posthemorrhagic anemia: Secondary | ICD-10-CM | POA: Diagnosis not present

## 2020-06-09 DIAGNOSIS — N182 Chronic kidney disease, stage 2 (mild): Secondary | ICD-10-CM | POA: Diagnosis not present

## 2020-06-09 DIAGNOSIS — E871 Hypo-osmolality and hyponatremia: Secondary | ICD-10-CM | POA: Diagnosis not present

## 2020-06-09 DIAGNOSIS — K5900 Constipation, unspecified: Secondary | ICD-10-CM | POA: Diagnosis not present

## 2020-06-09 DIAGNOSIS — Z981 Arthrodesis status: Secondary | ICD-10-CM | POA: Diagnosis not present

## 2020-06-09 DIAGNOSIS — Z471 Aftercare following joint replacement surgery: Secondary | ICD-10-CM | POA: Diagnosis not present

## 2020-06-09 DIAGNOSIS — R41841 Cognitive communication deficit: Secondary | ICD-10-CM | POA: Diagnosis not present

## 2020-06-09 DIAGNOSIS — E559 Vitamin D deficiency, unspecified: Secondary | ICD-10-CM | POA: Diagnosis not present

## 2020-06-14 DIAGNOSIS — T8453XD Infection and inflammatory reaction due to internal right knee prosthesis, subsequent encounter: Secondary | ICD-10-CM | POA: Diagnosis not present

## 2020-06-14 DIAGNOSIS — K5900 Constipation, unspecified: Secondary | ICD-10-CM | POA: Diagnosis not present

## 2020-06-14 DIAGNOSIS — E871 Hypo-osmolality and hyponatremia: Secondary | ICD-10-CM | POA: Diagnosis not present

## 2020-06-14 DIAGNOSIS — N182 Chronic kidney disease, stage 2 (mild): Secondary | ICD-10-CM | POA: Diagnosis not present

## 2020-06-14 DIAGNOSIS — I129 Hypertensive chronic kidney disease with stage 1 through stage 4 chronic kidney disease, or unspecified chronic kidney disease: Secondary | ICD-10-CM | POA: Diagnosis not present

## 2020-06-14 DIAGNOSIS — D62 Acute posthemorrhagic anemia: Secondary | ICD-10-CM | POA: Diagnosis not present

## 2020-06-18 DIAGNOSIS — N182 Chronic kidney disease, stage 2 (mild): Secondary | ICD-10-CM | POA: Diagnosis not present

## 2020-06-18 DIAGNOSIS — E871 Hypo-osmolality and hyponatremia: Secondary | ICD-10-CM | POA: Diagnosis not present

## 2020-06-18 DIAGNOSIS — D62 Acute posthemorrhagic anemia: Secondary | ICD-10-CM | POA: Diagnosis not present

## 2020-06-18 DIAGNOSIS — K5900 Constipation, unspecified: Secondary | ICD-10-CM | POA: Diagnosis not present

## 2020-06-18 DIAGNOSIS — I129 Hypertensive chronic kidney disease with stage 1 through stage 4 chronic kidney disease, or unspecified chronic kidney disease: Secondary | ICD-10-CM | POA: Diagnosis not present

## 2020-06-18 DIAGNOSIS — T8453XD Infection and inflammatory reaction due to internal right knee prosthesis, subsequent encounter: Secondary | ICD-10-CM | POA: Diagnosis not present

## 2020-06-26 DIAGNOSIS — K5900 Constipation, unspecified: Secondary | ICD-10-CM | POA: Diagnosis not present

## 2020-06-26 DIAGNOSIS — E871 Hypo-osmolality and hyponatremia: Secondary | ICD-10-CM | POA: Diagnosis not present

## 2020-06-26 DIAGNOSIS — T8453XD Infection and inflammatory reaction due to internal right knee prosthesis, subsequent encounter: Secondary | ICD-10-CM | POA: Diagnosis not present

## 2020-06-26 DIAGNOSIS — I129 Hypertensive chronic kidney disease with stage 1 through stage 4 chronic kidney disease, or unspecified chronic kidney disease: Secondary | ICD-10-CM | POA: Diagnosis not present

## 2020-06-26 DIAGNOSIS — D62 Acute posthemorrhagic anemia: Secondary | ICD-10-CM | POA: Diagnosis not present

## 2020-06-26 DIAGNOSIS — N182 Chronic kidney disease, stage 2 (mild): Secondary | ICD-10-CM | POA: Diagnosis not present

## 2020-06-28 DIAGNOSIS — D62 Acute posthemorrhagic anemia: Secondary | ICD-10-CM | POA: Diagnosis not present

## 2020-06-28 DIAGNOSIS — N182 Chronic kidney disease, stage 2 (mild): Secondary | ICD-10-CM | POA: Diagnosis not present

## 2020-06-28 DIAGNOSIS — I129 Hypertensive chronic kidney disease with stage 1 through stage 4 chronic kidney disease, or unspecified chronic kidney disease: Secondary | ICD-10-CM | POA: Diagnosis not present

## 2020-06-28 DIAGNOSIS — T8453XD Infection and inflammatory reaction due to internal right knee prosthesis, subsequent encounter: Secondary | ICD-10-CM | POA: Diagnosis not present

## 2020-06-28 DIAGNOSIS — K5900 Constipation, unspecified: Secondary | ICD-10-CM | POA: Diagnosis not present

## 2020-06-28 DIAGNOSIS — E871 Hypo-osmolality and hyponatremia: Secondary | ICD-10-CM | POA: Diagnosis not present

## 2020-07-04 DIAGNOSIS — E871 Hypo-osmolality and hyponatremia: Secondary | ICD-10-CM | POA: Diagnosis not present

## 2020-07-04 DIAGNOSIS — K5900 Constipation, unspecified: Secondary | ICD-10-CM | POA: Diagnosis not present

## 2020-07-04 DIAGNOSIS — T8453XD Infection and inflammatory reaction due to internal right knee prosthesis, subsequent encounter: Secondary | ICD-10-CM | POA: Diagnosis not present

## 2020-07-04 DIAGNOSIS — N182 Chronic kidney disease, stage 2 (mild): Secondary | ICD-10-CM | POA: Diagnosis not present

## 2020-07-04 DIAGNOSIS — D62 Acute posthemorrhagic anemia: Secondary | ICD-10-CM | POA: Diagnosis not present

## 2020-07-04 DIAGNOSIS — I129 Hypertensive chronic kidney disease with stage 1 through stage 4 chronic kidney disease, or unspecified chronic kidney disease: Secondary | ICD-10-CM | POA: Diagnosis not present

## 2020-07-09 DIAGNOSIS — Z981 Arthrodesis status: Secondary | ICD-10-CM | POA: Diagnosis not present

## 2020-07-09 DIAGNOSIS — I129 Hypertensive chronic kidney disease with stage 1 through stage 4 chronic kidney disease, or unspecified chronic kidney disease: Secondary | ICD-10-CM | POA: Diagnosis not present

## 2020-07-09 DIAGNOSIS — K5900 Constipation, unspecified: Secondary | ICD-10-CM | POA: Diagnosis not present

## 2020-07-09 DIAGNOSIS — E871 Hypo-osmolality and hyponatremia: Secondary | ICD-10-CM | POA: Diagnosis not present

## 2020-07-09 DIAGNOSIS — R41841 Cognitive communication deficit: Secondary | ICD-10-CM | POA: Diagnosis not present

## 2020-07-09 DIAGNOSIS — T8453XD Infection and inflammatory reaction due to internal right knee prosthesis, subsequent encounter: Secondary | ICD-10-CM | POA: Diagnosis not present

## 2020-07-09 DIAGNOSIS — E539 Vitamin B deficiency, unspecified: Secondary | ICD-10-CM | POA: Diagnosis not present

## 2020-07-09 DIAGNOSIS — D62 Acute posthemorrhagic anemia: Secondary | ICD-10-CM | POA: Diagnosis not present

## 2020-07-09 DIAGNOSIS — R2689 Other abnormalities of gait and mobility: Secondary | ICD-10-CM | POA: Diagnosis not present

## 2020-07-09 DIAGNOSIS — N182 Chronic kidney disease, stage 2 (mild): Secondary | ICD-10-CM | POA: Diagnosis not present

## 2020-07-09 DIAGNOSIS — K219 Gastro-esophageal reflux disease without esophagitis: Secondary | ICD-10-CM | POA: Diagnosis not present

## 2020-07-09 DIAGNOSIS — E559 Vitamin D deficiency, unspecified: Secondary | ICD-10-CM | POA: Diagnosis not present

## 2020-07-12 DIAGNOSIS — T8459XA Infection and inflammatory reaction due to other internal joint prosthesis, initial encounter: Secondary | ICD-10-CM | POA: Diagnosis not present

## 2020-07-13 DIAGNOSIS — T8453XD Infection and inflammatory reaction due to internal right knee prosthesis, subsequent encounter: Secondary | ICD-10-CM | POA: Diagnosis not present

## 2020-07-13 DIAGNOSIS — N182 Chronic kidney disease, stage 2 (mild): Secondary | ICD-10-CM | POA: Diagnosis not present

## 2020-07-13 DIAGNOSIS — K5900 Constipation, unspecified: Secondary | ICD-10-CM | POA: Diagnosis not present

## 2020-07-13 DIAGNOSIS — E871 Hypo-osmolality and hyponatremia: Secondary | ICD-10-CM | POA: Diagnosis not present

## 2020-07-13 DIAGNOSIS — I129 Hypertensive chronic kidney disease with stage 1 through stage 4 chronic kidney disease, or unspecified chronic kidney disease: Secondary | ICD-10-CM | POA: Diagnosis not present

## 2020-07-13 DIAGNOSIS — D62 Acute posthemorrhagic anemia: Secondary | ICD-10-CM | POA: Diagnosis not present

## 2020-07-16 ENCOUNTER — Emergency Department (HOSPITAL_COMMUNITY): Payer: Medicare Other

## 2020-07-16 ENCOUNTER — Encounter (HOSPITAL_COMMUNITY): Payer: Self-pay | Admitting: *Deleted

## 2020-07-16 ENCOUNTER — Emergency Department (HOSPITAL_COMMUNITY)
Admission: EM | Admit: 2020-07-16 | Discharge: 2020-07-16 | Disposition: A | Discharge status: Home / Self Care | Payer: Medicare Other | Attending: Emergency Medicine | Admitting: Emergency Medicine

## 2020-07-16 ENCOUNTER — Other Ambulatory Visit: Payer: Self-pay

## 2020-07-16 DIAGNOSIS — R079 Chest pain, unspecified: Secondary | ICD-10-CM | POA: Diagnosis not present

## 2020-07-16 DIAGNOSIS — J9811 Atelectasis: Secondary | ICD-10-CM | POA: Diagnosis not present

## 2020-07-16 DIAGNOSIS — R1013 Epigastric pain: Secondary | ICD-10-CM | POA: Insufficient documentation

## 2020-07-16 DIAGNOSIS — Z79899 Other long term (current) drug therapy: Secondary | ICD-10-CM | POA: Diagnosis not present

## 2020-07-16 DIAGNOSIS — K449 Diaphragmatic hernia without obstruction or gangrene: Secondary | ICD-10-CM | POA: Diagnosis not present

## 2020-07-16 DIAGNOSIS — R0602 Shortness of breath: Secondary | ICD-10-CM | POA: Diagnosis not present

## 2020-07-16 DIAGNOSIS — Z20822 Contact with and (suspected) exposure to covid-19: Secondary | ICD-10-CM | POA: Insufficient documentation

## 2020-07-16 DIAGNOSIS — M79604 Pain in right leg: Secondary | ICD-10-CM | POA: Diagnosis not present

## 2020-07-16 DIAGNOSIS — I10 Essential (primary) hypertension: Secondary | ICD-10-CM | POA: Insufficient documentation

## 2020-07-16 DIAGNOSIS — Z96651 Presence of right artificial knee joint: Secondary | ICD-10-CM | POA: Insufficient documentation

## 2020-07-16 DIAGNOSIS — R109 Unspecified abdominal pain: Secondary | ICD-10-CM | POA: Diagnosis not present

## 2020-07-16 DIAGNOSIS — M79661 Pain in right lower leg: Secondary | ICD-10-CM | POA: Diagnosis not present

## 2020-07-16 LAB — URINALYSIS, ROUTINE W REFLEX MICROSCOPIC
Bilirubin Urine: NEGATIVE
Glucose, UA: NEGATIVE mg/dL
Hgb urine dipstick: NEGATIVE
Ketones, ur: NEGATIVE mg/dL
Leukocytes,Ua: NEGATIVE
Nitrite: NEGATIVE
Protein, ur: 30 mg/dL — AB
Specific Gravity, Urine: 1.033 — ABNORMAL HIGH (ref 1.005–1.030)
pH: 7 (ref 5.0–8.0)

## 2020-07-16 LAB — COMPREHENSIVE METABOLIC PANEL
ALT: 17 U/L (ref 0–44)
AST: 17 U/L (ref 15–41)
Albumin: 4.1 g/dL (ref 3.5–5.0)
Alkaline Phosphatase: 114 U/L (ref 38–126)
Anion gap: 8 (ref 5–15)
BUN: 15 mg/dL (ref 8–23)
CO2: 26 mmol/L (ref 22–32)
Calcium: 9.7 mg/dL (ref 8.9–10.3)
Chloride: 97 mmol/L — ABNORMAL LOW (ref 98–111)
Creatinine, Ser: 1.13 mg/dL (ref 0.61–1.24)
GFR, Estimated: >60 mL/min (ref >60)
Glucose, Bld: 124 mg/dL — ABNORMAL HIGH (ref 70–99)
Potassium: 3.8 mmol/L (ref 3.5–5.1)
Sodium: 131 mmol/L — ABNORMAL LOW (ref 135–145)
Total Bilirubin: 1.5 mg/dL — ABNORMAL HIGH (ref 0.3–1.2)
Total Protein: 8.2 g/dL — ABNORMAL HIGH (ref 6.5–8.1)

## 2020-07-16 LAB — CBC WITH DIFFERENTIAL/PLATELET
Abs Immature Granulocytes: 0.05 10*3/uL (ref 0.00–0.07)
Basophils Absolute: 0 10*3/uL (ref 0.0–0.1)
Basophils Relative: 0 %
Eosinophils Absolute: 0.1 10*3/uL (ref 0.0–0.5)
Eosinophils Relative: 1 %
HCT: 43.3 % (ref 39.0–52.0)
Hemoglobin: 13.6 g/dL (ref 13.0–17.0)
Immature Granulocytes: 1 %
Lymphocytes Relative: 11 %
Lymphs Abs: 1.2 10*3/uL (ref 0.7–4.0)
MCH: 27.1 pg (ref 26.0–34.0)
MCHC: 31.4 g/dL (ref 30.0–36.0)
MCV: 86.4 fL (ref 80.0–100.0)
Monocytes Absolute: 0.8 10*3/uL (ref 0.1–1.0)
Monocytes Relative: 8 %
Neutro Abs: 8.9 10*3/uL — ABNORMAL HIGH (ref 1.7–7.7)
Neutrophils Relative %: 79 %
Platelets: 262 10*3/uL (ref 150–400)
RBC: 5.01 MIL/uL (ref 4.22–5.81)
RDW: 14 % (ref 11.5–15.5)
WBC: 11.1 10*3/uL — ABNORMAL HIGH (ref 4.0–10.5)
nRBC: 0 % (ref 0.0–0.2)

## 2020-07-16 LAB — TROPONIN I (HIGH SENSITIVITY)
Troponin I (High Sensitivity): 11 ng/L (ref <18)
Troponin I (High Sensitivity): 12 ng/L (ref <18)

## 2020-07-16 LAB — RESP PANEL BY RT-PCR (FLU A&B, COVID) ARPGX2
Influenza A by PCR: NEGATIVE
Influenza B by PCR: NEGATIVE
SARS Coronavirus 2 by RT PCR: NEGATIVE

## 2020-07-16 LAB — LACTIC ACID, PLASMA: Lactic Acid, Venous: 1.2 mmol/L (ref 0.5–1.9)

## 2020-07-16 MED ORDER — LABETALOL HCL 5 MG/ML IV SOLN
20.0000 mg | Freq: Once | INTRAVENOUS | Status: AC
  Administered 2020-07-16: 20 mg via INTRAVENOUS
  Filled 2020-07-16: qty 4

## 2020-07-16 MED ORDER — IOHEXOL 350 MG/ML SOLN
100.0000 mL | Freq: Once | INTRAVENOUS | Status: AC | PRN
  Administered 2020-07-16: 100 mL via INTRAVENOUS

## 2020-07-16 MED ORDER — SODIUM CHLORIDE 0.9 % IV BOLUS
500.0000 mL | Freq: Once | INTRAVENOUS | Status: AC
  Administered 2020-07-16: 500 mL via INTRAVENOUS

## 2020-07-16 NOTE — Discharge Instructions (Signed)
Follow-up with primary care provider.  Return for any worsening symptoms

## 2020-07-16 NOTE — ED Triage Notes (Signed)
Referred by PCP for evaluation of shortness of breath

## 2020-07-16 NOTE — ED Provider Notes (Signed)
[facility] EMERGENCY DEPARTMENT Provider Note   CSN: [id number] Arrival date & time: 07/16/20  1126    History Chief Complaint  Patient presents with  . Shortness of Breath    ARDIS FULLWOOD is a 85 y.o. 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  Sudden onset.  Resolved after about 30 minutes.  Patient is not wearing his hearing aids and is significantly hard of hearing, therefore majority of history obtained from wife.  Wife states patient told her this morning he had sudden onset shortness of breath.  States he feels generally "unwell."  He denied any pain.  States he does have intermittent cough however no chronic cough.  No productive cough.  No fever, chills, nausea, vomiting, hemoptysis, diarrhea, dysuria. He denies any paresthesias or unilateral weakness to states he feels "generally weak."  Unfortunately he has had prolonged immobilization over the last year however increased since November when he had surgery on his right knee.  Has been struggling with knee infection with prior prosthesis.  This was removed and antibiotic beads were placed.  He was on IV antibiotics for "weeks."  He is no longer on antibiotics.  He is now weightbearing as tolerated.  Wears knee immobilizer however wife states patient is not very mobile, gets around with a wheelchair majority of time.  No prior history of PE, DVT, CHF, ACS.  No history of arrhythmias.  Denies additional aggravating or alleviating factors  History obtained from patient, wife Sharma Covert past medical records.  No interpreter is used.  Level 5 caveat- Hard of hearing, Information obtained from wife at bedside  No COVID vaccine Has Flu vaccine  HPI     Past Medical History:  Diagnosis Date  . HOH (hard of hearing)   . Hypertension   . Hypertension     Patient Active Problem List   Diagnosis Date Noted  .  Right knee skin infection   . S/P revision of total knee, right   . Acute pain of right knee   . Pseudomonas infection   . 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 Primary localized osteoarthritis of right knee 06/03/2019    Past Surgical History:  Procedure Laterality Date  . APPLICATION OF WOUND VAC Right 07/31/2019   Procedure: Application Of Wound Vac;  Surgeon: Frederico Hamman, MD;  Location: [facility] OR;  Service: Orthopedics;  Laterality: Right;  . CLOSED REDUCTION FEMORAL SHAFT FRACTURE Left   . FREE FLAP TO EXTREMITY Right 08/05/2019   Procedure: GASTROC FLAP WITH THICKNESS SKINGRAFT TO RIGHT KNEE;  Surgeon: Allena Napoleon, MD;  Location: [facility] OR;  Service: Plastics;  Laterality: Right;  . TOTAL KNEE ARTHROPLASTY Right 06/03/2019   Procedure: TOTAL KNEE ARTHROPLASTY, ORIF MEDIAL CONDYLE, LATERAL RELEASE;  Surgeon: Frederico Hamman, MD;  Location: [facility] ORS;  Service: Orthopedics;  Laterality: Right;  . TOTAL KNEE REVISION Right 07/31/2019   Procedure: RIGHT KNEE IRRIGATION AND DEBRIDEMENT with POLY EXCHANGE and PLACEMENT of STIMULAN BEADS;  Surgeon: Frederico Hamman, MD;  Location: [facility] OR;  Service: Orthopedics;  Laterality: Right;  . TOTAL KNEE REVISION Right 08/05/2019   Procedure: TOTAL KNEE REVISION   INCISION AND DRAINAGE WITH POLY EXCHANGE;  Surgeon: Frederico Hamman, MD;  Location: [facility] OR;  Service: Orthopedics;  Laterality: Right;       Family History  Problem Relation Age of Onset  . Stroke Mother  Social History   Tobacco Use  . Smoking status: Never Smoker  . Smokeless tobacco: Never Used  Vaping Use  . Vaping Use: Never used  Substance Use Topics  . Alcohol use: Never  . Drug use: Never    Home Medications Prior to Admission medications   Medication Sig Start Date End Date Taking? Authorizing Provider  [employer] D3 25 MCG (1000 UT) capsule Take 1,000 Units by mouth daily. 06/29/19   [provider]  docusate sodium (COLACE) 100 MG capsule Take 1  capsule (100 mg total) by mouth 2 (two) times daily. 08/10/19   Chadwell, Ivin BootyJoshua, PA-C  labetalol (NORMODYNE) 300 MG tablet Take 300 mg by mouth 2 (two) times daily.    [provider]  losartan (COZAAR) 100 MG tablet Take 100 mg by mouth daily. 07/11/19   [provider]  metroNIDAZOLE (FLAGYL) 500 MG tablet Take 500 mg by mouth 3 (three) times daily.    [provider]  oxyCODONE (OXY IR/ROXICODONE) 5 MG immediate release tablet Take 1-2 tablets (5-10 mg total) by mouth every 4 (four) hours as needed for breakthrough pain ((for MODERATE breakthrough pain)). 08/10/19   Chadwell, Ivin BootyJoshua, PA-C    Allergies    Patient has no known allergies.  Review of Systems   Review of Systems  Constitutional: Positive for fatigue.  Respiratory: Positive for cough and shortness of breath. Negative for apnea, choking, chest tightness, wheezing and stridor.   Cardiovascular: Negative.   Gastrointestinal: Negative.   Genitourinary: Negative.   Musculoskeletal: Negative.   Skin: Negative.   Neurological: Positive for weakness (Generalized).  All other systems reviewed and are negative.   Physical Exam Updated Vital Signs BP (!) 153/69   Pulse 85   Temp 98 F (36.7 C) (Oral)   Resp (!) 26   SpO2 92%   Physical Exam Vitals and nursing note reviewed.  Constitutional:      General: He is not in acute distress.    Appearance: He is well-developed and well-nourished. He is ill-appearing (Chronically ill appearing). He is not toxic-appearing or diaphoretic.  HENT:     Head: Normocephalic and atraumatic.     Mouth/Throat:     Pharynx: Oropharynx is clear.     Comments: Dry mucous membranes Eyes:     Pupils: Pupils are equal, round, and reactive to light.  Cardiovascular:     Rate and Rhythm: Regular rhythm. Tachycardia present.     Pulses:          Dorsalis pedis pulses are 2+ on the right side and 2+ on the left side.     Comments: Sinus tachycardia Pulmonary:      Effort: Pulmonary effort is normal. No respiratory distress.     Breath sounds: Normal breath sounds.     Comments: Speaks in full sentences without difficulty Chest:     Chest wall: No mass, tenderness, crepitus or edema.  Abdominal:     General: Bowel sounds are normal. There is no distension.     Palpations: Abdomen is soft.     Comments: Soft non tender without rebound or guarding   Musculoskeletal:        General: Normal range of motion.     Cervical back: Normal range of motion and neck supple.     Right lower leg: Tenderness present. No edema.     Left lower leg: No tenderness. No edema.       Legs:     Comments: Flexes and extends up bilateral  legs.  Right near with multiple prior surgical incisions.  No erythema, warmth, drainage from wounds.  Does have some tenderness to popliteal fossa to right knee.  His compartments are soft.  His Denna Haggard' sign is negative.  Skin:    General: Skin is warm and dry.     Capillary Refill: Capillary refill takes 2 to 3 seconds.     Comments: Old surgical incisions to right knee.  No erythema, warmth, bleeding or drainage  Neurological:     General: No focal deficit present.     Mental Status: He is alert.     Cranial Nerves: Cranial nerves are intact.     Sensory: Sensation is intact.     Motor: Motor function is intact.     Comments: Cranial nerves II through XII grossly intact Intact sensation Unable to ambulate  Psychiatric:        Mood and Affect: Mood and affect normal.    ED Results / Procedures / Treatments   Labs (all labs ordered are listed, but only abnormal results are displayed) Labs Reviewed  CBC WITH DIFFERENTIAL/PLATELET - Abnormal; Notable for the following components:      Result Value   WBC 11.1 (*)    Neutro Abs 8.9 (*)    All other components within normal limits  COMPREHENSIVE METABOLIC PANEL - Abnormal; Notable for the following components:   Sodium 131 (*)    Chloride 97 (*)    Glucose, Bld 124 (*)     Total Protein 8.2 (*)    Total Bilirubin 1.5 (*)    All other components within normal limits  URINALYSIS, ROUTINE W REFLEX MICROSCOPIC - Abnormal; Notable for the following components:   Specific Gravity, Urine 1.033 (*)    Protein, ur 30 (*)    Bacteria, UA RARE (*)    All other components within normal limits  CULTURE, BLOOD (ROUTINE X 2)  CULTURE, BLOOD (ROUTINE X 2)  RESP PANEL BY RT-PCR (FLU A&B, COVID) ARPGX2  LACTIC ACID, PLASMA  TROPONIN I (HIGH SENSITIVITY)  TROPONIN I (HIGH SENSITIVITY)    EKG EKG Interpretation  Date/Time:  Monday July 16 2020 13:11:13 EST Ventricular Rate:  110 PR Interval:    QRS Duration: 80 QT Interval:  306 QTC Calculation: 414 R Axis:   54 Text Interpretation: Sinus tachycardia Atrial premature complexes Abnormal T, consider ischemia, diffuse leads Confirmed by Bethann Berkshire [phone]) on 07/16/2020 1:52:33 PM   Radiology CT Angio Chest PE W and/or Wo Contrast  Result Date: 07/16/2020 CLINICAL DATA:  Shortness of breath, epigastric abdominal pain. EXAM: CT ANGIOGRAPHY CHEST CT ABDOMEN AND PELVIS WITH CONTRAST TECHNIQUE: Multidetector CT imaging of the chest was performed using the standard protocol during bolus administration of intravenous contrast. Multiplanar CT image reconstructions and MIPs were obtained to evaluate the vascular anatomy. Multidetector CT imaging of the abdomen and pelvis was performed using the standard protocol during bolus administration of intravenous contrast. CONTRAST:  OMNIPAQUE IOHEXOL 350 MG/ML SOLN COMPARISON:  None. FINDINGS: CTA CHEST FINDINGS Cardiovascular: Satisfactory opacification of the pulmonary arteries to the segmental level. No evidence of pulmonary embolism. Normal heart size. No pericardial effusion. Atherosclerosis of thoracic aorta is noted without aneurysm or dissection. Mediastinum/Nodes: Small sliding-type hiatal hernia is noted. No adenopathy is noted. Thyroid gland is unremarkable.  Lungs/Pleura: Lungs are clear. No pleural effusion or pneumothorax. Musculoskeletal: No chest wall abnormality. No acute or significant osseous findings. Review of the MIP images confirms the above findings. CT ABDOMEN and PELVIS FINDINGS Hepatobiliary:  Minimal cholelithiasis is noted. No biliary dilatation is noted. The liver is unremarkable. Pancreas: Unremarkable. No pancreatic ductal dilatation or surrounding inflammatory changes. Spleen: Normal in size without focal abnormality. Adrenals/Urinary Tract: Adrenal glands are unremarkable. Kidneys are normal, without renal calculi, focal lesion, or hydronephrosis. Bladder is unremarkable. Stomach/Bowel: The stomach appears normal. There is no evidence of bowel obstruction or inflammation. Diverticulosis of descending and sigmoid colon is noted without inflammation. The appendix is not visualized, but no inflammation is noted in the right lower quadrant. Vascular/Lymphatic: Aortic atherosclerosis. No enlarged abdominal or pelvic lymph nodes. Reproductive: Mild prostatic enlargement is noted. Other: Moderate size fat containing right inguinal hernia is noted. No ascites is noted. Musculoskeletal: No acute or significant osseous findings. Review of the MIP images confirms the above findings. IMPRESSION: 1. No definite evidence of pulmonary embolus. 2. Small sliding-type hiatal hernia. 3. Minimal cholelithiasis. 4. Diverticulosis of descending and sigmoid colon is noted without inflammation. 5. Moderate size fat containing right inguinal hernia is noted. 6. Mild prostatic enlargement. 7. Aortic atherosclerosis. Aortic Atherosclerosis (ICD10-I70.0). Electronically Signed   By: Lupita RaiderJames  Green Jr M.D.   On: 07/16/2020 15:26   CT Abdomen Pelvis W Contrast  Result Date: 07/16/2020 CLINICAL DATA:  Shortness of breath, epigastric abdominal pain. EXAM: CT ANGIOGRAPHY CHEST CT ABDOMEN AND PELVIS WITH CONTRAST TECHNIQUE: Multidetector CT imaging of the chest was performed using  the standard protocol during bolus administration of intravenous contrast. Multiplanar CT image reconstructions and MIPs were obtained to evaluate the vascular anatomy. Multidetector CT imaging of the abdomen and pelvis was performed using the standard protocol during bolus administration of intravenous contrast. CONTRAST:  100mL OMNIPAQUE IOHEXOL 350 MG/ML SOLN COMPARISON:  None. FINDINGS: CTA CHEST FINDINGS Cardiovascular: Satisfactory opacification of the pulmonary arteries to the segmental level. No evidence of pulmonary embolism. Normal heart size. No pericardial effusion. Atherosclerosis of thoracic aorta is noted without aneurysm or dissection. Mediastinum/Nodes: Small sliding-type hiatal hernia is noted. No adenopathy is noted. Thyroid gland is unremarkable. Lungs/Pleura: Lungs are clear. No pleural effusion or pneumothorax. Musculoskeletal: No chest wall abnormality. No acute or significant osseous findings. Review of the MIP images confirms the above findings. CT ABDOMEN and PELVIS FINDINGS Hepatobiliary: Minimal cholelithiasis is noted. No biliary dilatation is noted. The liver is unremarkable. Pancreas: Unremarkable. No pancreatic ductal dilatation or surrounding inflammatory changes. Spleen: Normal in size without focal abnormality. Adrenals/Urinary Tract: Adrenal glands are unremarkable. Kidneys are normal, without renal calculi, focal lesion, or hydronephrosis. Bladder is unremarkable. Stomach/Bowel: The stomach appears normal. There is no evidence of bowel obstruction or inflammation. Diverticulosis of descending and sigmoid colon is noted without inflammation. The appendix is not visualized, but no inflammation is noted in the right lower quadrant. Vascular/Lymphatic: Aortic atherosclerosis. No enlarged abdominal or pelvic lymph nodes. Reproductive: Mild prostatic enlargement is noted. Other: Moderate size fat containing right inguinal hernia is noted. No ascites is noted. Musculoskeletal: No acute  or significant osseous findings. Review of the MIP images confirms the above findings. IMPRESSION: 1. No definite evidence of pulmonary embolus. 2. Small sliding-type hiatal hernia. 3. Minimal cholelithiasis. 4. Diverticulosis of descending and sigmoid colon is noted without inflammation. 5. Moderate size fat containing right inguinal hernia is noted. 6. Mild prostatic enlargement. 7. Aortic atherosclerosis. Aortic Atherosclerosis (ICD10-I70.0). Electronically Signed   By: Lupita RaiderJames  Green Jr M.D.   On: 07/16/2020 15:26   US Venous Img Lower Unilateral Right  Result Date: 07/16/2020 CLINICAL DATA:  Right lower extremity pain. EXAM: RIGHT LOWER EXTREMITY VENOUS DOPPLER ULTRASOUND TECHNIQUE:  Gray-scale sonography with graded compression, as well as color Doppler and duplex ultrasound were performed to evaluate the lower extremity deep venous systems from the level of the common femoral vein and including the common femoral, femoral, profunda femoral, popliteal and calf veins including the posterior tibial, peroneal and gastrocnemius veins when visible. The superficial great saphenous vein was also interrogated. Spectral Doppler was utilized to evaluate flow at rest and with distal augmentation maneuvers in the common femoral, femoral and popliteal veins. COMPARISON:  None. FINDINGS: Contralateral Common Femoral Vein: Respiratory phasicity is normal and symmetric with the symptomatic side. No evidence of thrombus. Normal compressibility. Common Femoral Vein: No evidence of thrombus. Normal compressibility, respiratory phasicity and response to augmentation. Saphenofemoral Junction: No evidence of thrombus. Normal compressibility and flow on color Doppler imaging. Profunda Femoral Vein: No evidence of thrombus. Normal compressibility and flow on color Doppler imaging. Femoral Vein: No evidence of thrombus. Normal compressibility, respiratory phasicity and response to augmentation. Popliteal Vein: No evidence of  thrombus. Normal compressibility, respiratory phasicity and response to augmentation. Calf Veins: No evidence of thrombus. Normal compressibility and flow on color Doppler imaging. Superficial Great Saphenous Vein: No evidence of thrombus. Normal compressibility. Venous Reflux:  None. Other Findings: No evidence of superficial thrombophlebitis or abnormal fluid collection. IMPRESSION: No evidence of right lower extremity deep venous thrombosis. Electronically Signed   By: Irish Lack M.D.   On: 07/16/2020 14:13   DG Chest Portable 1 View  Result Date: 07/16/2020 CLINICAL DATA:  Shortness of breath EXAM: PORTABLE CHEST 1 VIEW COMPARISON:  July 30, 2019 FINDINGS: There is mild right base atelectasis. Lungs elsewhere are clear. Heart size and pulmonary vascularity are normal. No adenopathy. There is aortic atherosclerosis. There is degenerative change in each shoulder with superior migration of each humeral head. IMPRESSION: Mild right base atelectasis. No edema or airspace opacity. Heart size normal. Probable chronic rotator cuff tears bilaterally Aortic Atherosclerosis (ICD10-I70.0). Electronically Signed   By: Bretta Bang III M.D.   On: 07/16/2020 12:59    Procedures Procedures (including critical care time)  Medications Ordered in ED Medications  sodium chloride 0.9 % bolus 500 mL (0 mLs Intravenous Stopped 07/16/20 1600)  iohexol (OMNIPAQUE) 350 MG/ML injection 100 mL (100 mLs Intravenous Contrast Given 07/16/20 1452)  labetalol (NORMODYNE) injection 20 mg (20 mg Intravenous Given 07/16/20 1639)   ED Course  I have reviewed the triage vital signs and the nursing notes.  Pertinent labs & imaging results that were available during my care of the patient were reviewed by me and considered in my medical decision making (see chart for details).  84 year old presents for evaluation of shortness of breath.  Started when he woke up this morning.  Has had a mild, intermittent cough.  No sick  contacts.  He has not vaccine against COVID.  Does not appear grossly fluid overloaded.  His heart and lungs are clear.  He does have some sinus tachycardia.  His abdomen is soft, nontender.  He is neurovascularly intact.  Does have multiple surgical sites to his right knee however these do not look obviously infected.  Does have some tenderness to his right popliteal fossa.  His compartments are soft.  There is negative Homans' sign.  He is hypertensive.  Has a history of hypertension over his distal pulses and tactile temperature to extremities.  We will plan on labs, imaging and reassess.  Labs and imaging personally reviewed and interpreted:  CBC with leukocytosis at 11.1 CMP mild hyponatremia, chronic, glucose  124, Tbili 1.5 Trop 11<<<12, flat Lactic acid 1.2 Blood culture pending CTA chest without acute changes CT AP without acute changes DG chest right base atelectasis [place] right lower extremities without DVT EKG without ischemic changes COVID negative, Flu neg  Patient reassessed.  Still does have some mild tachycardia as well as some hypertension.  Wife states he has not taken his medications today.  Is on large dose of labetalol, 600 mg daily.  We will give some labetalol here.  He denies any current pain, shortness of breath.  He is pending his second troponin.  Patient reassessed.  Delta troponin flat.  Patient wife states he would like to go home.  He continues to be asymptomatic throughout his ED stay.  I did give him his home dose of beta-blocker which she takes a daily which helped his tachycardia as well as his hypertension.  I encouraged close follow-up with PCP or return for any worsening symptoms.  Family is agreeable for this.  The patient has been appropriately medically screened and/or stabilized in the ED. I have low suspicion for any other emergent medical condition which would require further screening, evaluation or treatment in the ED or require inpatient  management.  Patient is hemodynamically stable and in no acute distress.  Patient able to ambulate in department prior to ED.  Evaluation does not show acute pathology that would require ongoing or additional emergent interventions while in the emergency department or further inpatient treatment.  I have discussed the diagnosis with the patient and answered all questions.  Pain is been managed while in the emergency department and patient has no further complaints prior to discharge.  Patient is comfortable with plan discussed in room and is stable for discharge at this time.  I have discussed strict return precautions for returning to the emergency department.  Patient was encouraged to follow-up with PCP/specialist refer to at discharge.    MDM Rules/Calculators/A&P                          KENNER LEWAN was evaluated in Emergency Department on 07/16/2020 for the symptoms described in the history of present illness. He was evaluated in the context of the global COVID-19 pandemic, which necessitated consideration that the patient might be at risk for infection with the SARS-CoV-2 virus that causes COVID-19. Institutional protocols and algorithms that pertain to the evaluation of patients at risk for COVID-19 are in a state of rapid change based on information released by regulatory bodies including the CDC and federal and state organizations. These policies and algorithms were followed during the patient's care in the ED. Final Clinical Impression(s) / ED Diagnoses Final diagnoses:  SOB (shortness of breath)    Rx / DC Orders ED Discharge Orders    None       Halden Phegley A, PA-C 07/16/20 1755    Bethann Berkshire, MD 07/21/20 1352

## 2020-07-18 DIAGNOSIS — I129 Hypertensive chronic kidney disease with stage 1 through stage 4 chronic kidney disease, or unspecified chronic kidney disease: Secondary | ICD-10-CM | POA: Diagnosis not present

## 2020-07-18 DIAGNOSIS — N182 Chronic kidney disease, stage 2 (mild): Secondary | ICD-10-CM | POA: Diagnosis not present

## 2020-07-18 DIAGNOSIS — T8453XD Infection and inflammatory reaction due to internal right knee prosthesis, subsequent encounter: Secondary | ICD-10-CM | POA: Diagnosis not present

## 2020-07-18 DIAGNOSIS — K5900 Constipation, unspecified: Secondary | ICD-10-CM | POA: Diagnosis not present

## 2020-07-18 DIAGNOSIS — E871 Hypo-osmolality and hyponatremia: Secondary | ICD-10-CM | POA: Diagnosis not present

## 2020-07-18 DIAGNOSIS — D62 Acute posthemorrhagic anemia: Secondary | ICD-10-CM | POA: Diagnosis not present

## 2020-07-21 LAB — CULTURE, BLOOD (ROUTINE X 2)
Culture: NO GROWTH
Culture: NO GROWTH
Special Requests: ADEQUATE
Special Requests: ADEQUATE

## 2020-07-23 DIAGNOSIS — N182 Chronic kidney disease, stage 2 (mild): Secondary | ICD-10-CM | POA: Diagnosis not present

## 2020-07-23 DIAGNOSIS — E871 Hypo-osmolality and hyponatremia: Secondary | ICD-10-CM | POA: Diagnosis not present

## 2020-07-23 DIAGNOSIS — D62 Acute posthemorrhagic anemia: Secondary | ICD-10-CM | POA: Diagnosis not present

## 2020-07-23 DIAGNOSIS — K5900 Constipation, unspecified: Secondary | ICD-10-CM | POA: Diagnosis not present

## 2020-07-23 DIAGNOSIS — T8453XD Infection and inflammatory reaction due to internal right knee prosthesis, subsequent encounter: Secondary | ICD-10-CM | POA: Diagnosis not present

## 2020-07-23 DIAGNOSIS — I129 Hypertensive chronic kidney disease with stage 1 through stage 4 chronic kidney disease, or unspecified chronic kidney disease: Secondary | ICD-10-CM | POA: Diagnosis not present

## 2020-07-27 DIAGNOSIS — E871 Hypo-osmolality and hyponatremia: Secondary | ICD-10-CM | POA: Diagnosis not present

## 2020-07-27 DIAGNOSIS — D62 Acute posthemorrhagic anemia: Secondary | ICD-10-CM | POA: Diagnosis not present

## 2020-07-27 DIAGNOSIS — N182 Chronic kidney disease, stage 2 (mild): Secondary | ICD-10-CM | POA: Diagnosis not present

## 2020-07-27 DIAGNOSIS — T8453XD Infection and inflammatory reaction due to internal right knee prosthesis, subsequent encounter: Secondary | ICD-10-CM | POA: Diagnosis not present

## 2020-07-27 DIAGNOSIS — K5900 Constipation, unspecified: Secondary | ICD-10-CM | POA: Diagnosis not present

## 2020-07-27 DIAGNOSIS — I129 Hypertensive chronic kidney disease with stage 1 through stage 4 chronic kidney disease, or unspecified chronic kidney disease: Secondary | ICD-10-CM | POA: Diagnosis not present

## 2020-07-30 DIAGNOSIS — J208 Acute bronchitis due to other specified organisms: Secondary | ICD-10-CM | POA: Diagnosis not present

## 2020-08-07 DIAGNOSIS — K5900 Constipation, unspecified: Secondary | ICD-10-CM | POA: Diagnosis not present

## 2020-08-07 DIAGNOSIS — E871 Hypo-osmolality and hyponatremia: Secondary | ICD-10-CM | POA: Diagnosis not present

## 2020-08-07 DIAGNOSIS — N182 Chronic kidney disease, stage 2 (mild): Secondary | ICD-10-CM | POA: Diagnosis not present

## 2020-08-07 DIAGNOSIS — D62 Acute posthemorrhagic anemia: Secondary | ICD-10-CM | POA: Diagnosis not present

## 2020-08-07 DIAGNOSIS — T8453XD Infection and inflammatory reaction due to internal right knee prosthesis, subsequent encounter: Secondary | ICD-10-CM | POA: Diagnosis not present

## 2020-08-07 DIAGNOSIS — I129 Hypertensive chronic kidney disease with stage 1 through stage 4 chronic kidney disease, or unspecified chronic kidney disease: Secondary | ICD-10-CM | POA: Diagnosis not present

## 2020-08-14 DIAGNOSIS — M5459 Other low back pain: Secondary | ICD-10-CM | POA: Diagnosis not present

## 2020-08-14 DIAGNOSIS — N3091 Cystitis, unspecified with hematuria: Secondary | ICD-10-CM | POA: Diagnosis not present

## 2020-08-15 DIAGNOSIS — J9811 Atelectasis: Secondary | ICD-10-CM | POA: Diagnosis not present

## 2020-08-15 DIAGNOSIS — K449 Diaphragmatic hernia without obstruction or gangrene: Secondary | ICD-10-CM | POA: Diagnosis not present

## 2020-08-15 DIAGNOSIS — I2510 Atherosclerotic heart disease of native coronary artery without angina pectoris: Secondary | ICD-10-CM | POA: Diagnosis not present

## 2020-08-15 DIAGNOSIS — J189 Pneumonia, unspecified organism: Secondary | ICD-10-CM | POA: Diagnosis not present

## 2020-08-15 DIAGNOSIS — I10 Essential (primary) hypertension: Secondary | ICD-10-CM | POA: Diagnosis not present

## 2020-08-15 DIAGNOSIS — S2241XA Multiple fractures of ribs, right side, initial encounter for closed fracture: Secondary | ICD-10-CM | POA: Diagnosis not present

## 2020-08-15 DIAGNOSIS — F0391 Unspecified dementia with behavioral disturbance: Secondary | ICD-10-CM | POA: Diagnosis not present

## 2020-08-15 DIAGNOSIS — R0902 Hypoxemia: Secondary | ICD-10-CM | POA: Diagnosis not present

## 2020-08-15 DIAGNOSIS — R0602 Shortness of breath: Secondary | ICD-10-CM | POA: Diagnosis not present

## 2020-08-15 DIAGNOSIS — R059 Cough, unspecified: Secondary | ICD-10-CM | POA: Diagnosis not present

## 2020-08-15 DIAGNOSIS — H9190 Unspecified hearing loss, unspecified ear: Secondary | ICD-10-CM | POA: Diagnosis not present

## 2020-08-15 DIAGNOSIS — Z20822 Contact with and (suspected) exposure to covid-19: Secondary | ICD-10-CM | POA: Diagnosis not present

## 2020-08-16 DIAGNOSIS — Z20822 Contact with and (suspected) exposure to covid-19: Secondary | ICD-10-CM | POA: Diagnosis not present

## 2020-08-16 DIAGNOSIS — R0902 Hypoxemia: Secondary | ICD-10-CM | POA: Diagnosis not present

## 2020-08-16 DIAGNOSIS — J189 Pneumonia, unspecified organism: Secondary | ICD-10-CM | POA: Diagnosis not present

## 2020-08-17 DIAGNOSIS — J189 Pneumonia, unspecified organism: Secondary | ICD-10-CM | POA: Diagnosis present

## 2020-08-17 DIAGNOSIS — J9811 Atelectasis: Secondary | ICD-10-CM | POA: Diagnosis not present

## 2020-08-17 DIAGNOSIS — R451 Restlessness and agitation: Secondary | ICD-10-CM | POA: Diagnosis not present

## 2020-08-17 DIAGNOSIS — F0390 Unspecified dementia without behavioral disturbance: Secondary | ICD-10-CM | POA: Diagnosis not present

## 2020-08-17 DIAGNOSIS — H9190 Unspecified hearing loss, unspecified ear: Secondary | ICD-10-CM | POA: Diagnosis present

## 2020-08-17 DIAGNOSIS — K449 Diaphragmatic hernia without obstruction or gangrene: Secondary | ICD-10-CM | POA: Diagnosis not present

## 2020-08-17 DIAGNOSIS — Z20822 Contact with and (suspected) exposure to covid-19: Secondary | ICD-10-CM | POA: Diagnosis present

## 2020-08-17 DIAGNOSIS — I10 Essential (primary) hypertension: Secondary | ICD-10-CM | POA: Diagnosis present

## 2020-08-17 DIAGNOSIS — S2241XA Multiple fractures of ribs, right side, initial encounter for closed fracture: Secondary | ICD-10-CM | POA: Diagnosis not present

## 2020-08-17 DIAGNOSIS — I2510 Atherosclerotic heart disease of native coronary artery without angina pectoris: Secondary | ICD-10-CM | POA: Diagnosis not present

## 2020-08-17 DIAGNOSIS — R0902 Hypoxemia: Secondary | ICD-10-CM | POA: Diagnosis present

## 2020-08-17 DIAGNOSIS — F0391 Unspecified dementia with behavioral disturbance: Secondary | ICD-10-CM | POA: Diagnosis present

## 2020-09-24 DIAGNOSIS — J40 Bronchitis, not specified as acute or chronic: Secondary | ICD-10-CM | POA: Diagnosis not present

## 2020-10-31 DIAGNOSIS — M25561 Pain in right knee: Secondary | ICD-10-CM | POA: Diagnosis not present

## 2020-10-31 DIAGNOSIS — R5383 Other fatigue: Secondary | ICD-10-CM | POA: Diagnosis not present

## 2020-10-31 DIAGNOSIS — R627 Adult failure to thrive: Secondary | ICD-10-CM | POA: Diagnosis not present

## 2020-11-29 DIAGNOSIS — H6243 Otitis externa in other diseases classified elsewhere, bilateral: Secondary | ICD-10-CM | POA: Diagnosis not present

## 2020-12-03 DIAGNOSIS — R262 Difficulty in walking, not elsewhere classified: Secondary | ICD-10-CM | POA: Diagnosis not present

## 2020-12-03 DIAGNOSIS — M79604 Pain in right leg: Secondary | ICD-10-CM | POA: Diagnosis not present

## 2020-12-11 DIAGNOSIS — R262 Difficulty in walking, not elsewhere classified: Secondary | ICD-10-CM | POA: Diagnosis not present

## 2020-12-11 DIAGNOSIS — M79604 Pain in right leg: Secondary | ICD-10-CM | POA: Diagnosis not present

## 2020-12-13 DIAGNOSIS — R262 Difficulty in walking, not elsewhere classified: Secondary | ICD-10-CM | POA: Diagnosis not present

## 2020-12-13 DIAGNOSIS — M79604 Pain in right leg: Secondary | ICD-10-CM | POA: Diagnosis not present

## 2020-12-17 DIAGNOSIS — R262 Difficulty in walking, not elsewhere classified: Secondary | ICD-10-CM | POA: Diagnosis not present

## 2020-12-17 DIAGNOSIS — M79604 Pain in right leg: Secondary | ICD-10-CM | POA: Diagnosis not present

## 2020-12-19 DIAGNOSIS — R262 Difficulty in walking, not elsewhere classified: Secondary | ICD-10-CM | POA: Diagnosis not present

## 2020-12-19 DIAGNOSIS — M79604 Pain in right leg: Secondary | ICD-10-CM | POA: Diagnosis not present

## 2020-12-24 DIAGNOSIS — M79604 Pain in right leg: Secondary | ICD-10-CM | POA: Diagnosis not present

## 2020-12-24 DIAGNOSIS — R262 Difficulty in walking, not elsewhere classified: Secondary | ICD-10-CM | POA: Diagnosis not present

## 2020-12-26 DIAGNOSIS — M79604 Pain in right leg: Secondary | ICD-10-CM | POA: Diagnosis not present

## 2020-12-26 DIAGNOSIS — R262 Difficulty in walking, not elsewhere classified: Secondary | ICD-10-CM | POA: Diagnosis not present

## 2021-01-07 DIAGNOSIS — M25511 Pain in right shoulder: Secondary | ICD-10-CM | POA: Diagnosis not present

## 2021-01-07 DIAGNOSIS — M79605 Pain in left leg: Secondary | ICD-10-CM | POA: Diagnosis not present

## 2021-01-07 DIAGNOSIS — M25561 Pain in right knee: Secondary | ICD-10-CM | POA: Diagnosis not present

## 2021-01-07 DIAGNOSIS — F331 Major depressive disorder, recurrent, moderate: Secondary | ICD-10-CM | POA: Diagnosis not present

## 2021-01-08 DIAGNOSIS — R262 Difficulty in walking, not elsewhere classified: Secondary | ICD-10-CM | POA: Diagnosis not present

## 2021-01-08 DIAGNOSIS — M79604 Pain in right leg: Secondary | ICD-10-CM | POA: Diagnosis not present

## 2021-01-14 DIAGNOSIS — R262 Difficulty in walking, not elsewhere classified: Secondary | ICD-10-CM | POA: Diagnosis not present

## 2021-01-14 DIAGNOSIS — M79604 Pain in right leg: Secondary | ICD-10-CM | POA: Diagnosis not present

## 2021-01-16 DIAGNOSIS — R262 Difficulty in walking, not elsewhere classified: Secondary | ICD-10-CM | POA: Diagnosis not present

## 2021-01-16 DIAGNOSIS — M79604 Pain in right leg: Secondary | ICD-10-CM | POA: Diagnosis not present

## 2021-01-21 DIAGNOSIS — M79604 Pain in right leg: Secondary | ICD-10-CM | POA: Diagnosis not present

## 2021-01-21 DIAGNOSIS — R262 Difficulty in walking, not elsewhere classified: Secondary | ICD-10-CM | POA: Diagnosis not present

## 2021-02-26 DIAGNOSIS — N3000 Acute cystitis without hematuria: Secondary | ICD-10-CM | POA: Diagnosis not present

## 2021-02-26 DIAGNOSIS — R4182 Altered mental status, unspecified: Secondary | ICD-10-CM | POA: Diagnosis not present

## 2021-02-26 DIAGNOSIS — N400 Enlarged prostate without lower urinary tract symptoms: Secondary | ICD-10-CM | POA: Diagnosis not present

## 2021-04-22 DIAGNOSIS — F0280 Dementia in other diseases classified elsewhere without behavioral disturbance: Secondary | ICD-10-CM | POA: Diagnosis not present

## 2021-04-22 DIAGNOSIS — N3000 Acute cystitis without hematuria: Secondary | ICD-10-CM | POA: Diagnosis not present

## 2021-08-06 DIAGNOSIS — J40 Bronchitis, not specified as acute or chronic: Secondary | ICD-10-CM | POA: Diagnosis not present

## 2021-08-06 DIAGNOSIS — N3000 Acute cystitis without hematuria: Secondary | ICD-10-CM | POA: Diagnosis not present

## 2021-08-20 DIAGNOSIS — J40 Bronchitis, not specified as acute or chronic: Secondary | ICD-10-CM | POA: Diagnosis not present

## 2021-11-14 IMAGING — RF DG KNEE 1-2V*R*
1 series · 9 of 9 positions shown · non-contrast
Comparison: None.

CLINICAL DATA: Intraoperative imaging for right knee replacement.

EXAM:
RIGHT KNEE - 1-2 VIEW

[Series 1: run · 9 of 9 slices shown]
[im 1/9]
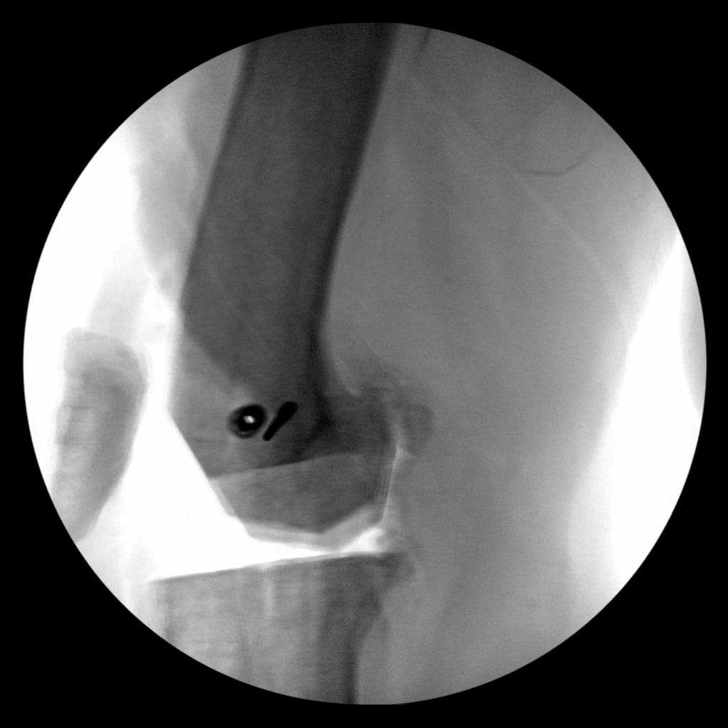
[im 2/9]
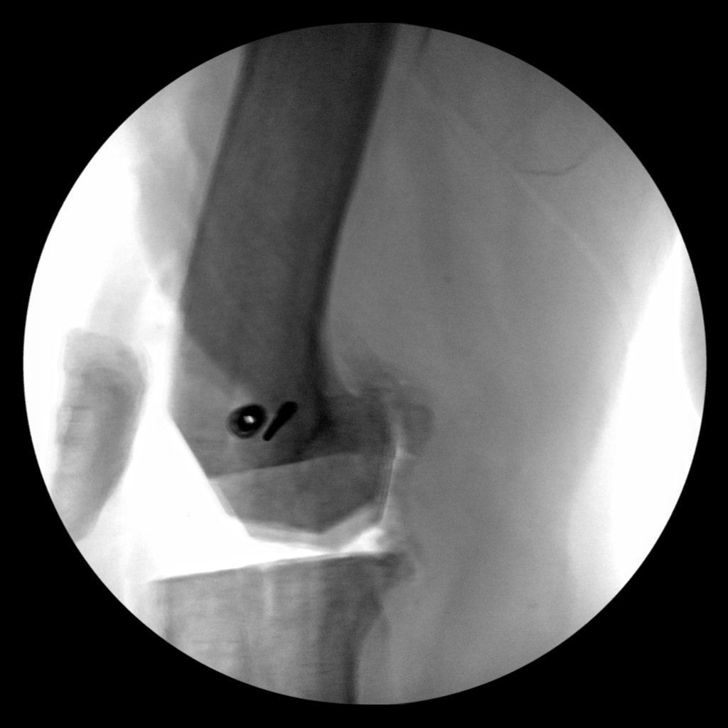
[im 3/9]
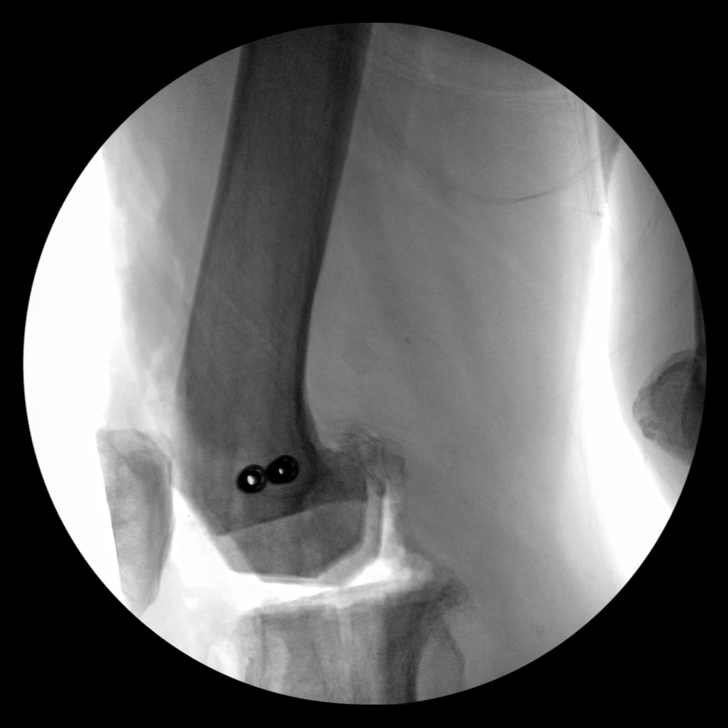
[im 4/9]
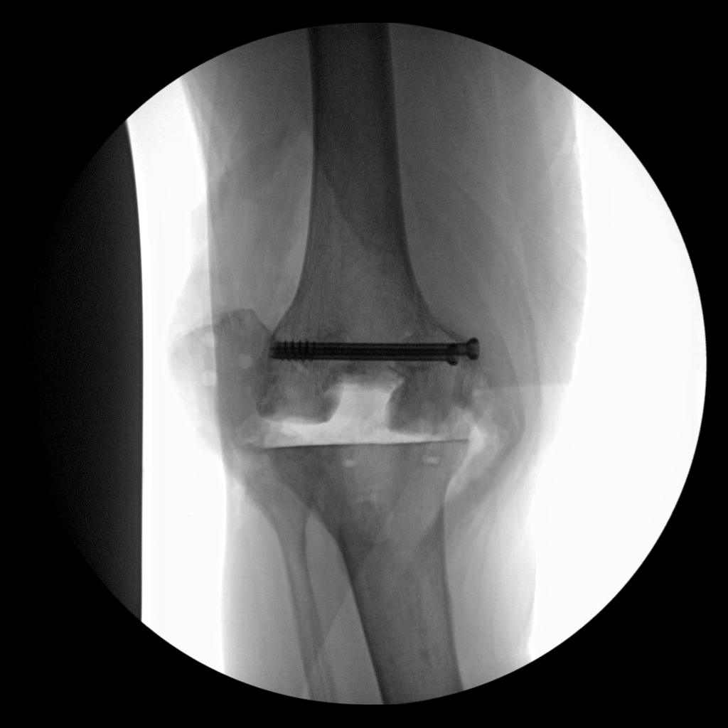
[im 5/9]
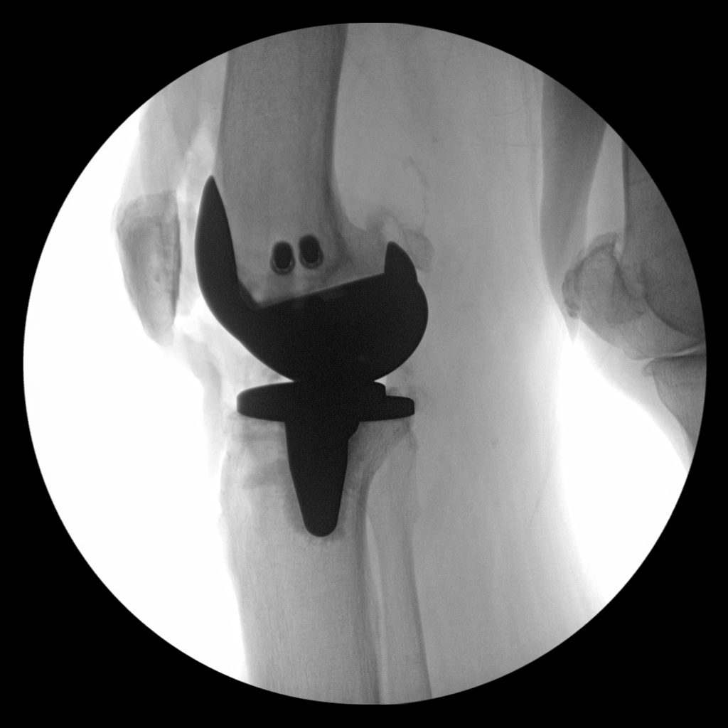
[im 6/9]
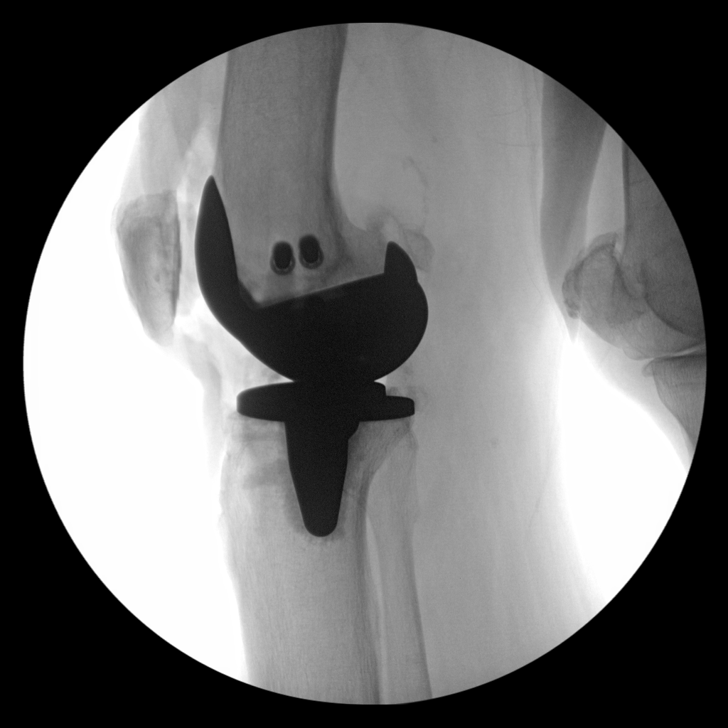
[im 7/9]
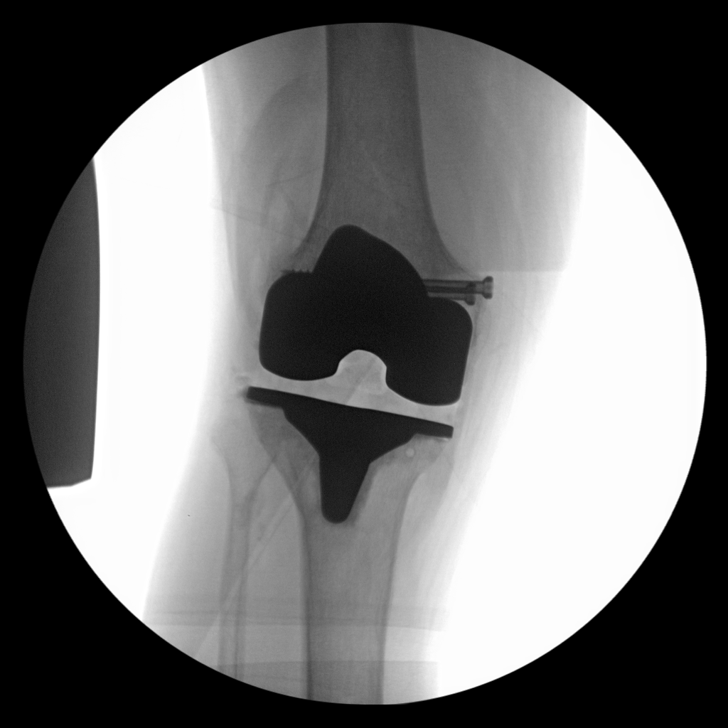
[im 8/9]
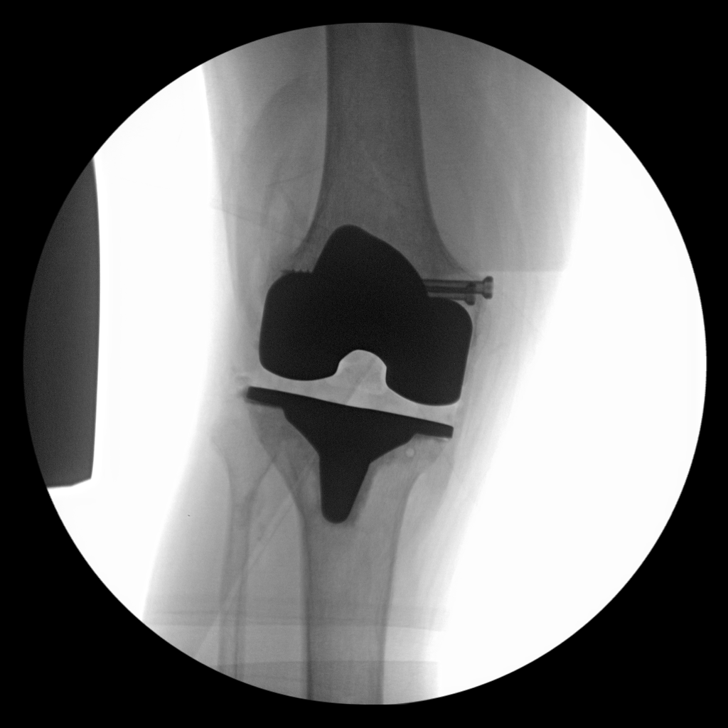
[im 9/9]
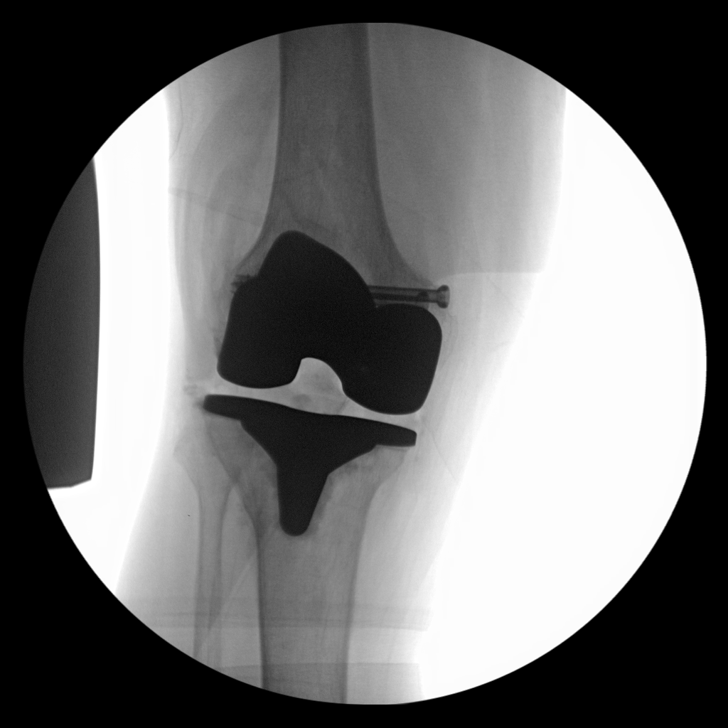

[9 of 9 positions shown; findings below may reference images not displayed]

FINDINGS: Four fluoroscopic intraoperative spot views of the right knee are
provided. 2 screws fixing a remote distal femur fracture are noted.
Final images demonstrate a knee arthroplasty in place. No acute
abnormality is identified.
IMPRESSION: Intraoperative imaging for right knee replacement.

## 2021-11-28 IMAGING — US US EXTREM LOW VENOUS*R*
1 series · 13 of 24 positions shown · non-contrast
Comparison: None.

CLINICAL DATA: 83-year-old with right leg pain. History of recent
knee replacement.



[Series 1: us extrem low venous*right* · 0.08mm/px · 13 of 48 slices shown]
[im 1/48]
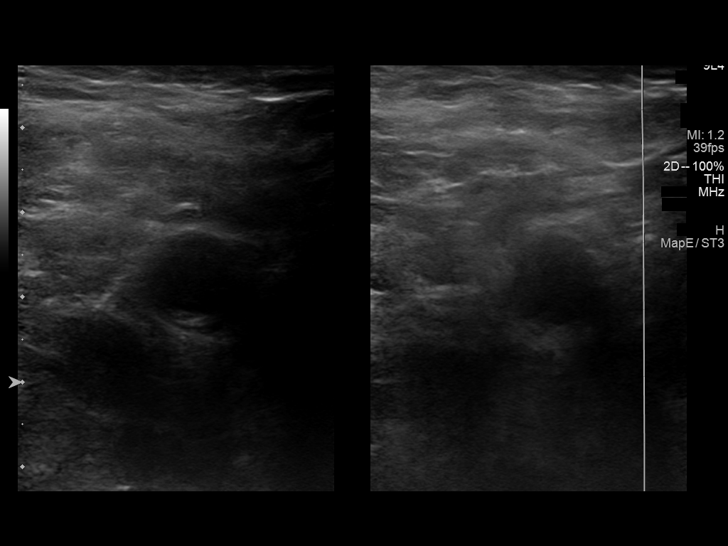
[im 5/48]
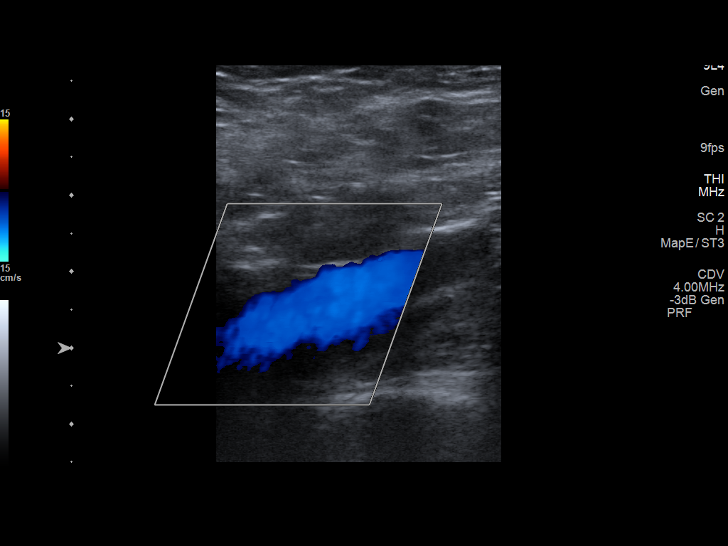
[im 9/48]
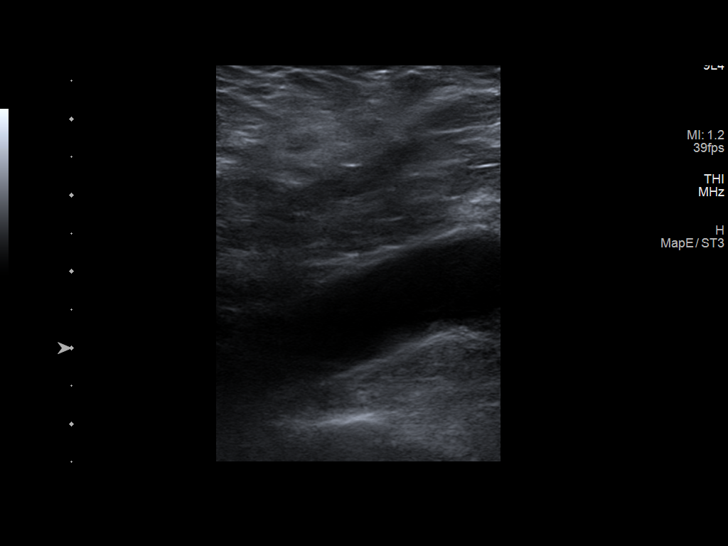
[im 13/48]
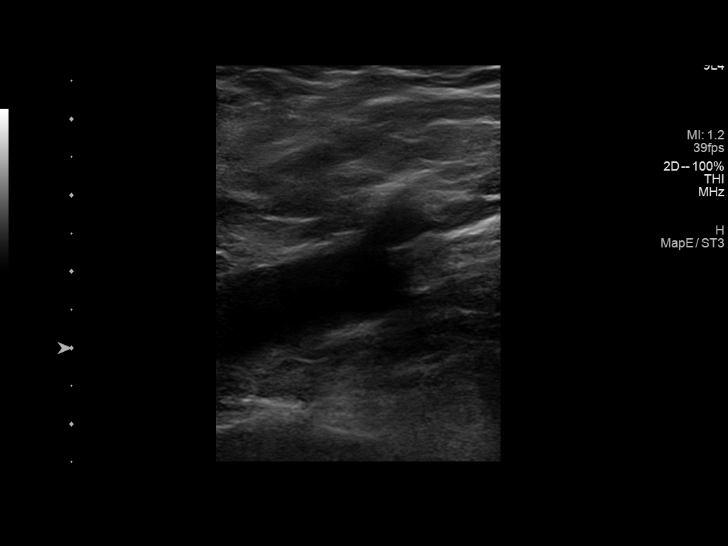
[im 17/48]
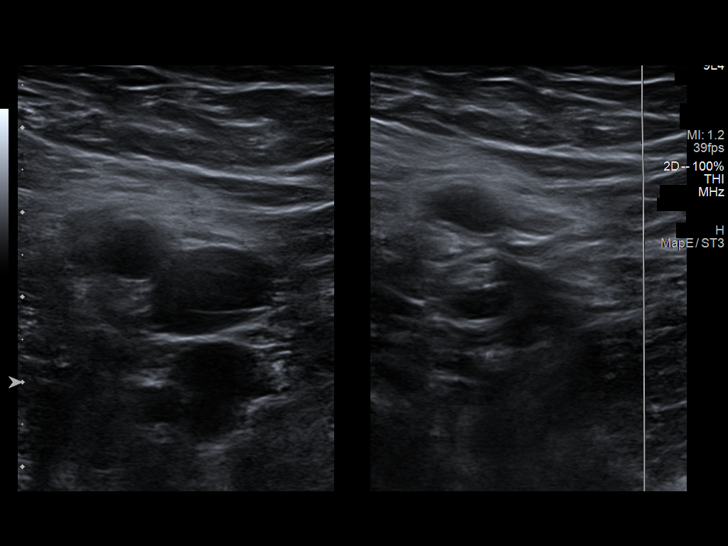
[im 21/48]
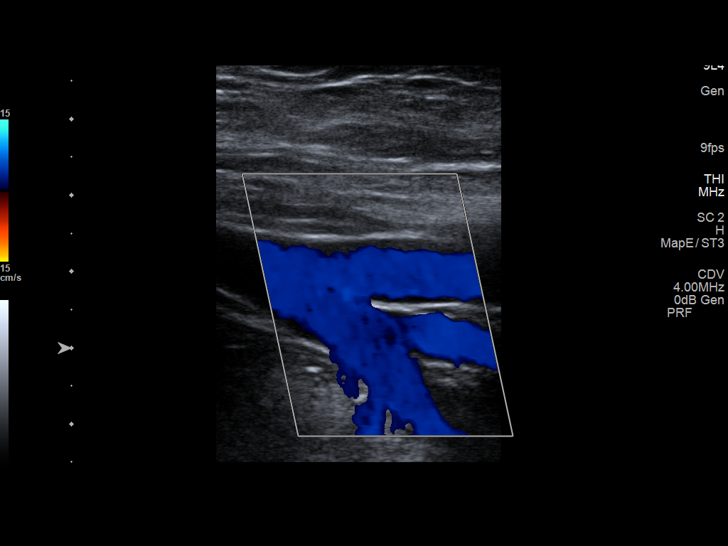
[im 25/48]
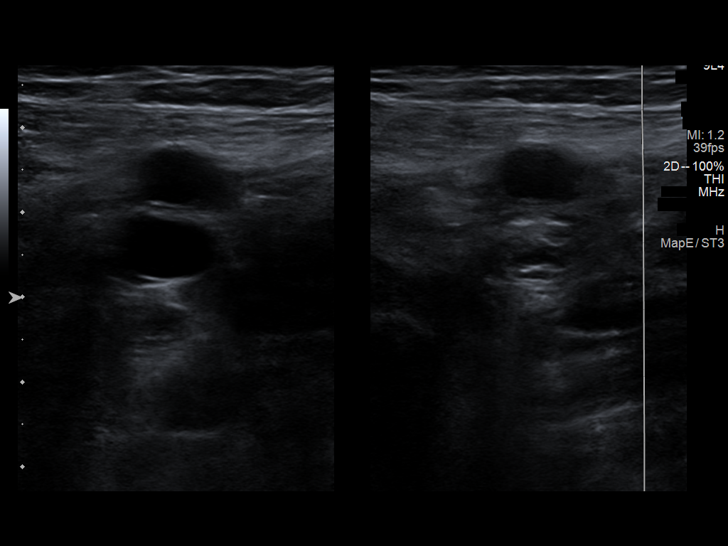
[im 27/48]
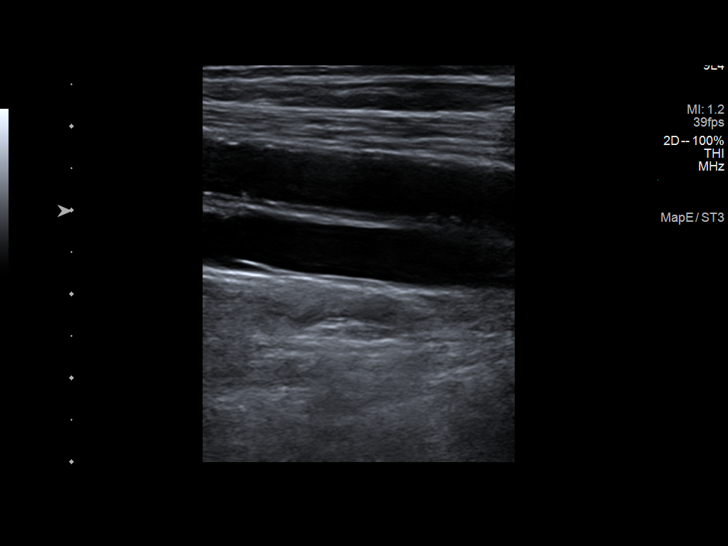
[im 31/48]
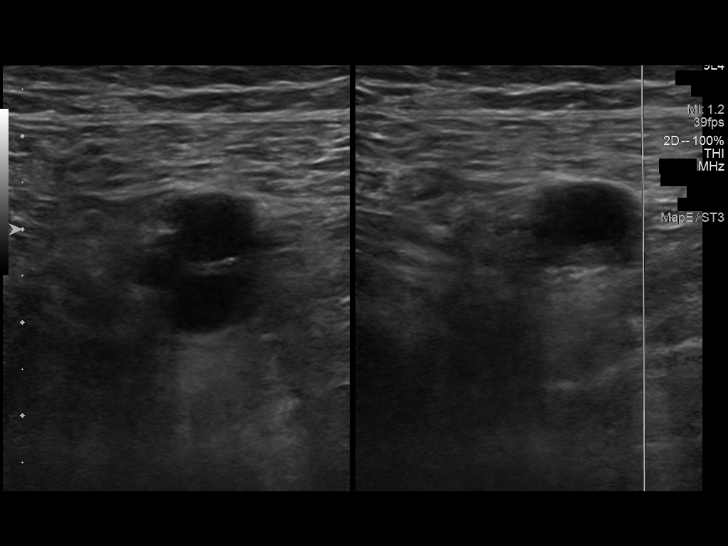
[im 35/48]
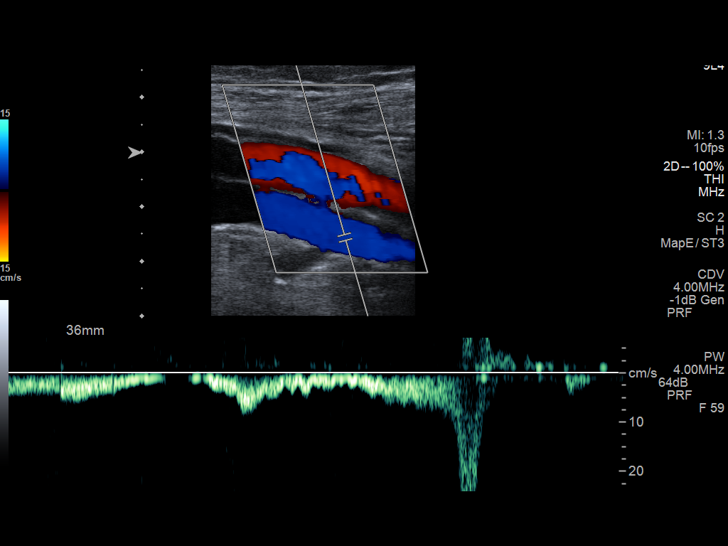
[im 39/48]
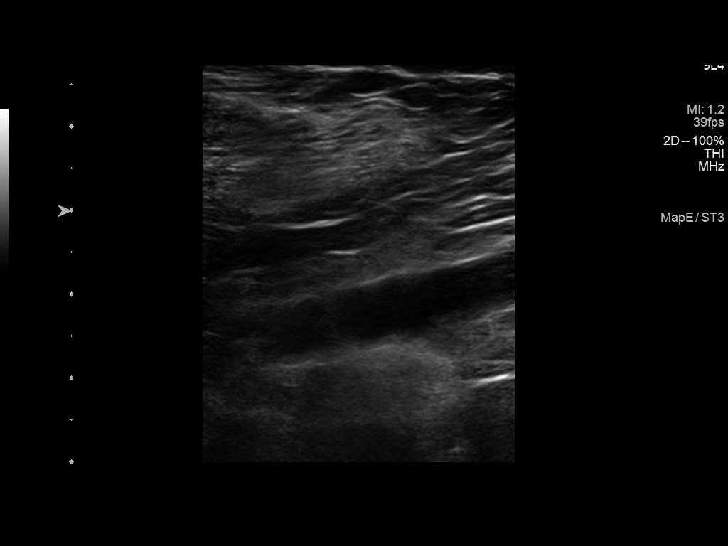
[im 43/48]
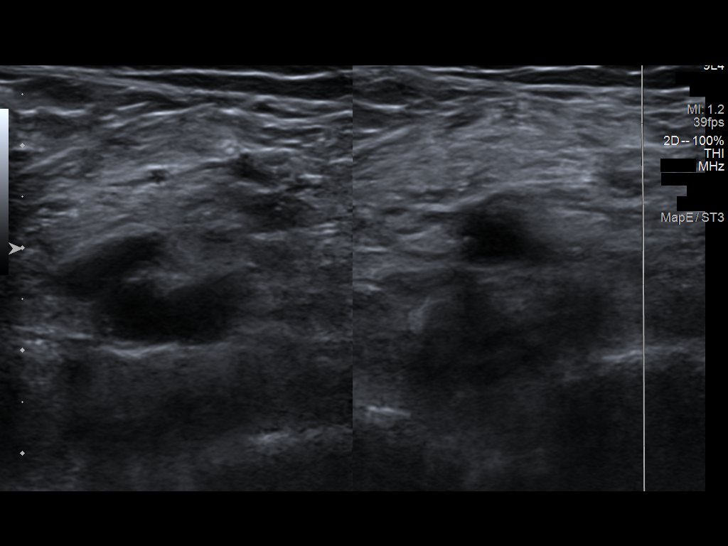
[im 48/48]
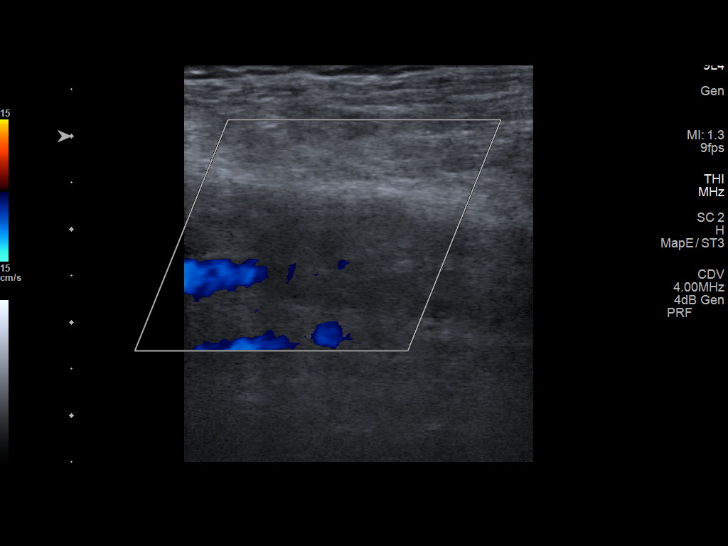

[13 of 24 positions shown; findings below may reference images not displayed]

FINDINGS: Contralateral Common Femoral Vein: Respiratory phasicity is normal
and symmetric with the symptomatic side. No evidence of thrombus.
Normal compressibility.

Common Femoral Vein: No evidence of thrombus. Normal
compressibility, respiratory phasicity and response to augmentation.

Saphenofemoral Junction: No evidence of thrombus. Normal
compressibility and flow on color Doppler imaging.

Profunda Femoral Vein: No evidence of thrombus. Normal
compressibility and flow on color Doppler imaging.

Femoral Vein: No evidence of thrombus. Normal compressibility,
respiratory phasicity and response to augmentation.

Popliteal Vein: No evidence of thrombus. Normal compressibility,
respiratory phasicity and response to augmentation.

Calf Veins: Visualized right deep calf veins are patent without
thrombus.

Superficial Great Saphenous Vein: No evidence of thrombus. Normal
compressibility.

Other Findings:  None.
IMPRESSION: Negative for deep venous thrombosis in right lower extremity.

## 2021-12-02 DIAGNOSIS — R5383 Other fatigue: Secondary | ICD-10-CM | POA: Diagnosis not present

## 2022-01-10 IMAGING — CT CT HEAD W/O CM
4 series · 15 of 47 positions shown, 17 images · non-contrast
Comparison: Head CT 06/24/2019

CLINICAL DATA: Fall today with head and neck pain.  Ataxia.

EXAM:
CT HEAD WITHOUT CONTRAST
CT CERVICAL SPINE WITHOUT CONTRAST
TECHNIQUE: Multidetector CT imaging of the head and cervical spine was
performed following the standard protocol without intravenous
contrast. Multiplanar CT image reconstructions of the cervical spine
were also generated.

[Series 3: head without · axial · non-contrast · 0.42mm/px · z∈[-148,-28]mm · 7 of 33 slices shown, 9 images]
[im 5/33  brain]
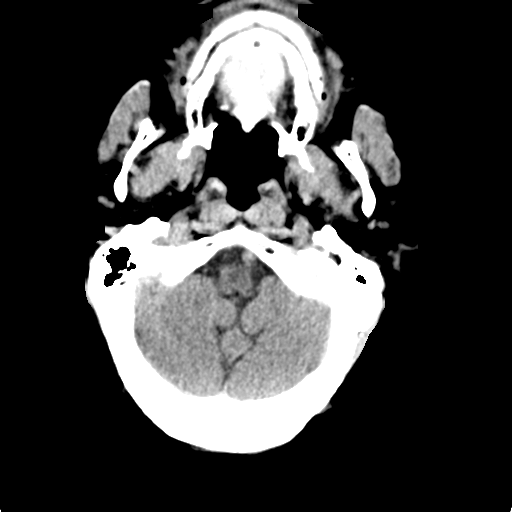
[im 5/33  bone]
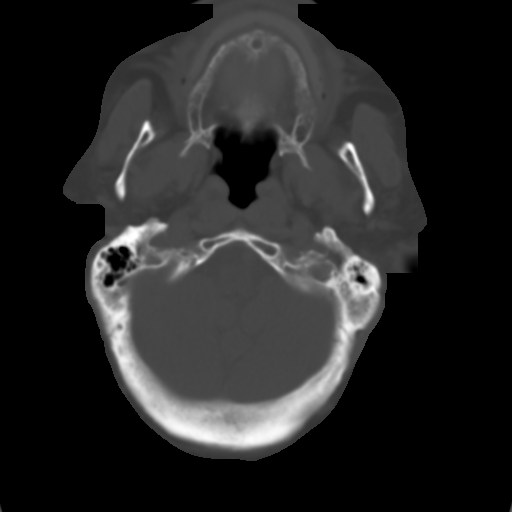
[im 9/33  brain]
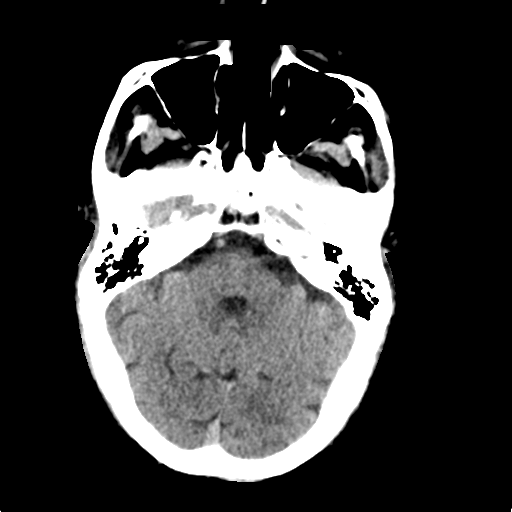
[im 13/33  brain]
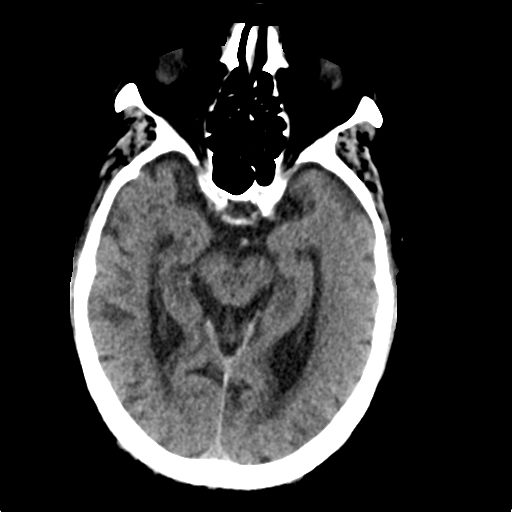
[im 17/33  brain]
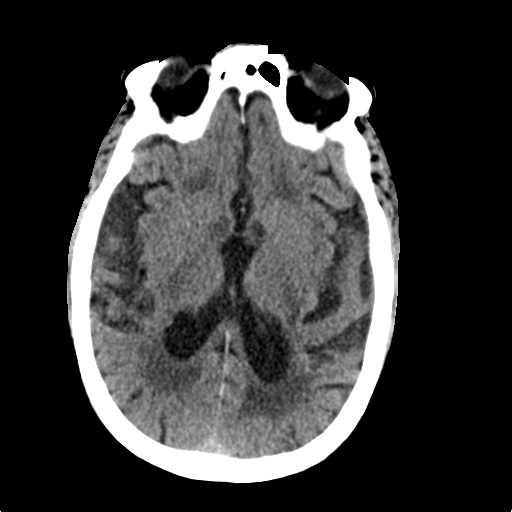
[im 21/33  brain]
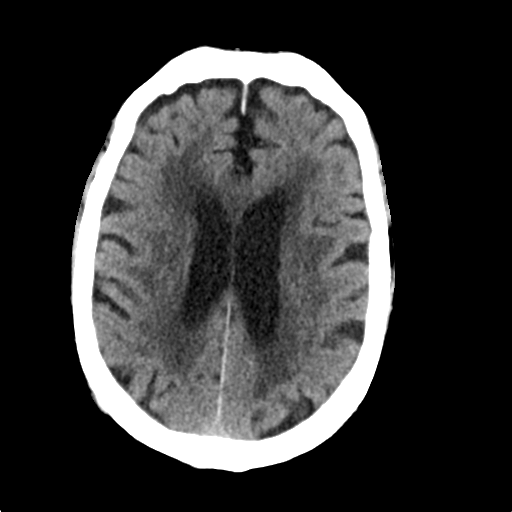
[im 21/33  bone]
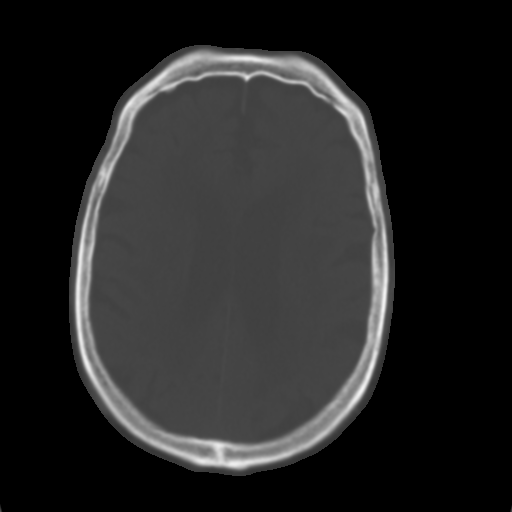
[im 25/33  brain]
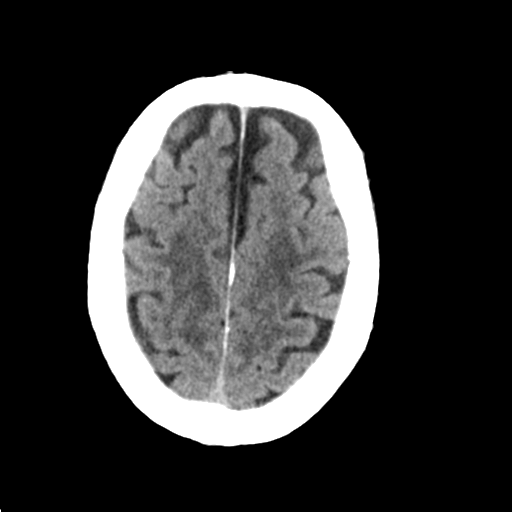
[im 29/33  brain]
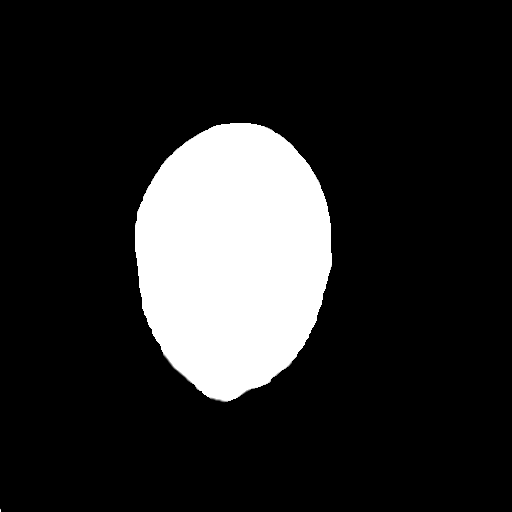

[Series 4: head bone · axial · 0.42mm/px · z∈[-152,-136]mm · 2 of 81 slices shown]
[im 9/81  bone]
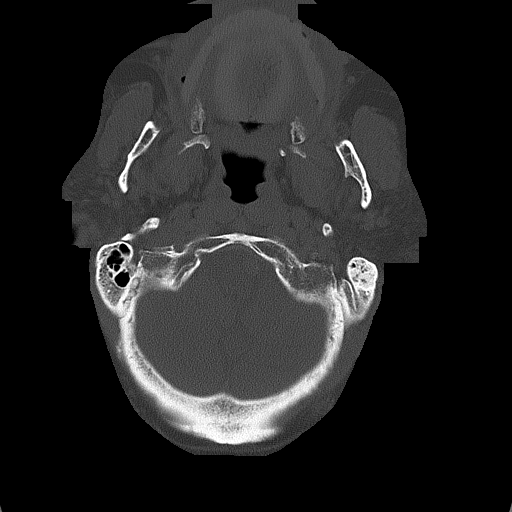
[im 17/81  bone]
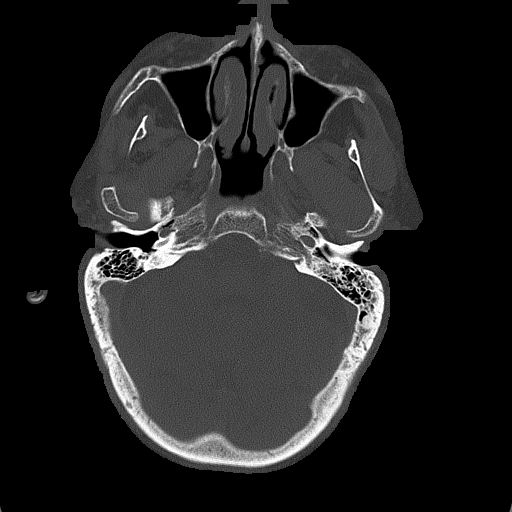

[Series 5: head without cor · coronal · non-contrast · 0.31mm/px · 3 of 68 slices shown]
[im 23/68  brain]
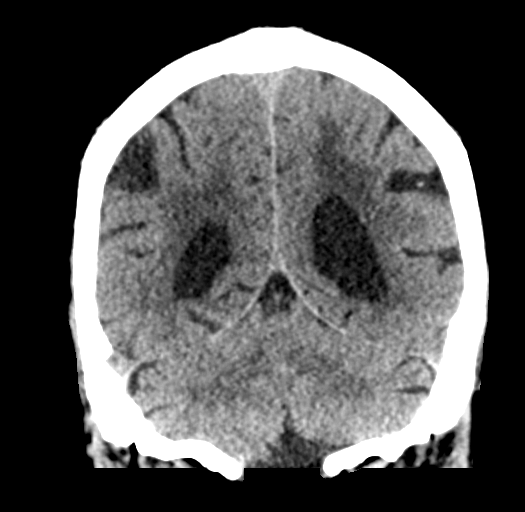
[im 30/68  brain]
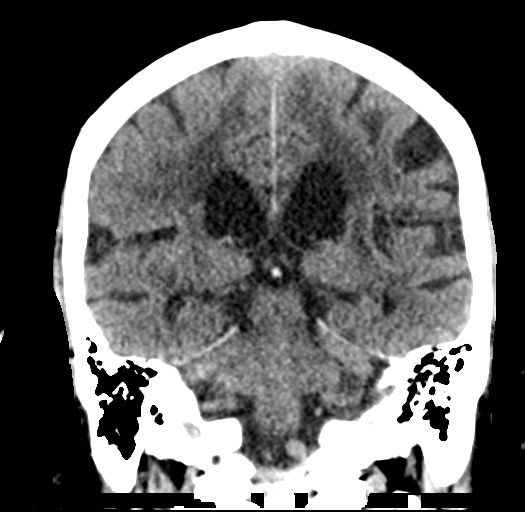
[im 38/68  brain]
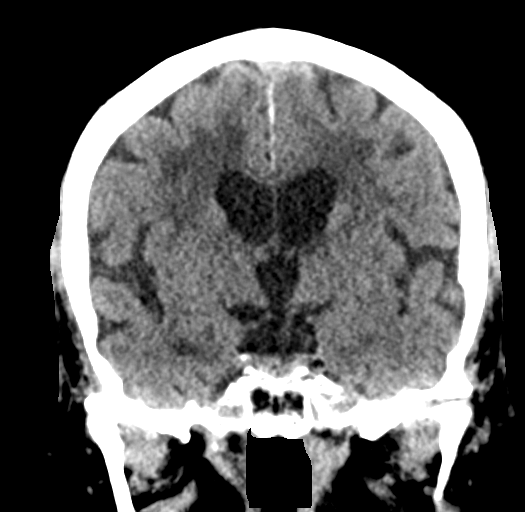

[Series 6: head without sag · sagittal · non-contrast · 0.31mm/px · 3 of 67 slices shown]
[im 23/67  brain]
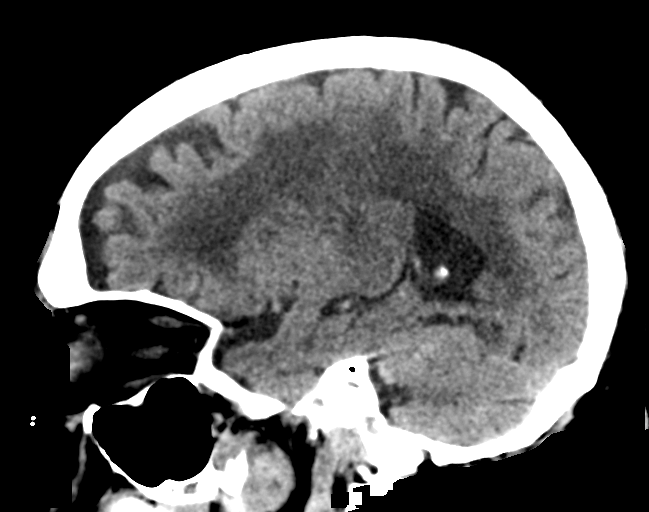
[im 34/67  brain]
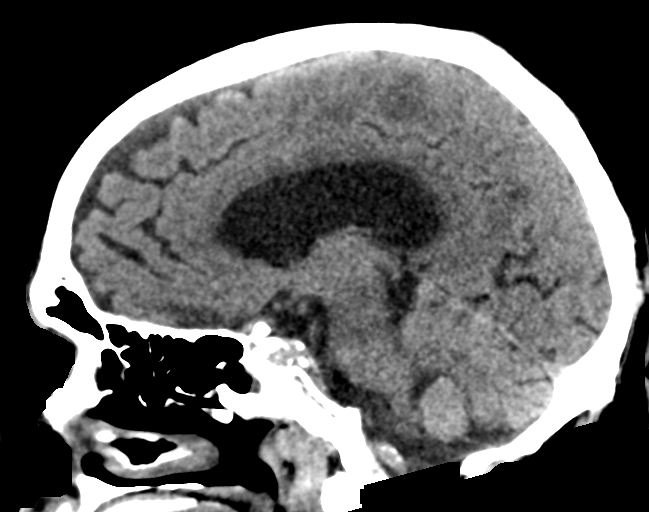
[im 45/67  brain]
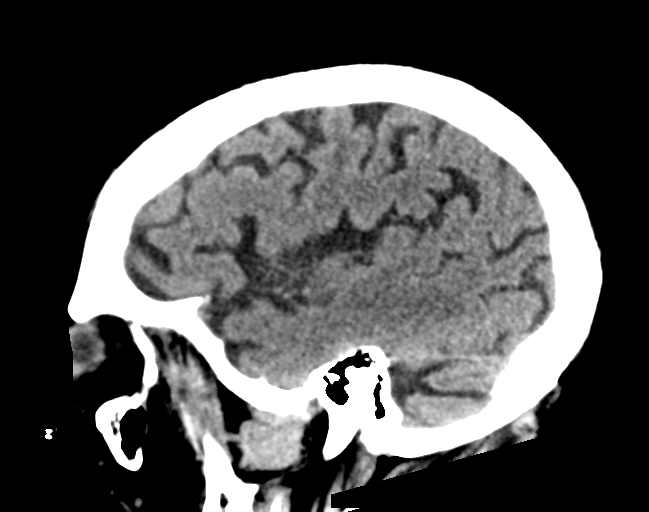

[15 of 47 positions shown; findings below may reference images not displayed]

FINDINGS: CT HEAD FINDINGS

Brain: Ventricles and cisterns are within normal. There is mild
stable prominence of the CSF spaces compatible with atrophic change.
Moderate chronic ischemic microvascular disease is present. Small
old lacunar infarct over the left lentiform nucleus. No evidence of
mass, mass effect, shift of midline structures or acute hemorrhage.
No evidence of acute infarction.

Vascular: No hyperdense vessel or unexpected calcification.

Skull: Normal. Negative for fracture or focal lesion.

Sinuses/Orbits: Orbits are normal. Hypoplastic frontal sinuses.
Opacification over the sphenoid sinus slightly worse. Mastoid air
cells are clear.

Other: None.

CT CERVICAL SPINE FINDINGS

Alignment: Very subtle anterior subluxation of C5 and C6 likely
degenerative in nature. No traumatic subluxation.

Skull base and vertebrae: There is moderate spondylosis throughout
the cervical spine. Vertebral body heights are normal. There is
smooth sclerotic bordered lucency through the base of the dens
separating the dens from the remainder of the C2 vertebral body
compatible with an old fracture. There is uncovertebral joint
spurring and moderate facet arthropathy. There is significant neural
foraminal narrowing at multiple levels worse along the right side at
the C3-4 and C4-5 levels.

Soft tissues and spinal canal: Prevertebral soft tissues are normal.
Spinal canal is unremarkable.

Disc levels:  No significant disc space narrowing.

Upper chest: No acute findings.

Other: None.
IMPRESSION: 1.  No acute brain injury.

2. Chronic ischemic microvascular disease and age related atrophic
change.

3. No acute cervical spine injury. Old ununited base of dens
fracture.

4. Moderate spondylosis throughout the cervical spine with moderate
bilateral neural foraminal narrowing at multiple levels as
described.

5. Chronic inflammatory change of the sphenoid sinus slightly worse.

## 2022-03-02 DIAGNOSIS — T17308A Unspecified foreign body in larynx causing other injury, initial encounter: Secondary | ICD-10-CM | POA: Diagnosis not present

## 2022-03-02 DIAGNOSIS — T17908A Unspecified foreign body in respiratory tract, part unspecified causing other injury, initial encounter: Secondary | ICD-10-CM | POA: Diagnosis not present

## 2022-03-02 DIAGNOSIS — R079 Chest pain, unspecified: Secondary | ICD-10-CM | POA: Diagnosis not present

## 2022-03-02 DIAGNOSIS — R0602 Shortness of breath: Secondary | ICD-10-CM | POA: Diagnosis not present

## 2022-03-02 DIAGNOSIS — R0989 Other specified symptoms and signs involving the circulatory and respiratory systems: Secondary | ICD-10-CM | POA: Diagnosis not present

## 2022-03-02 DIAGNOSIS — R221 Localized swelling, mass and lump, neck: Secondary | ICD-10-CM | POA: Diagnosis not present

## 2022-03-02 DIAGNOSIS — I10 Essential (primary) hypertension: Secondary | ICD-10-CM | POA: Diagnosis not present

## 2022-03-02 DIAGNOSIS — I700 Atherosclerosis of aorta: Secondary | ICD-10-CM | POA: Diagnosis not present

## 2022-03-03 DIAGNOSIS — S72364A Nondisplaced segmental fracture of shaft of right femur, initial encounter for closed fracture: Secondary | ICD-10-CM | POA: Diagnosis not present

## 2022-03-03 DIAGNOSIS — T17908S Unspecified foreign body in respiratory tract, part unspecified causing other injury, sequela: Secondary | ICD-10-CM | POA: Diagnosis not present

## 2022-03-03 DIAGNOSIS — Z8616 Personal history of COVID-19: Secondary | ICD-10-CM | POA: Diagnosis not present

## 2022-03-03 DIAGNOSIS — E538 Deficiency of other specified B group vitamins: Secondary | ICD-10-CM | POA: Diagnosis not present

## 2022-03-03 DIAGNOSIS — F0390 Unspecified dementia without behavioral disturbance: Secondary | ICD-10-CM | POA: Diagnosis not present

## 2022-03-03 DIAGNOSIS — E871 Hypo-osmolality and hyponatremia: Secondary | ICD-10-CM | POA: Diagnosis not present

## 2022-03-03 DIAGNOSIS — I2510 Atherosclerotic heart disease of native coronary artery without angina pectoris: Secondary | ICD-10-CM | POA: Diagnosis not present

## 2022-03-03 DIAGNOSIS — R059 Cough, unspecified: Secondary | ICD-10-CM | POA: Diagnosis not present

## 2022-03-03 DIAGNOSIS — T17308A Unspecified foreign body in larynx causing other injury, initial encounter: Secondary | ICD-10-CM | POA: Diagnosis not present

## 2022-03-03 DIAGNOSIS — J986 Disorders of diaphragm: Secondary | ICD-10-CM | POA: Diagnosis not present

## 2022-03-03 DIAGNOSIS — T17908A Unspecified foreign body in respiratory tract, part unspecified causing other injury, initial encounter: Secondary | ICD-10-CM | POA: Diagnosis not present

## 2022-03-03 DIAGNOSIS — H9193 Unspecified hearing loss, bilateral: Secondary | ICD-10-CM | POA: Diagnosis not present

## 2022-03-03 DIAGNOSIS — R0603 Acute respiratory distress: Secondary | ICD-10-CM | POA: Diagnosis not present

## 2022-03-03 DIAGNOSIS — I10 Essential (primary) hypertension: Secondary | ICD-10-CM | POA: Diagnosis not present

## 2022-03-03 DIAGNOSIS — I080 Rheumatic disorders of both mitral and aortic valves: Secondary | ICD-10-CM | POA: Diagnosis not present

## 2022-03-03 DIAGNOSIS — R918 Other nonspecific abnormal finding of lung field: Secondary | ICD-10-CM | POA: Diagnosis not present

## 2022-03-03 DIAGNOSIS — E860 Dehydration: Secondary | ICD-10-CM | POA: Diagnosis not present

## 2022-03-03 DIAGNOSIS — M960 Pseudarthrosis after fusion or arthrodesis: Secondary | ICD-10-CM | POA: Diagnosis not present

## 2022-03-03 DIAGNOSIS — N182 Chronic kidney disease, stage 2 (mild): Secondary | ICD-10-CM | POA: Diagnosis not present

## 2022-03-03 DIAGNOSIS — H9190 Unspecified hearing loss, unspecified ear: Secondary | ICD-10-CM | POA: Diagnosis not present

## 2022-03-03 DIAGNOSIS — R0602 Shortness of breath: Secondary | ICD-10-CM | POA: Diagnosis not present

## 2022-03-03 DIAGNOSIS — M25561 Pain in right knee: Secondary | ICD-10-CM | POA: Diagnosis not present

## 2022-03-03 DIAGNOSIS — R1312 Dysphagia, oropharyngeal phase: Secondary | ICD-10-CM | POA: Diagnosis not present

## 2022-03-03 DIAGNOSIS — Z7401 Bed confinement status: Secondary | ICD-10-CM | POA: Diagnosis not present

## 2022-03-03 DIAGNOSIS — I517 Cardiomegaly: Secondary | ICD-10-CM | POA: Diagnosis not present

## 2022-03-03 DIAGNOSIS — Z596 Low income: Secondary | ICD-10-CM | POA: Diagnosis not present

## 2022-03-03 DIAGNOSIS — S72401A Unspecified fracture of lower end of right femur, initial encounter for closed fracture: Secondary | ICD-10-CM | POA: Diagnosis not present

## 2022-03-03 DIAGNOSIS — D649 Anemia, unspecified: Secondary | ICD-10-CM | POA: Diagnosis not present

## 2022-03-03 DIAGNOSIS — S7291XA Unspecified fracture of right femur, initial encounter for closed fracture: Secondary | ICD-10-CM | POA: Diagnosis not present

## 2022-03-03 DIAGNOSIS — K219 Gastro-esophageal reflux disease without esophagitis: Secondary | ICD-10-CM | POA: Diagnosis not present

## 2022-03-03 DIAGNOSIS — T17320A Food in larynx causing asphyxiation, initial encounter: Secondary | ICD-10-CM | POA: Diagnosis not present

## 2022-03-03 DIAGNOSIS — R1310 Dysphagia, unspecified: Secondary | ICD-10-CM | POA: Diagnosis not present

## 2022-03-03 DIAGNOSIS — Z7409 Other reduced mobility: Secondary | ICD-10-CM | POA: Diagnosis not present

## 2022-03-03 DIAGNOSIS — J398 Other specified diseases of upper respiratory tract: Secondary | ICD-10-CM | POA: Diagnosis not present

## 2022-03-03 DIAGNOSIS — I129 Hypertensive chronic kidney disease with stage 1 through stage 4 chronic kidney disease, or unspecified chronic kidney disease: Secondary | ICD-10-CM | POA: Diagnosis not present

## 2022-03-03 DIAGNOSIS — T17528A Food in bronchus causing other injury, initial encounter: Secondary | ICD-10-CM | POA: Diagnosis not present

## 2022-03-03 DIAGNOSIS — J690 Pneumonitis due to inhalation of food and vomit: Secondary | ICD-10-CM | POA: Diagnosis not present

## 2022-03-03 DIAGNOSIS — M9701XA Periprosthetic fracture around internal prosthetic right hip joint, initial encounter: Secondary | ICD-10-CM | POA: Diagnosis not present

## 2022-04-07 DIAGNOSIS — L981 Factitial dermatitis: Secondary | ICD-10-CM | POA: Diagnosis not present

## 2022-07-14 DIAGNOSIS — E860 Dehydration: Secondary | ICD-10-CM | POA: Diagnosis not present

## 2022-07-14 DIAGNOSIS — M76811 Anterior tibial syndrome, right leg: Secondary | ICD-10-CM | POA: Diagnosis not present

## 2022-08-28 DIAGNOSIS — M76811 Anterior tibial syndrome, right leg: Secondary | ICD-10-CM | POA: Diagnosis not present

## 2022-09-29 DEATH — deceased
